# Patient Record
Sex: Male | Born: 1955 | Race: White | Hispanic: No | Marital: Married | Smoking: Never smoker
Health system: Southern US, Community
[De-identification: ages and names within clinical notes are randomized; demographics above are authoritative.]

## PROBLEM LIST (undated history)

## (undated) DIAGNOSIS — I700 Atherosclerosis of aorta: Secondary | ICD-10-CM

## (undated) DIAGNOSIS — I10 Essential (primary) hypertension: Secondary | ICD-10-CM

## (undated) DIAGNOSIS — D509 Iron deficiency anemia, unspecified: Secondary | ICD-10-CM

## (undated) DIAGNOSIS — G4730 Sleep apnea, unspecified: Secondary | ICD-10-CM

## (undated) DIAGNOSIS — E785 Hyperlipidemia, unspecified: Secondary | ICD-10-CM

## (undated) DIAGNOSIS — E1169 Type 2 diabetes mellitus with other specified complication: Secondary | ICD-10-CM

## (undated) DIAGNOSIS — M1990 Unspecified osteoarthritis, unspecified site: Secondary | ICD-10-CM

## (undated) DIAGNOSIS — E559 Vitamin D deficiency, unspecified: Secondary | ICD-10-CM

## (undated) DIAGNOSIS — K219 Gastro-esophageal reflux disease without esophagitis: Secondary | ICD-10-CM

## (undated) HISTORY — DX: Essential (primary) hypertension: I10

## (undated) HISTORY — DX: Sleep apnea, unspecified: G47.30

## (undated) HISTORY — DX: Type 2 diabetes mellitus with other specified complication: E78.5

## (undated) HISTORY — DX: Gastro-esophageal reflux disease without esophagitis: K21.9

## (undated) HISTORY — DX: Atherosclerosis of aorta: I70.0

## (undated) HISTORY — DX: Type 2 diabetes mellitus with other specified complication: E11.69

## (undated) HISTORY — PX: REPLACEMENT TOTAL KNEE: SUR1224

## (undated) HISTORY — PX: TOTAL SHOULDER REPLACEMENT: SUR1217

## (undated) HISTORY — PX: TONSILLECTOMY: SUR1361

## (undated) HISTORY — DX: Iron deficiency anemia, unspecified: D50.9

## (undated) HISTORY — DX: Vitamin D deficiency, unspecified: E55.9

## (undated) HISTORY — PX: JOINT REPLACEMENT: SHX530

## (undated) HISTORY — DX: Morbid (severe) obesity due to excess calories: E66.01

## (undated) HISTORY — DX: Unspecified osteoarthritis, unspecified site: M19.90

---

## 2008-09-13 HISTORY — PX: COLONOSCOPY: SHX174

## 2015-09-14 DIAGNOSIS — G459 Transient cerebral ischemic attack, unspecified: Secondary | ICD-10-CM

## 2015-09-14 HISTORY — DX: Transient cerebral ischemic attack, unspecified: G45.9

## 2019-09-24 NOTE — Progress Notes (Signed)
New patient  Assessment and Plan:  Screen for colon cancer -     Ambulatory referral to Gastroenterology- Internal [place] - patient is over due  Morbid obesity ([place]) - follow up 3 months for progress monitoring - increase veggies, decrease carbs - long discussion about weight loss, diet, and exercise  Type 2 diabetes mellitus with hyperlipidemia (HCC) -     Semaglutide, 1 MG/DOSE, (OZEMPIC, 1 MG/DOSE,) 2 MG/1.5ML SOPN; Inject 1 mg into the skin once a week. Inject into the skin. -     Lipid Profile -     Hemoglobin A1c ([facility]) - will stop the glyburide, start the ozempic  Vitamin D deficiency -     Vitamin D (25 hydroxy)  Gastroesophageal reflux disease with esophagitis without hemorrhage Cut back on meloxicam Weight loss Continue PPI/H2 blocker, diet discussed  Sleep apnea, unspecified type ? Need to try to get mouth piece Weight loss advised  Essential hypertension - continue medications, DASH diet, exercise and monitor at home. Call if greater than 130/80.  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 -     mupirocin cream (BACTROBAN) 2 %; Apply to affected area 3 times daily -     doxycycline (VIBRAMYCIN) 100 MG capsule; Take 1 capsule twice daily with food - with possible secondary infection versus lipodermatosclerosis- will treat as infection- may need ABI/US- patient thinks he  has had something like that will go through paper chart  Chronic right shoulder pain Follows with ortho tomorrow  Follow up for CPE  Discussed med's effects and SE's. Screening labs and tests as requested with regular follow-up as recommended. Over 40 minutes of exam, counseling, chart review and critical decision making was performed  HPI Patient presents to establish as new patient at this practice.  He moved here from [place] with his wife to be closer to his daughter who is a patient here, Mariam Dollar, just adopted twins.   He had right shoulder replacement 2019 in [place], having pain now, has follow up with ortho .   BMI is Body mass index is 47.06 kg/m., he is working on diet and exercise. He has OSA and has tried CPAP and BiPAP, has not been able to have one due to feeling of suffocation. He has edema, has had echo, normal.  Wt Readings from Last 3 Encounters:  09/27/19 (!) 347 lb (157.4 kg)   His blood pressure has been controlled at home, today their BP is BP: 130/74 He does not workout. He denies chest pain, shortness of breath, dizziness.   He is on cholesterol medication and denies myalgias. He has been working on diet and exercise for diabetes, With CKD stage 2 on plavix  With hyperlipidemia on lipitor 80mg  He does not check his sugars.  He is on metformin 1000mg  BID Glyburide 2.5mg  ozmimpic 0.5 mg started due to be able to have his shoulder  insurance.  Glyburide 0.5mg  once.   he is not on bASA Carotid doppler 04/2019 less than 50% Retinal hemorrhages due to DM, due annual exam due 03/2020- 03/2019    Current Medications:  Current Outpatient Medications on File Prior to Visit  Medication Sig Dispense Refill  . atorvastatin (LIPITOR) 80 MG tablet Take 80 mg by mouth.    . Cholecalciferol 125 MCG (5000 UT) TABS Take 5,000 Units by mouth.    . clopidogrel (PLAVIX) 75 MG tablet Take 75 mg by mouth.    . furosemide (LASIX) 40 MG tablet 40 mg.    . meloxicam (MOBIC) 15  MG tablet Take 15 mg by mouth.    . metFORMIN (GLUCOPHAGE) 1000 MG tablet Take 1,000 mg by mouth.    Marland Kitchen omeprazole (PRILOSEC) 40 MG capsule Take 40 mg by mouth.    . valsartan (DIOVAN) 160 MG tablet Take 160 mg by mouth.     No current facility-administered medications on file prior to visit.   Allergies:  Allergies  Allergen Reactions  . 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 Surgical History:  He  has a past surgical history that includes Replacement total knee (Left); Total shoulder replacement (Right); Tonsillectomy; and Colonoscopy (2010). Family History:  His family history is not on file.  Will put in later  Social History:   reports that he has never smoked. He has never used smokeless tobacco. He reports that he does not use drugs. No history on file for alcohol. Review of Systems:  Review of Systems  Constitutional: Negative.   HENT: Negative.   Eyes: Negative.   Respiratory: Positive for shortness of breath (with walking due to body habitus). Negative for cough, hemoptysis, sputum production and wheezing.   Cardiovascular: Positive for claudication and leg swelling. Negative for chest pain, palpitations, orthopnea and PND.  Gastrointestinal: Positive for constipation, diarrhea and heartburn (has hiatal hernia). Negative for abdominal pain, blood in stool, melena, nausea and vomiting.  Genitourinary: Negative.   Musculoskeletal: Positive for back pain,  joint pain and myalgias. Negative for falls and neck pain.  Skin: Positive for rash.  Neurological: Negative.   Endo/Heme/Allergies: Negative.   Psychiatric/Behavioral: Negative.     Physical Exam: Estimated body mass index is 47.06 kg/m as calculated from the following:   Height as of this encounter: 6' (1.829 m).   Weight as of this encounter: 347 lb (157.4 kg). BP 130/74   Pulse (!) 55   Temp 97.7 F (36.5 C)   Ht 6' (1.829 m)   Wt (!) 347 lb (157.4 kg)   SpO2 97%   BMI 47.06 kg/m  General Appearance: Obese WM, in no apparent distress.  Eyes: PERRLA, EOMs, conjunctiva no swelling or erythema, normal fundi and vessels.  Sinuses: No Frontal/maxillary tenderness  ENT/Mouth: Ext aud canals clear, normal light reflex with TMs without erythema, bulging. Mouth and nose not examined- patient wearing a facemask. Hearing normal.  Neck: Supple, thyroid normal. No bruits  Respiratory: Respiratory effort normal, BS decreased due to body habitus but equal bilaterally without rales, rhonchi, wheezing  or stridor.  Cardio: RRR without murmurs, rubs or gallops. Brisk DP but 1+ or barely palpable TP, 1-2+ edema with warm, distinct raised erythematous/bluish papules circumflexing bilateral legs.  Chest: symmetric, with normal excursions and percussion.  Abdomen: Soft, obese, nontender, no guarding, rebound, hernias, masses, or organomegaly.  Lymphatics: Non tender without lymphadenopathy.  Genitourinary: defer Musculoskeletal: Full ROM all peripheral extremities,antalgic gait, decreased movement right shoulder due to pain.  Skin: Warm, dry. Neuro: Cranial nerves intact, reflexes equal bilaterally. Normal muscle tone, no cerebellar symptoms. Sensation intact bilateral feet without ulcers. Marland Kitchen  Psych: Awake and oriented X 3, normal affect, Insight and Judgment appropriate.    Vicie Mutters 1:34 PM [facility]

## 2019-09-27 ENCOUNTER — Other Ambulatory Visit: Payer: Self-pay

## 2019-09-27 ENCOUNTER — Ambulatory Visit (INDEPENDENT_AMBULATORY_CARE_PROVIDER_SITE_OTHER): Payer: 59 | Admitting: Physician Assistant

## 2019-09-27 ENCOUNTER — Encounter: Payer: Self-pay | Admitting: Physician Assistant

## 2019-09-27 VITALS — BP 130/74 | HR 55 | Temp 97.7°F | Ht 72.0 in | Wt 347.0 lb

## 2019-09-27 DIAGNOSIS — G8929 Other chronic pain: Secondary | ICD-10-CM

## 2019-09-27 DIAGNOSIS — E1169 Type 2 diabetes mellitus with other specified complication: Secondary | ICD-10-CM

## 2019-09-27 DIAGNOSIS — E785 Hyperlipidemia, unspecified: Secondary | ICD-10-CM | POA: Insufficient documentation

## 2019-09-27 DIAGNOSIS — I152 Hypertension secondary to endocrine disorders: Secondary | ICD-10-CM | POA: Insufficient documentation

## 2019-09-27 DIAGNOSIS — G4730 Sleep apnea, unspecified: Secondary | ICD-10-CM

## 2019-09-27 DIAGNOSIS — E1122 Type 2 diabetes mellitus with diabetic chronic kidney disease: Secondary | ICD-10-CM | POA: Insufficient documentation

## 2019-09-27 DIAGNOSIS — Z79899 Other long term (current) drug therapy: Secondary | ICD-10-CM | POA: Insufficient documentation

## 2019-09-27 DIAGNOSIS — N182 Chronic kidney disease, stage 2 (mild): Secondary | ICD-10-CM | POA: Insufficient documentation

## 2019-09-27 DIAGNOSIS — K219 Gastro-esophageal reflux disease without esophagitis: Secondary | ICD-10-CM | POA: Insufficient documentation

## 2019-09-27 DIAGNOSIS — I10 Essential (primary) hypertension: Secondary | ICD-10-CM | POA: Diagnosis not present

## 2019-09-27 DIAGNOSIS — E1159 Type 2 diabetes mellitus with other circulatory complications: Secondary | ICD-10-CM | POA: Insufficient documentation

## 2019-09-27 DIAGNOSIS — G4733 Obstructive sleep apnea (adult) (pediatric): Secondary | ICD-10-CM | POA: Insufficient documentation

## 2019-09-27 DIAGNOSIS — Z1211 Encounter for screening for malignant neoplasm of colon: Secondary | ICD-10-CM

## 2019-09-27 DIAGNOSIS — K2100 Gastro-esophageal reflux disease with esophagitis, without bleeding: Secondary | ICD-10-CM | POA: Diagnosis not present

## 2019-09-27 DIAGNOSIS — E559 Vitamin D deficiency, unspecified: Secondary | ICD-10-CM

## 2019-09-27 DIAGNOSIS — E0822 Diabetes mellitus due to underlying condition with diabetic chronic kidney disease: Secondary | ICD-10-CM

## 2019-09-27 DIAGNOSIS — I872 Venous insufficiency (chronic) (peripheral): Secondary | ICD-10-CM | POA: Insufficient documentation

## 2019-09-27 DIAGNOSIS — M25511 Pain in right shoulder: Secondary | ICD-10-CM

## 2019-09-27 DIAGNOSIS — Z794 Long term (current) use of insulin: Secondary | ICD-10-CM

## 2019-09-27 MED ORDER — MUPIROCIN CALCIUM 2 % EX CREA: TOPICAL_CREAM | CUTANEOUS | 1 refills | Status: DC

## 2019-09-27 MED ORDER — OZEMPIC (1 MG/DOSE) 2 MG/1.5ML ~~LOC~~ SOPN: 1.0000 mg | PEN_INJECTOR | SUBCUTANEOUS | 3 refills | Status: DC

## 2019-09-27 MED ORDER — TRIAMCINOLONE ACETONIDE 0.1 % EX OINT: 1.0000 "application " | TOPICAL_OINTMENT | Freq: Two times a day (BID) | CUTANEOUS | 1 refills | Status: DC

## 2019-09-27 MED ORDER — DOXYCYCLINE HYCLATE 100 MG PO CAPS: ORAL_CAPSULE | ORAL | 0 refills | Status: DC

## 2019-09-27 NOTE — Patient Instructions (Signed)
SGLT-2  CI/SE avoid in A1C 9% - increased risk of UTIs/mycotic infections   Farxiga -  only one with primary and secondary CHF prevention  Reduced hospitalizations - 10 mg dose only, NOT 5 mg Decreased cardiovascular death, secondary CVD DM GFR <45 NO, ok for GFR 30 if NOT DM Free commercial card, applies to deductible   Jardiance -  2ndary CVD death reduced 38% with established T2DM and CVD Pending CHF indication approval  Improved coverage with commercial medicare - 97% coverage - tier 2 or 3 with medicare advantage plans Discontinue GFR <45%  Invokana -  Lowers risk of MI, CVD, CVA, death in T2DM with established vascular disease  Reduces CKD progression in CKD with albuminuria 300 GFR 30-60 100 mg daily dose, NOT 300 mg ^ risk of amputations  Steglatro - No CVD data but cheaper and applies to deductible    GLP-1

## 2019-09-28 LAB — TSH: TSH: 0.99 mIU/L (ref 0.40–4.50)

## 2019-09-28 LAB — CBC WITH DIFFERENTIAL/PLATELET
Absolute Monocytes: 661 cells/uL (ref 200–950)
Basophils Absolute: 70 cells/uL (ref 0–200)
Basophils Relative: 0.8 %
Eosinophils Absolute: 278 cells/uL (ref 15–500)
Eosinophils Relative: 3.2 %
HCT: 40 % (ref 38.5–50.0)
Hemoglobin: 12.7 g/dL — ABNORMAL LOW (ref 13.2–17.1)
Lymphs Abs: 1714 cells/uL (ref 850–3900)
MCH: 23.9 pg — ABNORMAL LOW (ref 27.0–33.0)
MCHC: 31.8 g/dL — ABNORMAL LOW (ref 32.0–36.0)
MCV: 75.2 fL — ABNORMAL LOW (ref 80.0–100.0)
MPV: 9.2 fL (ref 7.5–12.5)
Monocytes Relative: 7.6 %
Neutro Abs: 5977 cells/uL (ref 1500–7800)
Neutrophils Relative %: 68.7 %
Platelets: 349 10*3/uL (ref 140–400)
RBC: 5.32 10*6/uL (ref 4.20–5.80)
RDW: 15.5 % — ABNORMAL HIGH (ref 11.0–15.0)
Total Lymphocyte: 19.7 %
WBC: 8.7 10*3/uL (ref 3.8–10.8)

## 2019-09-28 LAB — COMPLETE METABOLIC PANEL WITH GFR
AG Ratio: 1.6 (calc) (ref 1.0–2.5)
ALT: 24 U/L (ref 9–46)
AST: 22 U/L (ref 10–35)
Albumin: 4.5 g/dL (ref 3.6–5.1)
Alkaline phosphatase (APISO): 90 U/L (ref 35–144)
BUN: 24 mg/dL (ref 7–25)
CO2: 28 mmol/L (ref 20–32)
Calcium: 9.8 mg/dL (ref 8.6–10.3)
Chloride: 102 mmol/L (ref 98–110)
Creat: 1.14 mg/dL (ref 0.70–1.25)
GFR, Est African American: 79 mL/min/{1.73_m2} (ref >=60)
GFR, Est Non African American: 68 mL/min/{1.73_m2} (ref >=60)
Globulin: 2.8 g/dL (calc) (ref 1.9–3.7)
Glucose, Bld: 82 mg/dL (ref 65–99)
Potassium: 5 mmol/L (ref 3.5–5.3)
Sodium: 139 mmol/L (ref 135–146)
Total Bilirubin: 0.4 mg/dL (ref 0.2–1.2)
Total Protein: 7.3 g/dL (ref 6.1–8.1)

## 2019-09-28 LAB — LIPID PANEL
Cholesterol: 162 mg/dL (ref <200)
HDL: 35 mg/dL — ABNORMAL LOW (ref >=40)
LDL Cholesterol (Calc): 94 mg/dL (calc)
Non-HDL Cholesterol (Calc): 127 mg/dL (calc) (ref <130)
Total CHOL/HDL Ratio: 4.6 (calc) (ref <5.0)
Triglycerides: 237 mg/dL — ABNORMAL HIGH (ref <150)

## 2019-09-28 LAB — HEMOGLOBIN A1C
Hgb A1c MFr Bld: 6.8 % of total Hgb — ABNORMAL HIGH (ref <5.7)
Mean Plasma Glucose: 148 (calc)
eAG (mmol/L): 8.2 (calc)

## 2019-09-28 LAB — VITAMIN D 25 HYDROXY (VIT D DEFICIENCY, FRACTURES): Vit D, 25-Hydroxy: 29 ng/mL — ABNORMAL LOW (ref 30–100)

## 2019-09-28 LAB — MAGNESIUM: Magnesium: 2.1 mg/dL (ref 1.5–2.5)

## 2019-09-30 ENCOUNTER — Other Ambulatory Visit: Payer: Self-pay | Admitting: Physician Assistant

## 2019-09-30 MED ORDER — EZETIMIBE 10 MG PO TABS: 10.0000 mg | ORAL_TABLET | Freq: Every day | ORAL | 1 refills | Status: DC

## 2019-11-02 ENCOUNTER — Telehealth: Payer: Self-pay | Admitting: Physician Assistant

## 2019-11-02 MED ORDER — CARVEDILOL 12.5 MG PO TABS: 12.5000 mg | ORAL_TABLET | Freq: Two times a day (BID) | ORAL | 1 refills | Status: DC

## 2019-11-02 NOTE — Telephone Encounter (Signed)
-----   Message from Elenor Quinones, [place] sent at 11/02/2019  9:32 AM EST ----- Regarding: pharmacy note Contact: [phone] Pt has requested an rx for CARVEDILOL   Dustin Ayala

## 2019-11-13 ENCOUNTER — Other Ambulatory Visit: Payer: Self-pay | Admitting: Physician Assistant

## 2019-11-13 DIAGNOSIS — E1169 Type 2 diabetes mellitus with other specified complication: Secondary | ICD-10-CM

## 2019-11-13 DIAGNOSIS — I872 Venous insufficiency (chronic) (peripheral): Secondary | ICD-10-CM

## 2019-11-13 DIAGNOSIS — I10 Essential (primary) hypertension: Secondary | ICD-10-CM

## 2019-11-13 DIAGNOSIS — E0822 Diabetes mellitus due to underlying condition with diabetic chronic kidney disease: Secondary | ICD-10-CM

## 2019-11-13 DIAGNOSIS — N182 Chronic kidney disease, stage 2 (mild): Secondary | ICD-10-CM

## 2019-11-13 DIAGNOSIS — Z794 Long term (current) use of insulin: Secondary | ICD-10-CM

## 2019-11-13 DIAGNOSIS — E785 Hyperlipidemia, unspecified: Secondary | ICD-10-CM

## 2019-11-15 ENCOUNTER — Emergency Department (HOSPITAL_COMMUNITY): Payer: 59

## 2019-11-15 ENCOUNTER — Observation Stay (HOSPITAL_COMMUNITY)
Admission: EM | Admit: 2019-11-15 | Discharge: 2019-11-16 | Disposition: A | Discharge status: Home / Self Care | Payer: 59 | Attending: Family Medicine | Admitting: Family Medicine

## 2019-11-15 ENCOUNTER — Other Ambulatory Visit: Payer: Self-pay

## 2019-11-15 ENCOUNTER — Encounter (HOSPITAL_COMMUNITY): Payer: Self-pay

## 2019-11-15 DIAGNOSIS — K611 Rectal abscess: Principal | ICD-10-CM

## 2019-11-15 DIAGNOSIS — E785 Hyperlipidemia, unspecified: Secondary | ICD-10-CM | POA: Diagnosis present

## 2019-11-15 DIAGNOSIS — K6289 Other specified diseases of anus and rectum: Secondary | ICD-10-CM | POA: Diagnosis present

## 2019-11-15 DIAGNOSIS — I10 Essential (primary) hypertension: Secondary | ICD-10-CM | POA: Diagnosis not present

## 2019-11-15 DIAGNOSIS — Z20822 Contact with and (suspected) exposure to covid-19: Secondary | ICD-10-CM | POA: Insufficient documentation

## 2019-11-15 DIAGNOSIS — R195 Other fecal abnormalities: Secondary | ICD-10-CM | POA: Diagnosis present

## 2019-11-15 DIAGNOSIS — Z79899 Other long term (current) drug therapy: Secondary | ICD-10-CM | POA: Diagnosis not present

## 2019-11-15 DIAGNOSIS — D72829 Elevated white blood cell count, unspecified: Secondary | ICD-10-CM

## 2019-11-15 DIAGNOSIS — E0822 Diabetes mellitus due to underlying condition with diabetic chronic kidney disease: Secondary | ICD-10-CM | POA: Diagnosis not present

## 2019-11-15 DIAGNOSIS — D509 Iron deficiency anemia, unspecified: Secondary | ICD-10-CM

## 2019-11-15 DIAGNOSIS — I872 Venous insufficiency (chronic) (peripheral): Secondary | ICD-10-CM | POA: Diagnosis present

## 2019-11-15 DIAGNOSIS — N182 Chronic kidney disease, stage 2 (mild): Secondary | ICD-10-CM

## 2019-11-15 DIAGNOSIS — K219 Gastro-esophageal reflux disease without esophagitis: Secondary | ICD-10-CM | POA: Diagnosis present

## 2019-11-15 DIAGNOSIS — E1159 Type 2 diabetes mellitus with other circulatory complications: Secondary | ICD-10-CM | POA: Diagnosis present

## 2019-11-15 DIAGNOSIS — Z7984 Long term (current) use of oral hypoglycemic drugs: Secondary | ICD-10-CM | POA: Diagnosis not present

## 2019-11-15 DIAGNOSIS — K760 Fatty (change of) liver, not elsewhere classified: Secondary | ICD-10-CM

## 2019-11-15 DIAGNOSIS — E1169 Type 2 diabetes mellitus with other specified complication: Secondary | ICD-10-CM | POA: Diagnosis present

## 2019-11-15 DIAGNOSIS — R160 Hepatomegaly, not elsewhere classified: Secondary | ICD-10-CM

## 2019-11-15 DIAGNOSIS — E1122 Type 2 diabetes mellitus with diabetic chronic kidney disease: Secondary | ICD-10-CM

## 2019-11-15 DIAGNOSIS — L0231 Cutaneous abscess of buttock: Secondary | ICD-10-CM | POA: Diagnosis present

## 2019-11-15 HISTORY — DX: Fatty (change of) liver, not elsewhere classified: K76.0

## 2019-11-15 HISTORY — DX: Hepatomegaly, not elsewhere classified: R16.0

## 2019-11-15 LAB — CBC WITH DIFFERENTIAL/PLATELET
Abs Immature Granulocytes: 0.07 10*3/uL (ref 0.00–0.07)
Basophils Absolute: 0.1 10*3/uL (ref 0.0–0.1)
Basophils Relative: 0 %
Eosinophils Absolute: 0.1 10*3/uL (ref 0.0–0.5)
Eosinophils Relative: 1 %
HCT: 38.2 % — ABNORMAL LOW (ref 39.0–52.0)
Hemoglobin: 11.9 g/dL — ABNORMAL LOW (ref 13.0–17.0)
Immature Granulocytes: 0 %
Lymphocytes Relative: 9 %
Lymphs Abs: 1.5 10*3/uL (ref 0.7–4.0)
MCH: 24.1 pg — ABNORMAL LOW (ref 26.0–34.0)
MCHC: 31.2 g/dL (ref 30.0–36.0)
MCV: 77.5 fL — ABNORMAL LOW (ref 80.0–100.0)
Monocytes Absolute: 1.5 10*3/uL — ABNORMAL HIGH (ref 0.1–1.0)
Monocytes Relative: 9 %
Neutro Abs: 12.8 10*3/uL — ABNORMAL HIGH (ref 1.7–7.7)
Neutrophils Relative %: 81 %
Platelets: 311 10*3/uL (ref 150–400)
RBC: 4.93 MIL/uL (ref 4.22–5.81)
RDW: 16.1 % — ABNORMAL HIGH (ref 11.5–15.5)
WBC: 16.1 10*3/uL — ABNORMAL HIGH (ref 4.0–10.5)
nRBC: 0 % (ref 0.0–0.2)

## 2019-11-15 LAB — URINALYSIS, ROUTINE W REFLEX MICROSCOPIC
Bilirubin Urine: NEGATIVE
Glucose, UA: NEGATIVE mg/dL
Hgb urine dipstick: NEGATIVE
Ketones, ur: NEGATIVE mg/dL
Leukocytes,Ua: NEGATIVE
Nitrite: NEGATIVE
Protein, ur: NEGATIVE mg/dL
Specific Gravity, Urine: 1.02 (ref 1.005–1.030)
pH: 6 (ref 5.0–8.0)

## 2019-11-15 LAB — COMPREHENSIVE METABOLIC PANEL
ALT: 22 U/L (ref 0–44)
AST: 15 U/L (ref 15–41)
Albumin: 3.6 g/dL (ref 3.5–5.0)
Alkaline Phosphatase: 84 U/L (ref 38–126)
Anion gap: 11 (ref 5–15)
BUN: 23 mg/dL (ref 8–23)
CO2: 23 mmol/L (ref 22–32)
Calcium: 8.8 mg/dL — ABNORMAL LOW (ref 8.9–10.3)
Chloride: 103 mmol/L (ref 98–111)
Creatinine, Ser: 1.05 mg/dL (ref 0.61–1.24)
GFR calc Af Amer: >60 mL/min (ref >60)
GFR calc non Af Amer: >60 mL/min (ref >60)
Glucose, Bld: 114 mg/dL — ABNORMAL HIGH (ref 70–99)
Potassium: 4 mmol/L (ref 3.5–5.1)
Sodium: 137 mmol/L (ref 135–145)
Total Bilirubin: 0.9 mg/dL (ref 0.3–1.2)
Total Protein: 7.4 g/dL (ref 6.5–8.1)

## 2019-11-15 LAB — GLUCOSE, CAPILLARY: Glucose-Capillary: 127 mg/dL — ABNORMAL HIGH (ref 70–99)

## 2019-11-15 LAB — IRON AND TIBC
Iron: 27 ug/dL — ABNORMAL LOW (ref 45–182)
Saturation Ratios: 7 % — ABNORMAL LOW (ref 17.9–39.5)
TIBC: 388 ug/dL (ref 250–450)
UIBC: 361 ug/dL

## 2019-11-15 LAB — POC OCCULT BLOOD, ED: Fecal Occult Bld: POSITIVE — AB

## 2019-11-15 LAB — LIPASE, BLOOD: Lipase: 27 U/L (ref 11–51)

## 2019-11-15 LAB — FERRITIN: Ferritin: 69 ng/mL (ref 24–336)

## 2019-11-15 LAB — LACTIC ACID, PLASMA: Lactic Acid, Venous: 1.4 mmol/L (ref 0.5–1.9)

## 2019-11-15 LAB — SARS CORONAVIRUS 2 (TAT 6-24 HRS): SARS Coronavirus 2: NEGATIVE

## 2019-11-15 MED ORDER — EZETIMIBE 10 MG PO TABS
10.0000 mg | ORAL_TABLET | Freq: Every day | ORAL | Status: DC
  Administered 2019-11-16: 10 mg via ORAL
  Filled 2019-11-15: qty 1

## 2019-11-15 MED ORDER — METRONIDAZOLE 500 MG PO TABS
500.0000 mg | ORAL_TABLET | Freq: Three times a day (TID) | ORAL | Status: DC
  Administered 2019-11-15 – 2019-11-16 (×2): 500 mg via ORAL
  Filled 2019-11-15 (×3): qty 1

## 2019-11-15 MED ORDER — AMOXICILLIN-POT CLAVULANATE 875-125 MG PO TABS: 1.0000 | ORAL_TABLET | Freq: Two times a day (BID) | ORAL | Status: DC

## 2019-11-15 MED ORDER — TRAMADOL HCL 50 MG PO TABS
50.0000 mg | ORAL_TABLET | Freq: Four times a day (QID) | ORAL | Status: DC | PRN
  Administered 2019-11-15 – 2019-11-16 (×2): 50 mg via ORAL
  Filled 2019-11-15 (×3): qty 1

## 2019-11-15 MED ORDER — HEPARIN SODIUM (PORCINE) 5000 UNIT/ML IJ SOLN
5000.0000 [IU] | Freq: Three times a day (TID) | INTRAMUSCULAR | Status: DC
  Filled 2019-11-15: qty 1

## 2019-11-15 MED ORDER — FENTANYL CITRATE (PF) 100 MCG/2ML IJ SOLN
25.0000 ug | Freq: Once | INTRAMUSCULAR | Status: AC
  Administered 2019-11-15: 25 ug via INTRAVENOUS
  Filled 2019-11-15: qty 2

## 2019-11-15 MED ORDER — PIPERACILLIN-TAZOBACTAM 3.375 G IVPB 30 MIN
3.3750 g | Freq: Once | INTRAVENOUS | Status: AC
  Administered 2019-11-15: 3.375 g via INTRAVENOUS
  Filled 2019-11-15: qty 50

## 2019-11-15 MED ORDER — IRBESARTAN 150 MG PO TABS
150.0000 mg | ORAL_TABLET | Freq: Every day | ORAL | Status: DC
  Administered 2019-11-15 – 2019-11-16 (×2): 150 mg via ORAL
  Filled 2019-11-15 (×2): qty 1

## 2019-11-15 MED ORDER — DOXYCYCLINE HYCLATE 100 MG PO TABS
100.0000 mg | ORAL_TABLET | Freq: Two times a day (BID) | ORAL | Status: DC
  Administered 2019-11-15 – 2019-11-16 (×2): 100 mg via ORAL
  Filled 2019-11-15 (×2): qty 1

## 2019-11-15 MED ORDER — ONDANSETRON HCL 4 MG PO TABS: 4.0000 mg | ORAL_TABLET | Freq: Four times a day (QID) | ORAL | Status: DC | PRN

## 2019-11-15 MED ORDER — ACETAMINOPHEN 650 MG RE SUPP: 650.0000 mg | Freq: Four times a day (QID) | RECTAL | Status: DC | PRN

## 2019-11-15 MED ORDER — CARVEDILOL 25 MG PO TABS
25.0000 mg | ORAL_TABLET | Freq: Two times a day (BID) | ORAL | Status: DC
  Administered 2019-11-15 – 2019-11-16 (×2): 25 mg via ORAL
  Filled 2019-11-15 (×2): qty 1

## 2019-11-15 MED ORDER — TRIAMCINOLONE ACETONIDE 0.1 % EX OINT: 1.0000 "application " | TOPICAL_OINTMENT | Freq: Every day | CUTANEOUS | Status: DC | PRN

## 2019-11-15 MED ORDER — SODIUM CHLORIDE (PF) 0.9 % IJ SOLN
INTRAMUSCULAR | Status: AC
  Filled 2019-11-15: qty 50

## 2019-11-15 MED ORDER — SODIUM CHLORIDE 0.9 % IV BOLUS
1000.0000 mL | Freq: Once | INTRAVENOUS | Status: AC
  Administered 2019-11-15: 1000 mL via INTRAVENOUS

## 2019-11-15 MED ORDER — CLOPIDOGREL BISULFATE 75 MG PO TABS
75.0000 mg | ORAL_TABLET | Freq: Every day | ORAL | Status: DC
  Administered 2019-11-15 – 2019-11-16 (×2): 75 mg via ORAL
  Filled 2019-11-15 (×2): qty 1

## 2019-11-15 MED ORDER — ONDANSETRON HCL 4 MG/2ML IJ SOLN: 4.0000 mg | Freq: Four times a day (QID) | INTRAMUSCULAR | Status: DC | PRN

## 2019-11-15 MED ORDER — INSULIN ASPART 100 UNIT/ML ~~LOC~~ SOLN: 0.0000 [IU] | Freq: Three times a day (TID) | SUBCUTANEOUS | Status: DC

## 2019-11-15 MED ORDER — SENNOSIDES-DOCUSATE SODIUM 8.6-50 MG PO TABS: 1.0000 | ORAL_TABLET | Freq: Every evening | ORAL | Status: DC | PRN

## 2019-11-15 MED ORDER — PANTOPRAZOLE SODIUM 40 MG PO TBEC
40.0000 mg | DELAYED_RELEASE_TABLET | Freq: Every day | ORAL | Status: DC
  Administered 2019-11-16: 40 mg via ORAL
  Filled 2019-11-15 (×2): qty 1

## 2019-11-15 MED ORDER — ACETAMINOPHEN 325 MG PO TABS: 650.0000 mg | ORAL_TABLET | Freq: Four times a day (QID) | ORAL | Status: DC | PRN

## 2019-11-15 MED ORDER — SODIUM CHLORIDE 0.9 % IV SOLN: INTRAVENOUS | Status: DC

## 2019-11-15 MED ORDER — IOHEXOL 300 MG/ML  SOLN
100.0000 mL | Freq: Once | INTRAMUSCULAR | Status: AC | PRN
  Administered 2019-11-15: 100 mL via INTRAVENOUS

## 2019-11-15 MED ORDER — ATORVASTATIN CALCIUM 40 MG PO TABS: 80.0000 mg | ORAL_TABLET | Freq: Every day | ORAL | Status: DC

## 2019-11-15 MED ORDER — LEVOFLOXACIN 500 MG PO TABS
500.0000 mg | ORAL_TABLET | Freq: Every day | ORAL | Status: DC
  Administered 2019-11-15: 500 mg via ORAL
  Filled 2019-11-15: qty 1

## 2019-11-15 NOTE — H&P (Addendum)
History and Physical  Sig Clisham [id number] DOB: Dec 08, 1955 DOA: 11/15/2019  Referring physician: Dr. Carmin Muskrat PCP: Unk Pinto, MD  Patient coming from: Home   Chief Complaint: Right buttocks pain  HPI: Dustin Ayala is a 64 y.o. male with medical history significant for severe obesity, type 2 diabetes, HTN, HLD, CKD stage II who presents on 11/15/2019 with 3 days of progressively worsening right buttocks pain.    Started noticing pain and irritation on the right buttocks that he thought was an ingrown hair about 3 days prior to admission. He had his wife take a look but she didn't could not see any lesions. For the last 2 days the pain had severely worsened to the point of from feeling worsening weakness which prompted him to come to the ED.  While in the ED sitting in the waiting room it spontaneously burst and he immediately felt much better.  Denies any hematochezia or melena, fevers or chills.   He takes Lasix every morning for swelling in both legs  Takes metformin, glyburide, and metformin for his diabetes He also takes carvedilol and irbesartan to control his blood pressure He is also adherent to Zetia and atorvastatin for high cholesterol   ED Course:  T-max 98.2, normal respiratory rate, normal oxygen saturation, hemodynamically stable. Covid test pending during ED evaluation CMP unremarkable Paced 27 Fecal occult blood positive, WBC 16.1, hemoglobin 11.9 (baseline around 12.7), MCV 77.5 UA unremarkable Lactic acid 1.4  Underwent CT abdomen which showed inflammatory changes around the inferior right gluteal crease with no identifiable fluid collection.  Surgery consulted in the ED for assessment of right gluteal crease abscess which at that point had spontaneously drained and agree with medical management with IV antibiotics given elevated WBC and no current requirement for surgical I&D. He received IV zosyn in the ED and [facility] was  called patient was admitted as observation.   Review of Systems:As mentioned in the history of present illness.Review of systems are otherwise negative Patient seen in the ED.   Past Medical History:  Diagnosis Date  . GERD (gastroesophageal reflux disease)   . Hypertension   . Morbid obesity ([place])   . Sleep apnea   . Type 2 diabetes mellitus with hyperlipidemia ([place])   . Vitamin D deficiency    Past Surgical History:  Procedure Laterality Date  . COLONOSCOPY  2010   no family history  . REPLACEMENT TOTAL KNEE Left    BACK IN 2010  . TONSILLECTOMY    . TOTAL SHOULDER REPLACEMENT Right    8.21.2018   Allergies  Allergen Reactions  . Sulfamethoxazole-Trimethoprim Rash   Social History:  reports that he has never smoked. He has never used smokeless tobacco. He reports that he does not use drugs. No history on file for alcohol. Family History  Problem Relation Age of Onset  . Prostate cancer Father   . COPD Sister         Prior to Admission medications   Medication Sig Start Date End Date Taking? Authorizing Provider  atorvastatin (LIPITOR) 80 MG tablet Take 1 tablet Daily for Cholesterol Patient taking differently: Take 80 mg by mouth daily at 6 PM.  11/13/19  Yes Unk Pinto, MD  carvedilol (COREG) 25 MG tablet Take 25 mg by mouth 2 (two) times daily with a meal.   Yes [provider]  Cholecalciferol 125 MCG (5000 UT) TABS Take 5,000 Units by mouth.   Yes [provider]  clopidogrel (PLAVIX) 75 MG  tablet Take 75 mg by mouth. 01/18/18  Yes [provider]  ezetimibe (ZETIA) 10 MG tablet Take 1 tablet (10 mg total) by mouth daily. 09/30/19 09/29/20 Yes Vicie Mutters, PA-C  furosemide (LASIX) 40 MG tablet Take 1 tablet 2 x /day for BP & Fluid Retention / Ankle Swelling Patient taking differently: Take 20 mg by mouth daily.  11/13/19  Yes Unk Pinto, MD  meloxicam (MOBIC) 15 MG tablet Take 15 mg by mouth.   Yes [provider]    metFORMIN (GLUCOPHAGE) 1000 MG tablet Take 1,000 mg by mouth 2 (two) times daily with a meal.    Yes [provider]  omeprazole (PRILOSEC) 40 MG capsule Take 40 mg by mouth.   Yes [provider]  Semaglutide, 1 MG/DOSE, (OZEMPIC, 1 MG/DOSE,) 2 MG/1.5ML SOPN Inject 1 mg into the skin once a week. Inject into the skin. 09/27/19  Yes Vicie Mutters, PA-C  triamcinolone ointment (KENALOG) 0.1 % Apply 1 application topically 2 (two) times daily. Patient taking differently: Apply 1 application topically daily as needed (rash).  09/27/19  Yes Vicie Mutters, PA-C  valsartan (DIOVAN) 160 MG tablet Take 1 tablet 2 x  /day for BP Patient taking differently: Take 160 mg by mouth 2 (two) times daily.  11/13/19  Yes Unk Pinto, MD  carvedilol (COREG) 12.5 MG tablet Take 1 tablet (12.5 mg total) by mouth 2 (two) times daily. Patient not taking: Reported on 11/15/2019 11/02/19 11/01/20  Vicie Mutters, PA-C  Coenzyme Q10 200 MG capsule Take 200 mg by mouth daily before breakfast.    [provider]  doxycycline (VIBRAMYCIN) 100 MG capsule Take 1 capsule twice daily with food Patient not taking: Reported on 11/15/2019 09/27/19   Vicie Mutters, PA-C  glyBURIDE (DIABETA) 2.5 MG tablet Take 1 tablet Daily with Breakfast for Diabetes Patient not taking: Reported on 11/15/2019 11/13/19   Unk Pinto, MD  mupirocin cream Drue Stager) 2 % Apply to affected area 3 times daily Patient not taking: Reported on 11/15/2019 09/27/19 09/26/20  Vicie Mutters, PA-C    Physical Exam: BP (!) 161/70   Pulse 60   Temp 98.2 F (36.8 C) (Oral)   Resp 20   Ht 5\' 10"  (1.778 m)   Wt (!) 154.2 kg   SpO2 95%   BMI 48.78 kg/m   Constitutional nontoxic-appearing male Eyes: EOMI, anicteric, normal conjunctivae ENMT: Oropharynx with moist mucous membranes, normal dentition Cardiovascular: RRR no MRGs, with no peripheral edema Respiratory: Normal respiratory effort on room air, clear breath sounds   Abdomen: Soft,non-tender, obese abdomen Skin: No visualized abscess or skin lesions, difficult to assess gluteal area given body habitus, some mild serosanguineous drainage near gluteal area and slightly tender to palpation near the right perineum and gluteal crease meets Neurologic: Grossly no focal neuro deficit. Psychiatric:Appropriate affect, and mood. Mental status AAOx3          Labs on Admission:  Basic Metabolic Panel: Recent Labs  Lab 11/15/19 1409  NA 137  K 4.0  CL 103  CO2 23  GLUCOSE 114*  BUN 23  CREATININE 1.05  CALCIUM 8.8*   Liver Function Tests: Recent Labs  Lab 11/15/19 1409  AST 15  ALT 22  ALKPHOS 84  BILITOT 0.9  PROT 7.4  ALBUMIN 3.6   Recent Labs  Lab 11/15/19 1409  LIPASE 27   No results for input(s): AMMONIA in the last 168 hours. CBC: Recent Labs  Lab 11/15/19 1155  WBC 16.1*  NEUTROABS 12.8*  HGB  11.9*  HCT 38.2*  MCV 77.5*  PLT 311   Cardiac Enzymes: No results for input(s): CKTOTAL, CKMB, CKMBINDEX, TROPONINI in the last 168 hours.  BNP (last 3 results) No results for input(s): BNP in the last 8760 hours.  ProBNP (last 3 results) No results for input(s): PROBNP in the last 8760 hours.  CBG: No results for input(s): GLUCAP in the last 168 hours.  Radiological Exams on Admission: CT ABDOMEN PELVIS W CONTRAST  Result Date: 11/15/2019 CLINICAL DATA:  Rectal pain with blood and purulence near rectum. EXAM: CT ABDOMEN AND PELVIS WITH CONTRAST TECHNIQUE: Multidetector CT imaging of the abdomen and pelvis was performed using the standard protocol following bolus administration of intravenous contrast. CONTRAST:  172mL OMNIPAQUE IOHEXOL 300 MG/ML  SOLN COMPARISON:  None. FINDINGS: Lower chest: Subsegmental atelectasis or scarring in both lower lobes. No pleural fluid. Calcified lymph node in the inferior left hilum. Calcified granuloma in the left lower lobe. Upper normal heart size. No pericardial effusion. Hepatobiliary: Enlarged  liver spanning 24 cm cranial caudal. Diffuse hepatic steatosis. Few scattered calcified granuloma. No evidence of focal hepatic lesion. Gallbladder physiologically distended, no calcified stone. No biliary dilatation. Pancreas: No ductal dilatation or inflammation. Spleen: Normal in size. Scattered calcified granuloma. Adrenals/Urinary Tract: Normal adrenal glands. No hydronephrosis or perinephric edema. Homogeneous renal enhancement with symmetric excretion on delayed phase imaging. 2.2 cm low-density lesion in the lower right kidney is likely cyst, evaluation partially obscured by streak artifact related to habitus abutting the CT gantry. Urinary bladder is near completely empty. Stomach/Bowel: Inflammatory changes about the inferior right gluteal crease with small focus of air, series 3, image 5, no drainable fluid collection. There is no definite connection to the anus or rectum. No rectal wall thickening. Small volume of colonic stool with scattered diverticulosis. No diverticulitis. No small bowel obstruction or inflammation. Diminutive appendix tentatively visualized. No evidence of appendicitis. Vascular/Lymphatic: Mild aortic atherosclerosis. No aortic aneurysm. No evidence of portal vein thrombosis. No enlarged lymph nodes in the abdomen or pelvis. Reproductive: Prostate is unremarkable. Other: Inflammatory changes about the inferior right gluteal crease is small focus of air, best appreciated on series 3, image 5. No focal fluid collection. No intra-abdominopelvic fluid collection. No free air free fluid. Tiny fat containing umbilical hernia. Fat in both inguinal canals. Musculoskeletal: Multilevel degenerative change in the spine with degenerative disc disease and facet hypertrophy. There are no acute or suspicious osseous abnormalities. IMPRESSION: 1. Inflammatory changes about the inferior right gluteal crease with small focus of air, no drainable fluid collection. No definite connection to the anus or  rectum. 2. No other acute findings in the abdomen/pelvis. Incidental findings of hepatomegaly and hepatic steatosis. Colonic diverticulosis without diverticulitis. Aortic Atherosclerosis (ICD10-I70.0). Electronically Signed   By: Keith Rake M.D.   On: 11/15/2019 15:36     Assessment/Plan Present on Admission: . Abscess, gluteal, right . Leukocytosis . Hypertension . Morbid obesity ([place]) . Type 2 diabetes mellitus with hyperlipidemia ([place]) . Venous insufficiency . 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 Cover for MRSA with oral doxycycline and gram neg/anerobe coverage given location with flagyl and levaquin , encouraged CT abdomen shows no drainable fluid collection and only inflammatory changes and patient subjectively feeling better with spontaneous drainage. S/p IV zosyn in ED - Switch to Doxycycline, flagyl, levaquin  -Blood cultures if becomes febrile --Anticipate being able to discharge if remains afebrile, leukocytosis improving, continues to drain without reaccumulation of fluid -Surgery consulted and following to ensure continues to spontaneously drain, no surgical intervention currently  Leukocytosis  In the setting of abscess that is now spontaneously draining.  Otherwise not tachypneic, no tachycardia, hemodynamically stable, no sepsis physiology.  Anticipate improvement -Repeat CBC in  a.m.  Positive fecal occult blood test Microcytic anemia, present on admission Denies any melena, hematochezia to me though he reported some bleeding per rectum to ED provider. Could be related to irritation from abscess as drainage is serosanguineous. His MCV is low too concerning for iron deficiency anemia --check iron panel --repeat CBC in am  Severe obesity BMI 48  T2DM, well controlled, A1c 6% on 1/21 -Holding home oral hypoglycemics -Monitor CBGs, diabetic diet, sliding scale as needed  CKD stage II, stable Unclear creatinine baseline, currently 1.05 -Avoid nephrotoxins, repeat BMP in a.m. continue home ARB  Hypertension, slightly elevated Elevated SBP's in the 150s in setting of not taking home regimen prior to admission Given no signs of sepsis and abscess seems to resolving on its own we will continue blood pressure regimen -Continue home carvedilol 25 mg twice daily, irbesartan in place of home  HLD -Continue home Zetia, atorvastatin     DVT prophylaxis: Heparin subcu  Code Status: Full code  Family Communication: His wife was not present at bedside, he will update her.  Disposition Plan: Patient be admitted as observation with doxycycline, levaquin, flagyl while allowing abscess to continue to drain with close monitoring of leukocytosis, fever and continued abscess drainage.  He does continue to improve anticipate being able to discharge in 24 hours on oral antibiotics, also pending follow-up surgical evaluation to ensure no surgical intervention needed  Consults called: Surgery (EDP)  Admission status: Admitted as observation to med-surge unit.      Desiree Hane MD [facility]  Pager [phone]  If 7PM-7AM, please contact night-coverage [email or web address] Password [facility]  11/15/2019, 5:52 PM

## 2019-11-15 NOTE — ED Provider Notes (Signed)
[facility] DEPT Provider Note   CSN: [id number] Arrival date & time: 11/15/19  1034     History Chief Complaint  Patient presents with  . Rectal Pain    Dustin Ayala is a 64 y.o. male.  HPI    Patient presents with rectal pain, bleeding.  Patient notes history of diabetes, hypertension.  He has no history of similar rectal issues.  He notes that over the past 2 or 3 days he has had increasing pain in his rectum, that began without clear precipitant.  With increasing pain he had a slight evaluate his area, and she reported to him that there were no notable abnormalities.  Today, after arriving for evaluation he noticed that he had bleeding from his rectum as well.  He denies fever, states that he checks his temperature regularly.  He does note an episode of hypotension yesterday, but without similar values today.  He denies lightheadedness, syncope, chest pain, dyspnea.  Also denies abdominal pain.  Rectal pain is severe.  No relief with anything.   Past Medical History:  Diagnosis Date  . GERD (gastroesophageal reflux disease)   . Hypertension   . Morbid obesity ([place])   . Sleep apnea   . Type 2 diabetes mellitus with hyperlipidemia ([place])   . Vitamin D deficiency     Patient Active Problem List   Diagnosis Date Noted  . 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 Medication management 09/27/2019  . Venous insufficiency 09/27/2019  . Morbid obesity ([place])   . Type 2 diabetes mellitus with hyperlipidemia ([place])   . Vitamin D deficiency   . GERD (gastroesophageal reflux disease)   . Sleep apnea   . Hypertension     Past Surgical History:  Procedure Laterality Date  . COLONOSCOPY  2010   no family history  . REPLACEMENT TOTAL KNEE Left    BACK IN 2010  . TONSILLECTOMY    . TOTAL SHOULDER REPLACEMENT Right    8.21.2018       History  reviewed. No pertinent family history.  Social History   Tobacco Use  . Smoking status: Never Smoker  . Smokeless tobacco: Never Used  Substance Use Topics  . Alcohol use: Not on file  . Drug use: Never    Home Medications Prior to Admission medications   Medication Sig Start Date End Date Taking? Authorizing Provider  atorvastatin (LIPITOR) 80 MG tablet Take 1 tablet Daily for Cholesterol Patient taking differently: Take 80 mg by mouth daily at 6 PM.  11/13/19  Yes Unk Pinto, MD  carvedilol (COREG) 25 MG tablet Take 25 mg by mouth 2 (two) times daily with a meal.   Yes [provider]  Cholecalciferol 125 MCG (5000 UT) TABS Take 5,000 Units by mouth.   Yes [provider]  clopidogrel (PLAVIX) 75 MG tablet Take 75 mg by mouth. 01/18/18  Yes [provider]  ezetimibe (ZETIA) 10 MG tablet Take 1 tablet (10 mg total) by mouth daily. 09/30/19 09/29/20 Yes Vicie Mutters, PA-C  furosemide (LASIX) 40 MG tablet Take 1 tablet 2 x /day for BP & Fluid Retention / Ankle Swelling Patient taking differently: Take 20 mg by mouth daily.  11/13/19  Yes Unk Pinto, MD  meloxicam (MOBIC) 15 MG tablet Take 15 mg by mouth.   Yes [provider]  metFORMIN (GLUCOPHAGE) 1000 MG tablet Take 1,000 mg by mouth 2 (  two) times daily with a meal.    Yes [provider]  omeprazole (PRILOSEC) 40 MG capsule Take 40 mg by mouth.   Yes [provider]  Semaglutide, 1 MG/DOSE, (OZEMPIC, 1 MG/DOSE,) 2 MG/1.5ML SOPN Inject 1 mg into the skin once a week. Inject into the skin. 09/27/19  Yes Vicie Mutters, PA-C  triamcinolone ointment (KENALOG) 0.1 % Apply 1 application topically 2 (two) times daily. Patient taking differently: Apply 1 application topically daily as needed (rash).  09/27/19  Yes Vicie Mutters, PA-C  valsartan (DIOVAN) 160 MG tablet Take 1 tablet 2 x  /day for BP Patient taking differently: Take 160 mg by mouth 2 (two) times daily.  11/13/19  Yes  Unk Pinto, MD  carvedilol (COREG) 12.5 MG tablet Take 1 tablet (12.5 mg total) by mouth 2 (two) times daily. Patient not taking: Reported on 11/15/2019 11/02/19 11/01/20  Vicie Mutters, PA-C  Coenzyme Q10 200 MG capsule Take 200 mg by mouth daily before breakfast.    [provider]  doxycycline (VIBRAMYCIN) 100 MG capsule Take 1 capsule twice daily with food Patient not taking: Reported on 11/15/2019 09/27/19   Vicie Mutters, PA-C  glyBURIDE (DIABETA) 2.5 MG tablet Take 1 tablet Daily with Breakfast for Diabetes Patient not taking: Reported on 11/15/2019 11/13/19   Unk Pinto, MD  mupirocin cream Drue Stager) 2 % Apply to affected area 3 times daily Patient not taking: Reported on 11/15/2019 09/27/19 09/26/20  Vicie Mutters, PA-C    Allergies    Sulfamethoxazole-trimethoprim  Review of Systems   Review of Systems  Constitutional:       Per HPI, otherwise negative  HENT:       Per HPI, otherwise negative  Respiratory:       Per HPI, otherwise negative  Cardiovascular:       Per HPI, otherwise negative  Gastrointestinal: Negative for vomiting.  Endocrine:       Negative aside from HPI  Genitourinary:       Neg aside from HPI   Musculoskeletal:       Per HPI, otherwise negative  Skin: Negative.   Neurological: Negative for syncope.    Physical Exam Updated Vital Signs BP (!) 164/79   Pulse (!) 59   Temp 98.2 F (36.8 C) (Oral)   Resp 18   Ht 5\' 10"  (1.778 m)   Wt (!) 154.2 kg   SpO2 95%   BMI 48.78 kg/m   Physical Exam Vitals and nursing note reviewed.  Constitutional:      General: He is not in acute distress.    Appearance: He is well-developed. He is obese. He is ill-appearing.     Comments: Uncomfortable appearing obese adult male awake and alert  HENT:     Head: Normocephalic and atraumatic.  Eyes:     Conjunctiva/sclera: Conjunctivae normal.  Cardiovascular:     Rate and Rhythm: Regular rhythm.  Pulmonary:     Effort: Pulmonary effort is  normal. No respiratory distress.     Breath sounds: No stridor.  Abdominal:     General: There is no distension.  Genitourinary:   Skin:    General: Skin is warm and dry.  Neurological:     Mental Status: He is alert and oriented to person, place, and time.     ED Results / Procedures / Treatments   Labs (all labs ordered are listed, but only abnormal results are displayed) Labs Reviewed  CBC WITH DIFFERENTIAL/PLATELET - Abnormal; Notable for the following components:  Result Value   WBC 16.1 (*)    Hemoglobin 11.9 (*)    HCT 38.2 (*)    MCV 77.5 (*)    MCH 24.1 (*)    RDW 16.1 (*)    Neutro Abs 12.8 (*)    Monocytes Absolute 1.5 (*)    All other components within normal limits  COMPREHENSIVE METABOLIC PANEL - Abnormal; Notable for the following components:   Glucose, Bld 114 (*)    Calcium 8.8 (*)    All other components within normal limits  POC OCCULT BLOOD, ED - Abnormal; Notable for the following components:   Fecal Occult Bld POSITIVE (*)    All other components within normal limits  SARS CORONAVIRUS 2 (TAT 6-24 HRS)  URINALYSIS, ROUTINE W REFLEX MICROSCOPIC  LACTIC ACID, PLASMA  LIPASE, BLOOD    EKG None  Radiology CT ABDOMEN PELVIS W CONTRAST  Result Date: 11/15/2019 CLINICAL DATA:  Rectal pain with blood and purulence near rectum. EXAM: CT ABDOMEN AND PELVIS WITH CONTRAST TECHNIQUE: Multidetector CT imaging of the abdomen and pelvis was performed using the standard protocol following bolus administration of intravenous contrast. CONTRAST:  14mL OMNIPAQUE IOHEXOL 300 MG/ML  SOLN COMPARISON:  None. FINDINGS: Lower chest: Subsegmental atelectasis or scarring in both lower lobes. No pleural fluid. Calcified lymph node in the inferior left hilum. Calcified granuloma in the left lower lobe. Upper normal heart size. No pericardial effusion. Hepatobiliary: Enlarged liver spanning 24 cm cranial caudal. Diffuse hepatic steatosis. Few scattered calcified granuloma.  No evidence of focal hepatic lesion. Gallbladder physiologically distended, no calcified stone. No biliary dilatation. Pancreas: No ductal dilatation or inflammation. Spleen: Normal in size. Scattered calcified granuloma. Adrenals/Urinary Tract: Normal adrenal glands. No hydronephrosis or perinephric edema. Homogeneous renal enhancement with symmetric excretion on delayed phase imaging. 2.2 cm low-density lesion in the lower right kidney is likely cyst, evaluation partially obscured by streak artifact related to habitus abutting the CT gantry. Urinary bladder is near completely empty. Stomach/Bowel: Inflammatory changes about the inferior right gluteal crease with small focus of air, series 3, image 5, no drainable fluid collection. There is no definite connection to the anus or rectum. No rectal wall thickening. Small volume of colonic stool with scattered diverticulosis. No diverticulitis. No small bowel obstruction or inflammation. Diminutive appendix tentatively visualized. No evidence of appendicitis. Vascular/Lymphatic: Mild aortic atherosclerosis. No aortic aneurysm. No evidence of portal vein thrombosis. No enlarged lymph nodes in the abdomen or pelvis. Reproductive: Prostate is unremarkable. Other: Inflammatory changes about the inferior right gluteal crease is small focus of air, best appreciated on series 3, image 5. No focal fluid collection. No intra-abdominopelvic fluid collection. No free air free fluid. Tiny fat containing umbilical hernia. Fat in both inguinal canals. Musculoskeletal: Multilevel degenerative change in the spine with degenerative disc disease and facet hypertrophy. There are no acute or suspicious osseous abnormalities. IMPRESSION: 1. Inflammatory changes about the inferior right gluteal crease with small focus of air, no drainable fluid collection. No definite connection to the anus or rectum. 2. No other acute findings in the abdomen/pelvis. Incidental findings of hepatomegaly and  hepatic steatosis. Colonic diverticulosis without diverticulitis. Aortic Atherosclerosis (ICD10-I70.0). Electronically Signed   By: Keith Rake M.D.   On: 11/15/2019 15:36    Procedures Procedures (including critical care time)  Medications Ordered in ED Medications  sodium chloride 0.9 % bolus 1,000 mL (0 mLs Intravenous Stopped 11/15/19 1640)    And  0.9 %  sodium chloride infusion ( Intravenous New Bag/Given 11/15/19 1440)  sodium chloride (PF) 0.9 % injection (0 mLs  Hold 11/15/19 1607)  fentaNYL (SUBLIMAZE) injection 25 mcg (25 mcg Intravenous Given 11/15/19 1227)  piperacillin-tazobactam (ZOSYN) IVPB 3.375 g (0 g Intravenous Stopped 11/15/19 1309)  iohexol (OMNIPAQUE) 300 MG/ML solution 100 mL (100 mLs Intravenous Contrast Given 11/15/19 1511)    ED Course  I have reviewed the triage vital signs and the nursing notes.  Pertinent labs & imaging results that were available during my care of the patient were reviewed by me and considered in my medical decision making (see chart for details).  With concern for blood and pus about the patient's rectum, consideration of infection versus abscess, labs, CT ordered.  Fluids provided, antibiotics starting as well.    MDM Rules/Calculators/A&P                      4:50 PM Have discussed the patient's case with our surgical colleagues who have seen and evaluate the patient as well.  He continues to have active drainage of purulent material.  We discussed the patient's CT scan, and have also discussed with him as well.  Given concern for perirectal abscess, leukocytosis, risk profile including hypertension and diabetes, the patient will continue IV antibiotics, fluids, be admitted for further monitoring, management. Final Clinical Impression(s) / ED Diagnoses Final diagnoses:  Perirectal abscess     Carmin Muskrat, MD 11/15/19 1651

## 2019-11-15 NOTE — ED Notes (Signed)
Attempted to call report to the floor. RN was busy at this time and asked to call back in 10 min

## 2019-11-15 NOTE — ED Notes (Signed)
Admitting MD at bedside.

## 2019-11-15 NOTE — ED Notes (Signed)
Called phlebotomy again for lt green blood collection, first phlebotomy attempt was not successful.

## 2019-11-15 NOTE — Consult Note (Signed)
Dustin Ayala [birth year]  [id number].    Requesting MD: Dr. Carmin Muskrat Chief Complaint/Reason for Consult: Right gluteal crease abscess  HPI:  This is a 64 year old morbidly obese white male with a history of diabetes and hypertension who states 2 to 3 days ago he began having some right gluteal crease pain.  He has had increasing malaise and fatigue.  He denies any fevers or chills.  He had his wife look at his backside which she did not see any abnormalities.  His pain has continued to persist.  He felt like he had a bump or a pimple that was causing his pain.  He denies ever having a prior abscess in this area before.  He presented to the emergency department for further evaluation.  Upon arrival to the emergency department this abscess spontaneously began to drain and has been draining copious bloody purulent drainage.  He underwent a CT scan that revealed no drainable fluid collection but some inflammatory changes were noted at the right gluteal crease.  His white blood cell count is around 16,000.  He is going to be admitted for medical management with antibiotic therapy.  We have been asked to evaluate him to make sure refill at this area is adequately drained and he does not need any further surgical intervention.  ROS: ROS: Please see HPI, otherwise all other systems have been reviewed and are negative.  History reviewed. No pertinent family history.  Past Medical History:  Diagnosis Date  . GERD (gastroesophageal reflux disease)   . Hypertension   . Morbid obesity ([place])   . Sleep apnea   . Type 2 diabetes mellitus with hyperlipidemia ([place])   . Vitamin D deficiency     Past Surgical History:  Procedure Laterality Date  . COLONOSCOPY  2010   no family history  . REPLACEMENT TOTAL KNEE Left    BACK IN 2010  . TONSILLECTOMY    . TOTAL SHOULDER REPLACEMENT Right    8.21.2018    Social History:  reports that he has never smoked. He has never used smokeless tobacco.  He reports that he does not use drugs. No history on file for alcohol.  Allergies:  Allergies  Allergen Reactions  . Sulfamethoxazole-Trimethoprim Rash    (Not in a hospital admission)    Physical Exam: Blood pressure (!) 152/69, pulse 60, temperature 98.2 F (36.8 C), temperature source Oral, resp. rate 18, height 5\' 10"  (1.778 m), weight (!) 154.2 kg, SpO2 97 %. General: pleasant, morbidly obese white male who is laying in bed in NAD HEENT: head is normocephalic, atraumatic.  Sclera are noninjected.  PERRL.  Ears and nose without any masses or lesions.  Mouth is pink and moist Heart: regular, rate, and rhythm.  Normal s1,s2. No obvious murmurs, gallops, or rubs noted.  Palpable radial and pedal pulses bilaterally Lungs: CTAB, no wheezes, rhonchi, or rales noted.  Respiratory effort nonlabored Skin: warm and dry with no masses, lesions, or rashes.  He does have an area essentially where the perineum and the gluteal crease meet that does appear erythematous and indurated.  He has bloody purulent drainage noted on his pad and on his skin from spontaneous drainage. Neuro: Cranial nerves 2-12 grossly intact, sensation is normal throughout Psych: A&Ox3 with an appropriate affect.    Results for orders placed or performed during the hospital encounter of 11/15/19 (from the past 48 hour(s))  CBC WITH DIFFERENTIAL     Status: Abnormal   Collection Time: 11/15/19 11:55  AM  Result Value Ref Range   WBC 16.1 (H) 4.0 - 10.5 K/uL   RBC 4.93 4.22 - 5.81 MIL/uL   Hemoglobin 11.9 (L) 13.0 - 17.0 g/dL   HCT 38.2 (L) 39.0 - 52.0 %   MCV 77.5 (L) 80.0 - 100.0 fL   MCH 24.1 (L) 26.0 - 34.0 pg   MCHC 31.2 30.0 - 36.0 g/dL   RDW 16.1 (H) 11.5 - 15.5 %   Platelets 311 150 - 400 K/uL   nRBC 0.0 0.0 - 0.2 %   Neutrophils Relative % 81 %   Neutro Abs 12.8 (H) 1.7 - 7.7 K/uL   Lymphocytes Relative 9 %   Lymphs Abs 1.5 0.7 - 4.0 K/uL   Monocytes Relative 9 %   Monocytes Absolute 1.5 (H) 0.1 - 1.0  K/uL   Eosinophils Relative 1 %   Eosinophils Absolute 0.1 0.0 - 0.5 K/uL   Basophils Relative 0 %   Basophils Absolute 0.1 0.0 - 0.1 K/uL   Immature Granulocytes 0 %   Abs Immature Granulocytes 0.07 0.00 - 0.07 K/uL    Comment: Performed at [facility], [place] [address], [place], [place] [address]  Urinalysis, Routine w reflex microscopic     Status: None   Collection Time: 11/15/19 11:55 AM  Result Value Ref Range   Color, Urine YELLOW YELLOW   APPearance CLEAR CLEAR   Specific Gravity, Urine 1.020 1.005 - 1.030   pH 6.0 5.0 - 8.0   Glucose, UA NEGATIVE NEGATIVE mg/dL   Hgb urine dipstick NEGATIVE NEGATIVE   Bilirubin Urine NEGATIVE NEGATIVE   Ketones, ur NEGATIVE NEGATIVE mg/dL   Protein, ur NEGATIVE NEGATIVE mg/dL   Nitrite NEGATIVE NEGATIVE   Leukocytes,Ua NEGATIVE NEGATIVE    Comment: Microscopic not done on urines with negative protein, blood, leukocytes, nitrite, or glucose < 500 mg/dL. Performed at [facility], [place] [address] [place], [place] [address]   Lactic acid, plasma     Status: None   Collection Time: 11/15/19 11:55 AM  Result Value Ref Range   Lactic Acid, Venous 1.4 0.5 - 1.9 mmol/L    Comment: Performed at [facility], [place] [address] [place],  [address]  POC occult blood, ED Provider will collect     Status: Abnormal   Collection Time: 11/15/19 12:15 PM  Result Value Ref Range   Fecal Occult Bld POSITIVE (A) NEGATIVE  Comprehensive metabolic panel     Status: Abnormal   Collection Time: 11/15/19  2:09 PM  Result Value Ref Range   Sodium 137 135 - 145 mmol/L   Potassium 4.0 3.5 - 5.1 mmol/L   Chloride 103 98 - 111 mmol/L   CO2 23 22 - 32 mmol/L   Glucose, Bld 114 (H) 70 - 99 mg/dL    Comment: Glucose reference range applies only to samples taken after fasting for at least 8 hours.   BUN 23 8 - 23 mg/dL   Creatinine, Ser 1.05 0.61 - 1.24 mg/dL   Calcium 8.8 (L) 8.9 - 10.3 mg/dL   Total  Protein 7.4 6.5 - 8.1 g/dL   Albumin 3.6 3.5 - 5.0 g/dL   AST 15 15 - 41 U/L   ALT 22 0 - 44 U/L   Alkaline Phosphatase 84 38 - 126 U/L   Total Bilirubin 0.9 0.3 - 1.2 mg/dL   GFR calc non Af Amer >60 >60 mL/min   GFR calc Af Amer >60 >60 mL/min   Anion  gap 11 5 - 15    Comment: Performed at [facility], [place] [address], [place], [place] [address]  Lipase, blood     Status: None   Collection Time: 11/15/19  2:09 PM  Result Value Ref Range   Lipase 27 11 - 51 U/L    Comment: Performed at [facility], [place] [address] [place], [place] [address]   CT ABDOMEN PELVIS W CONTRAST  Result Date: 11/15/2019 CLINICAL DATA:  Rectal pain with blood and purulence near rectum. EXAM: CT ABDOMEN AND PELVIS WITH CONTRAST TECHNIQUE: Multidetector CT imaging of the abdomen and pelvis was performed using the standard protocol following bolus administration of intravenous contrast. CONTRAST:  161mL OMNIPAQUE IOHEXOL 300 MG/ML  SOLN COMPARISON:  None. FINDINGS: Lower chest: Subsegmental atelectasis or scarring in both lower lobes. No pleural fluid. Calcified lymph node in the inferior left hilum. Calcified granuloma in the left lower lobe. Upper normal heart size. No pericardial effusion. Hepatobiliary: Enlarged liver spanning 24 cm cranial caudal. Diffuse hepatic steatosis. Few scattered calcified granuloma. No evidence of focal hepatic lesion. Gallbladder physiologically distended, no calcified stone. No biliary dilatation. Pancreas: No ductal dilatation or inflammation. Spleen: Normal in size. Scattered calcified granuloma. Adrenals/Urinary Tract: Normal adrenal glands. No hydronephrosis or perinephric edema. Homogeneous renal enhancement with symmetric excretion on delayed phase imaging. 2.2 cm low-density lesion in the lower right kidney is likely cyst, evaluation partially obscured by streak artifact related to habitus abutting the CT gantry. Urinary bladder is near completely  empty. Stomach/Bowel: Inflammatory changes about the inferior right gluteal crease with small focus of air, series 3, image 5, no drainable fluid collection. There is no definite connection to the anus or rectum. No rectal wall thickening. Small volume of colonic stool with scattered diverticulosis. No diverticulitis. No small bowel obstruction or inflammation. Diminutive appendix tentatively visualized. No evidence of appendicitis. Vascular/Lymphatic: Mild aortic atherosclerosis. No aortic aneurysm. No evidence of portal vein thrombosis. No enlarged lymph nodes in the abdomen or pelvis. Reproductive: Prostate is unremarkable. Other: Inflammatory changes about the inferior right gluteal crease is small focus of air, best appreciated on series 3, image 5. No focal fluid collection. No intra-abdominopelvic fluid collection. No free air free fluid. Tiny fat containing umbilical hernia. Fat in both inguinal canals. Musculoskeletal: Multilevel degenerative change in the spine with degenerative disc disease and facet hypertrophy. There are no acute or suspicious osseous abnormalities. IMPRESSION: 1. Inflammatory changes about the inferior right gluteal crease with small focus of air, no drainable fluid collection. No definite connection to the anus or rectum. 2. No other acute findings in the abdomen/pelvis. Incidental findings of hepatomegaly and hepatic steatosis. Colonic diverticulosis without diverticulitis. Aortic Atherosclerosis (ICD10-I70.0). Electronically Signed   By: Keith Rake M.D.   On: 11/15/2019 15:36      Assessment/Plan DM HTN OSA Obesity  Right gluteal crease abscess This area appears to have spontaneously drained.  We agree with medical management for IV antibiotics for elevation of his white blood cell count.  We will continue to follow to make sure this area does not still back up and require further surgical I&D.  As of right now as long as it continues to spontaneously drain, no  surgical intervention is warranted.  We will continue to follow this patient with you.   FEN -may have a diet from our standpoint VTE -may have chemical prophylaxis from our standpoint ID -Per medical service   Henreitta Cea, [facility] Surgery 11/15/2019, 4:36 PM  Please see Amion for pager number during day hours 7:00am-4:30pm or 7:00am -11:30am on weekends

## 2019-11-15 NOTE — ED Notes (Signed)
Pt in CT.

## 2019-11-15 NOTE — ED Triage Notes (Signed)
Pt presents with c/o rectal pain for the past couple of days. Pt reports his wife did not note anything on the outside of his rectum but reports that the inside is very painful. Pt reports pain with sitting, denies any bleeding.

## 2019-11-15 NOTE — ED Notes (Signed)
Dr. Harlow Asa at bedside

## 2019-11-16 DIAGNOSIS — E0822 Diabetes mellitus due to underlying condition with diabetic chronic kidney disease: Secondary | ICD-10-CM | POA: Diagnosis not present

## 2019-11-16 DIAGNOSIS — N182 Chronic kidney disease, stage 2 (mild): Secondary | ICD-10-CM | POA: Diagnosis not present

## 2019-11-16 DIAGNOSIS — R195 Other fecal abnormalities: Secondary | ICD-10-CM

## 2019-11-16 DIAGNOSIS — L0231 Cutaneous abscess of buttock: Secondary | ICD-10-CM | POA: Diagnosis not present

## 2019-11-16 DIAGNOSIS — Z794 Long term (current) use of insulin: Secondary | ICD-10-CM

## 2019-11-16 LAB — COMPREHENSIVE METABOLIC PANEL
ALT: 19 U/L (ref 0–44)
AST: 15 U/L (ref 15–41)
Albumin: 3.2 g/dL — ABNORMAL LOW (ref 3.5–5.0)
Alkaline Phosphatase: 72 U/L (ref 38–126)
Anion gap: 8 (ref 5–15)
BUN: 19 mg/dL (ref 8–23)
CO2: 23 mmol/L (ref 22–32)
Calcium: 8.7 mg/dL — ABNORMAL LOW (ref 8.9–10.3)
Chloride: 105 mmol/L (ref 98–111)
Creatinine, Ser: 1.06 mg/dL (ref 0.61–1.24)
GFR calc Af Amer: >60 mL/min (ref >60)
GFR calc non Af Amer: >60 mL/min (ref >60)
Glucose, Bld: 132 mg/dL — ABNORMAL HIGH (ref 70–99)
Potassium: 4 mmol/L (ref 3.5–5.1)
Sodium: 136 mmol/L (ref 135–145)
Total Bilirubin: 0.8 mg/dL (ref 0.3–1.2)
Total Protein: 6.7 g/dL (ref 6.5–8.1)

## 2019-11-16 LAB — CBC
HCT: 34.9 % — ABNORMAL LOW (ref 39.0–52.0)
Hemoglobin: 10.6 g/dL — ABNORMAL LOW (ref 13.0–17.0)
MCH: 24 pg — ABNORMAL LOW (ref 26.0–34.0)
MCHC: 30.4 g/dL (ref 30.0–36.0)
MCV: 79.1 fL — ABNORMAL LOW (ref 80.0–100.0)
Platelets: 282 10*3/uL (ref 150–400)
RBC: 4.41 MIL/uL (ref 4.22–5.81)
RDW: 15.9 % — ABNORMAL HIGH (ref 11.5–15.5)
WBC: 10.2 10*3/uL (ref 4.0–10.5)
nRBC: 0 % (ref 0.0–0.2)

## 2019-11-16 LAB — GLUCOSE, CAPILLARY
Glucose-Capillary: 113 mg/dL — ABNORMAL HIGH (ref 70–99)
Glucose-Capillary: 102 mg/dL — ABNORMAL HIGH (ref 70–99)

## 2019-11-16 LAB — HIV ANTIBODY (ROUTINE TESTING W REFLEX): HIV Screen 4th Generation wRfx: NONREACTIVE

## 2019-11-16 MED ORDER — TRAMADOL HCL 50 MG PO TABS: 50.0000 mg | ORAL_TABLET | Freq: Two times a day (BID) | ORAL | 0 refills | Status: AC | PRN

## 2019-11-16 MED ORDER — METRONIDAZOLE 500 MG PO TABS: 500.0000 mg | ORAL_TABLET | Freq: Three times a day (TID) | ORAL | 0 refills | Status: AC

## 2019-11-16 MED ORDER — LEVOFLOXACIN 500 MG PO TABS: 500.0000 mg | ORAL_TABLET | Freq: Every day | ORAL | 0 refills | Status: AC

## 2019-11-16 MED ORDER — DOXYCYCLINE HYCLATE 100 MG PO TABS: 100.0000 mg | ORAL_TABLET | Freq: Two times a day (BID) | ORAL | 0 refills | Status: AC

## 2019-11-16 NOTE — Discharge Summary (Signed)
Physician Discharge Summary  Jarquis Reichl [id number] DOB: Oct 27, 1955 DOA: 11/15/2019  PCP: Unk Pinto, MD  Admit date: 11/15/2019 Discharge date: 11/16/2019  Admitted From: Home Disposition: Home  Recommendations for Outpatient Follow-up:  1. Follow up with PCP in 1 week 2. Repeat CBC 3. Please follow up on the following pending results: None  Home Health: None Equipment/Devices: None  Discharge Condition: Stable CODE STATUS: Full code Diet recommendation: Heart healthy/carb modified   Brief/Interim Summary:  Admission HPI written by Desiree Hane, MD   Chief Complaint: Right buttocks pain  HPI: Dustin Ayala is a 64 y.o. male with medical history significant for severe obesity, type 2 diabetes, HTN, HLD, CKD stage II who presents on 11/15/2019 with 3 days of progressively worsening right buttocks pain.    Started noticing pain and irritation on the right buttocks that he thought was an ingrown hair about 3 days prior to admission. He had his wife take a look but she didn't could not see any lesions. For the last 2 days the pain had severely worsened to the point of from feeling worsening weakness which prompted him to come to the ED.  While in the ED sitting in the waiting room it spontaneously burst and he immediately felt much better.  Denies any hematochezia or melena, fevers or chills.   He takes Lasix every morning for swelling in both legs  Takes metformin, glyburide, and metformin for his diabetes He also takes carvedilol and irbesartan to control his blood pressure He is also adherent to Zetia and atorvastatin for high cholesterol   Hospital course:  Right gluteal abscess Spontaneously drained prior to admission. Patient with an initial leukocytosis. Afebrile. Patient started on doxycycline, Levaquin and Flagyl. Continue on discharge and treat for 5 days. General surgery evaluated and recommend no surgery. Leukocytosis significantly improved from  16,100 to 10,200. Hospital follow-up with PCP recommended.  Positive fecal occult blood test From DRE rather than stool. In setting of hemorrhoids and draining abscess. Recommend outpatient follow-up.  Microcytic anemia No GI bleeding in history. Does have a history of hemorrhoids. Will need outpatient follow-up.  Obesity Body mass index is 48.78 kg/m.  Diabetes mellitus, type 2 Continue outpatient regimen  CKD stage II Stable.  Essential hypertension Continue outpatient regimen  Hyperlipidemia Continue outpatient regimen   Discharge Diagnoses:  Active Problems:   Morbid obesity (HCC)   Type 2 diabetes mellitus with hyperlipidemia (HCC)   GERD (gastroesophageal reflux disease)   Hypertension   Diabetes mellitus due to underlying condition with stage 2 chronic kidney disease, with long-term current use of insulin (HCC)   Venous insufficiency   Abscess, gluteal, right   Leukocytosis   Fecal occult blood test positive   Microcytic anemia    Discharge Instructions  Discharge Instructions    Call MD for:  severe uncontrolled pain   Complete by: As directed    Call MD for:  temperature >100.4   Complete by: As directed    Diet - low sodium heart healthy   Complete by: As directed    Increase activity slowly   Complete by: As directed      Allergies as of 11/16/2019      Reactions   Sulfamethoxazole-trimethoprim Rash      Medication List    STOP taking these medications   doxycycline 100 MG capsule Commonly known as: VIBRAMYCIN Replaced by: doxycycline 100 MG tablet   glyBURIDE 2.5 MG tablet Commonly known as: DIABETA   mupirocin cream 2 %  Commonly known as: Bactroban     TAKE these medications   atorvastatin 80 MG tablet Commonly known as: LIPITOR Take 1 tablet Daily for Cholesterol What changed:   how much to take  how to take this  when to take this  additional instructions   carvedilol 25 MG tablet Commonly known as: COREG Take 25 mg  by mouth 2 (two) times daily with a meal. What changed: Another medication with the same name was removed. Continue taking this medication, and follow the directions you see here.   Cholecalciferol 125 MCG (5000 UT) Tabs Take 5,000 Units by mouth.   clopidogrel 75 MG tablet Commonly known as: PLAVIX Take 75 mg by mouth.   Coenzyme Q10 200 MG capsule Take 200 mg by mouth daily before breakfast.   doxycycline 100 MG tablet Commonly known as: VIBRA-TABS Take 1 tablet (100 mg total) by mouth every 12 (twelve) hours for 4 days. Replaces: doxycycline 100 MG capsule   ezetimibe 10 MG tablet Commonly known as: Zetia Take 1 tablet (10 mg total) by mouth daily.   furosemide 40 MG tablet Commonly known as: LASIX Take 1 tablet 2 x /day for BP & Fluid Retention / Ankle Swelling What changed:   how much to take  how to take this  when to take this  additional instructions   levofloxacin 500 MG tablet Commonly known as: LEVAQUIN Take 1 tablet (500 mg total) by mouth at bedtime for 4 days.   meloxicam 15 MG tablet Commonly known as: MOBIC Take 15 mg by mouth.   metFORMIN 1000 MG tablet Commonly known as: GLUCOPHAGE Take 1,000 mg by mouth 2 (two) times daily with a meal.   metroNIDAZOLE 500 MG tablet Commonly known as: FLAGYL Take 1 tablet (500 mg total) by mouth every 8 (eight) hours for 4 days.   omeprazole 40 MG capsule Commonly known as: PRILOSEC Take 40 mg by mouth.   Ozempic (1 MG/DOSE) 2 MG/1.5ML Sopn Generic drug: Semaglutide (1 MG/DOSE) Inject 1 mg into the skin once a week. Inject into the skin.   traMADol 50 MG tablet Commonly known as: ULTRAM Take 1 tablet (50 mg total) by mouth every 12 (twelve) hours as needed for up to 5 days for moderate pain.   triamcinolone ointment 0.1 % Commonly known as: KENALOG Apply 1 application topically 2 (two) times daily. What changed:   when to take this  reasons to take this   valsartan 160 MG tablet Commonly known  as: DIOVAN Take 1 tablet 2 x  /day for BP What changed:   how much to take  how to take this  when to take this  additional instructions      Follow-up Information    Unk Pinto, MD. Schedule an appointment as soon as possible for a visit in 1 week(s).   Specialty: Internal Medicine Why: Hospital follow-up Contact information: [address] [place] [address] [phone]          Allergies  Allergen Reactions  . Sulfamethoxazole-Trimethoprim Rash    Consultations:  General surgery   Procedures/Studies: CT ABDOMEN PELVIS W CONTRAST  Result Date: 11/15/2019 CLINICAL DATA:  Rectal pain with blood and purulence near rectum. EXAM: CT ABDOMEN AND PELVIS WITH CONTRAST TECHNIQUE: Multidetector CT imaging of the abdomen and pelvis was performed using the standard protocol following bolus administration of intravenous contrast. CONTRAST:  197mL OMNIPAQUE IOHEXOL 300 MG/ML  SOLN COMPARISON:  None. FINDINGS: Lower chest: Subsegmental atelectasis or scarring in both  lower lobes. No pleural fluid. Calcified lymph node in the inferior left hilum. Calcified granuloma in the left lower lobe. Upper normal heart size. No pericardial effusion. Hepatobiliary: Enlarged liver spanning 24 cm cranial caudal. Diffuse hepatic steatosis. Few scattered calcified granuloma. No evidence of focal hepatic lesion. Gallbladder physiologically distended, no calcified stone. No biliary dilatation. Pancreas: No ductal dilatation or inflammation. Spleen: Normal in size. Scattered calcified granuloma. Adrenals/Urinary Tract: Normal adrenal glands. No hydronephrosis or perinephric edema. Homogeneous renal enhancement with symmetric excretion on delayed phase imaging. 2.2 cm low-density lesion in the lower right kidney is likely cyst, evaluation partially obscured by streak artifact related to habitus abutting the CT gantry. Urinary bladder is near completely empty. Stomach/Bowel:  Inflammatory changes about the inferior right gluteal crease with small focus of air, series 3, image 5, no drainable fluid collection. There is no definite connection to the anus or rectum. No rectal wall thickening. Small volume of colonic stool with scattered diverticulosis. No diverticulitis. No small bowel obstruction or inflammation. Diminutive appendix tentatively visualized. No evidence of appendicitis. Vascular/Lymphatic: Mild aortic atherosclerosis. No aortic aneurysm. No evidence of portal vein thrombosis. No enlarged lymph nodes in the abdomen or pelvis. Reproductive: Prostate is unremarkable. Other: Inflammatory changes about the inferior right gluteal crease is small focus of air, best appreciated on series 3, image 5. No focal fluid collection. No intra-abdominopelvic fluid collection. No free air free fluid. Tiny fat containing umbilical hernia. Fat in both inguinal canals. Musculoskeletal: Multilevel degenerative change in the spine with degenerative disc disease and facet hypertrophy. There are no acute or suspicious osseous abnormalities. IMPRESSION: 1. Inflammatory changes about the inferior right gluteal crease with small focus of air, no drainable fluid collection. No definite connection to the anus or rectum. 2. No other acute findings in the abdomen/pelvis. Incidental findings of hepatomegaly and hepatic steatosis. Colonic diverticulosis without diverticulitis. Aortic Atherosclerosis (ICD10-I70.0). Electronically Signed   By: Keith Rake M.D.   On: 11/15/2019 15:36       Subjective: Drainage overnight. Pain is much improved. Able to sit down.  Discharge Exam: Vitals:   11/15/19 2045 11/16/19 0536  BP: 130/60 134/72  Pulse: 63 (!) 57  Resp: 16 17  Temp: 99 F (37.2 C) 97.8 F (36.6 C)  SpO2: 93% 96%   Vitals:   11/15/19 1730 11/15/19 1825 11/15/19 2045 11/16/19 0536  BP: (!) 161/70 (!) 156/74 130/60 134/72  Pulse: 60 61 63 (!) 57  Resp: 20 17 16 17   Temp:  98.5 F  (36.9 C) 99 F (37.2 C) 97.8 F (36.6 C)  TempSrc:  Oral Oral Oral  SpO2: 95% 96% 93% 96%  Weight:      Height:        General: Pt is alert, awake, not in acute distress Cardiovascular: RRR, S1/S2 +, no rubs, no gallops Respiratory: CTA bilaterally, no wheezing, no rhonchi Abdominal: Soft, NT, ND, bowel sounds + Extremities: no edema, no cyanosis    The results of significant diagnostics from this hospitalization (including imaging, microbiology, ancillary and laboratory) are listed below for reference.     Microbiology: Recent Results (from the past 240 hour(s))  SARS CORONAVIRUS 2 (TAT 6-24 HRS) Nasopharyngeal Nasopharyngeal Swab     Status: None   Collection Time: 11/15/19  4:08 PM   Specimen: Nasopharyngeal Swab  Result Value Ref Range Status   SARS Coronavirus 2 NEGATIVE NEGATIVE Final    Comment: (NOTE) SARS-CoV-2 target nucleic acids are NOT DETECTED. The SARS-CoV-2 RNA is generally detectable in  upper and lower respiratory specimens during the acute phase of infection. Negative results do not preclude SARS-CoV-2 infection, do not rule out co-infections with other pathogens, and should not be used as the sole basis for treatment or other patient management decisions. Negative results must be combined with clinical observations, patient history, and epidemiological information. The expected result is Negative. Fact Sheet for Patients: SugarRoll.be Fact Sheet for Healthcare Providers: https://www.woods-mathews.com/ This test is not yet approved or cleared by the [place] FDA and  has been authorized for detection and/or diagnosis of SARS-CoV-2 by FDA under an Emergency Use Authorization (EUA). This EUA will remain  in effect (meaning this test can be used) for the duration of the COVID-19 declaration under Section 56 4(b)(1) of the Act, 21 U.S.C. section 360bbb-3(b)(1), unless the authorization is terminated or revoked  sooner. Performed at [facility], [place] [address], [place], [place] [address]      Labs: BNP (last 3 results) No results for input(s): BNP in the last 8760 hours. Basic Metabolic Panel: Recent Labs  Lab 11/15/19 1409 11/16/19 0528  NA 137 136  K 4.0 4.0  CL 103 105  CO2 23 23  GLUCOSE 114* 132*  BUN 23 19  CREATININE 1.05 1.06  CALCIUM 8.8* 8.7*   Liver Function Tests: Recent Labs  Lab 11/15/19 1409 11/16/19 0528  AST 15 15  ALT 22 19  ALKPHOS 84 72  BILITOT 0.9 0.8  PROT 7.4 6.7  ALBUMIN 3.6 3.2*   Recent Labs  Lab 11/15/19 1409  LIPASE 27   No results for input(s): AMMONIA in the last 168 hours. CBC: Recent Labs  Lab 11/15/19 1155 11/16/19 0528  WBC 16.1* 10.2  NEUTROABS 12.8*  --   HGB 11.9* 10.6*  HCT 38.2* 34.9*  MCV 77.5* 79.1*  PLT 311 282   Cardiac Enzymes: No results for input(s): CKTOTAL, CKMB, CKMBINDEX, TROPONINI in the last 168 hours. BNP: Invalid input(s): POCBNP CBG: Recent Labs  Lab 11/15/19 2120 11/16/19 0723 11/16/19 1135  GLUCAP 127* 113* 102*   D-Dimer No results for input(s): DDIMER in the last 72 hours. Hgb A1c No results for input(s): HGBA1C in the last 72 hours. Lipid Profile No results for input(s): CHOL, HDL, LDLCALC, TRIG, CHOLHDL, LDLDIRECT in the last 72 hours. Thyroid function studies No results for input(s): TSH, T4TOTAL, T3FREE, THYROIDAB in the last 72 hours.  Invalid input(s): FREET3 Anemia work up Recent Labs    11/15/19 1409  FERRITIN 69  TIBC 388  IRON 27*   Urinalysis    Component Value Date/Time   COLORURINE YELLOW 11/15/2019 1155   APPEARANCEUR CLEAR 11/15/2019 1155   LABSPEC 1.020 11/15/2019 1155   PHURINE 6.0 11/15/2019 1155   GLUCOSEU NEGATIVE 11/15/2019 1155   [place] 11/15/2019 1155   [place] 11/15/2019 1155   [place] 11/15/2019 1155   PROTEINUR NEGATIVE 11/15/2019 1155   NITRITE NEGATIVE 11/15/2019 1155   LEUKOCYTESUR NEGATIVE 11/15/2019  1155   Sepsis Labs Invalid input(s): PROCALCITONIN,  WBC,  LACTICIDVEN Microbiology Recent Results (from the past 240 hour(s))  SARS CORONAVIRUS 2 (TAT 6-24 HRS) Nasopharyngeal Nasopharyngeal Swab     Status: None   Collection Time: 11/15/19  4:08 PM   Specimen: Nasopharyngeal Swab  Result Value Ref Range Status   SARS Coronavirus 2 NEGATIVE NEGATIVE Final    Comment: (NOTE) SARS-CoV-2 target nucleic acids are NOT DETECTED. The SARS-CoV-2 RNA is generally detectable in upper and lower respiratory specimens during the acute phase of infection.  Negative results do not preclude SARS-CoV-2 infection, do not rule out co-infections with other pathogens, and should not be used as the sole basis for treatment or other patient management decisions. Negative results must be combined with clinical observations, patient history, and epidemiological information. The expected result is Negative. Fact Sheet for Patients: SugarRoll.be Fact Sheet for Healthcare Providers: https://www.woods-mathews.com/ This test is not yet approved or cleared by the [place] FDA and  has been authorized for detection and/or diagnosis of SARS-CoV-2 by FDA under an Emergency Use Authorization (EUA). This EUA will remain  in effect (meaning this test can be used) for the duration of the COVID-19 declaration under Section 56 4(b)(1) of the Act, 21 U.S.C. section 360bbb-3(b)(1), unless the authorization is terminated or revoked sooner. Performed at [facility], [place] [address] [place], [place] [address]    SIGNED:   Cordelia Poche, MD [facility] 11/16/2019, 11:59 AM

## 2019-11-16 NOTE — Progress Notes (Signed)
CC:  Subjective: Less of the brown-colored drainage, but some increased bloody drainage from the site.  Currently there is some purulent drainage that will see any blood.  He is draining from the right side on his own.  He cannot really reach back there, he is also not able to sit in the tub because he cannot get up and down.  He does have a bidet on his toilet, and he has a shower with a hand held shower head.  Objective: Vital signs in last 24 hours: Temp:  [97.8 F (36.6 C)-99 F (37.2 C)] 97.8 F (36.6 C) (03/05 0536) Pulse Rate:  [57-63] 57 (03/05 0536) Resp:  [15-20] 17 (03/05 0536) BP: (127-164)/(60-79) 134/72 (03/05 0536) SpO2:  [93 %-98 %] 96 % (03/05 0536) Weight:  [154.2 kg] 154.2 kg (03/04 1050)  1050 IV Urine x 2 Nothing more in I/O Afebrile, VSS WBC improved  Intake/Output from previous day: 03/04 0701 - 03/05 0700 In: 1050 [IV Piggyback:1050] Out: -  Intake/Output this shift: No intake/output data recorded.  General appearance: alert, cooperative and no distress Skin: Strain from the right side.  There is still some purulent drainage coming from the site.  Minimal cellulitis.  Lab Results:  Recent Labs    11/15/19 1155 11/16/19 0528  WBC 16.1* 10.2  HGB 11.9* 10.6*  HCT 38.2* 34.9*  PLT 311 282    BMET Recent Labs    11/15/19 1409 11/16/19 0528  NA 137 136  K 4.0 4.0  CL 103 105  CO2 23 23  GLUCOSE 114* 132*  BUN 23 19  CREATININE 1.05 1.06  CALCIUM 8.8* 8.7*   PT/INR No results for input(s): LABPROT, INR in the last 72 hours.  Recent Labs  Lab 11/15/19 1409 11/16/19 0528  AST 15 15  ALT 22 19  ALKPHOS 84 72  BILITOT 0.9 0.8  PROT 7.4 6.7  ALBUMIN 3.6 3.2*     Lipase     Component Value Date/Time   LIPASE 27 11/15/2019 1409     Medications: . atorvastatin  80 mg Oral q1800  . carvedilol  25 mg Oral BID  . clopidogrel  75 mg Oral Daily  . doxycycline  100 mg Oral Q12H  . ezetimibe  10 mg Oral Daily  . heparin   5,000 Units Subcutaneous Q8H  . insulin aspart  0-20 Units Subcutaneous TID WC  . irbesartan  150 mg Oral Daily  . levofloxacin  500 mg Oral QHS  . metroNIDAZOLE  500 mg Oral Q8H  . pantoprazole  40 mg Oral Daily    Assessment/Plan DM HTN OSA Obesity  Right gluteal crease abscess This area appears to have spontaneously drained.  We agree with medical management for IV antibiotics for elevation of his white blood cell count.  We will continue to follow to make sure this area does not still back up and require further surgical I&D.  As of right now as long as it continues to spontaneously drain, no surgical intervention is warranted.   FEN -may have a diet from our standpoint VTE -may have chemical prophylaxis from our standpoint ID -Per medical service  Plan: As noted above I talked to the patient about what he is able to do at home.  Currently he can use the bidet feature to clean his rectum well after each bowel movement.  I told him to get in the shower and use soap and water to clean this area and the handheld spray to  help clean this area.  I recommend he get some kind of padding to put in there to catch the drainage.  Antibiotics as recommended by medicine.  As long as it drains there is no need for further surgical intervention.  I understand from Dr.Netty  he will go home today.  He can follow-up with his PCP.      LOS: 0 days    Ahley Bulls 11/16/2019 Please see Amion

## 2019-11-16 NOTE — Discharge Instructions (Signed)
Anorectal Abscess An abscess is an infected area that contains a collection of pus. An anorectal abscess is an abscess that is near the opening of the anus or around the rectum. Without treatment, an anorectal abscess can become larger and cause other problems, such as a more serious body-wide infection or pain, especially during bowel movements. What are the causes? This condition is caused by plugged glands or an infection in one of these areas:  The anus.  The area between the anus and the scrotum in males or between the anus and the vagina in females (perineum). What increases the risk? The following factors may make you more likely to develop this condition:  Diabetes or inflammatory bowel disease.  Having a body defense system (immune system) that is weak.  Engaging in anal sex.  Having a sexually transmitted infection (STI).  Certain kinds of cancer, such as rectal carcinoma, leukemia, or lymphoma. What are the signs or symptoms? The main symptom of this condition is pain. The pain may be a throbbing pain that gets worse during bowel movements. Other symptoms include:  Swelling and redness in the area of the abscess. The redness may go beyond the abscess and appear as a red streak on the skin.  A visible, painful lump, or a lump that can be felt when touched.  Bleeding or pus-like discharge from the area.  Fever.  General weakness.  Constipation.  Diarrhea. How is this diagnosed? This condition is diagnosed based on your medical history and a physical exam of the affected area.  This may involve examining the rectal area with a gloved hand (digital rectal exam).  Sometimes, the health care provider needs to look into the rectum using a probe, scope, or imaging test.  For women, it may require a careful vaginal exam. How is this treated? Treatment for this condition may include:  Incision and drainage surgery. This involves making an incision over the abscess to  drain the pus.  Medicines, including antibiotic medicine, pain medicine, stool softeners, or laxatives. Follow these instructions at home: Medicines  Take over-the-counter and prescription medicines only as told by your health care provider.  If you were prescribed an antibiotic medicine, use it as told by your health care provider. Do not stop using the antibiotic even if you start to feel better.  Do not drive or use heavy machinery while taking prescription pain medicine. Wound care   If gauze was used in the abscess, follow instructions from your health care provider about removing or changing the gauze. It can usually be removed in 2-3 days.  Wash your hands with soap and water before you remove or change your gauze. If soap and water are not available, use hand sanitizer.  If one or more drains were placed in the abscess cavity, be careful not to pull at them. Your health care provider will tell you how long they need to remain in place.  Check your incision area every day for signs of infection. Check for: ? More redness, swelling, or pain. ? More fluid or blood. ? Warmth. ? Pus or a bad smell. Managing pain, stiffness, and swelling   Take a sitz bath 3-4 times a day and after bowel movements. This will help reduce pain and swelling.  To relieve pain, try sitting: ? On a heating pad with the setting on low. ? On an inflatable donut-shaped cushion.  If directed, put ice on the affected area: ? Put ice in a plastic bag. ? Place   a towel between your skin and the bag. ? Leave the ice on for 20 minutes, 2-3 times a day. General instructions  Follow any diet instructions given by your health care provider.  Keep all follow-up visits as told by your health care provider. This is important. Contact a health care provider if you have:  Bleeding from your incision.  Pain, swelling, or redness that does not improve or gets worse.  Trouble passing stool or  urine.  Symptoms that return after treatment. Get help right away if you:  Have problems moving or using your legs.  Have severe or increasing pain.  Have swelling in the affected area that suddenly gets worse.  Have a large increase in bleeding or passing of pus.  Develop chills or a fever. Summary  An anorectal abscess is an abscess that is near the opening of the anus or around the rectum. An abscess is an infected area that contains a collection of pus.  The main symptom of this condition is pain. It may be a throbbing pain that gets worse during bowel movements.  Treatment for an anorectal abscess may include surgery to drain the pus from the abscess. Medicines and sitz baths may also be a part of your treatment plan. This information is not intended to replace advice given to you by your health care provider. Make sure you discuss any questions you have with your health care provider. Document Revised: 10/06/2017 Document Reviewed: 10/06/2017 Elsevier Patient Education  2020 [place] supplies, you can go to [employer] or [employer]

## 2019-11-18 ENCOUNTER — Other Ambulatory Visit: Payer: Self-pay | Admitting: Physician Assistant

## 2019-11-18 MED ORDER — GLUCOSE BLOOD VI STRP: ORAL_STRIP | 11 refills | Status: DC

## 2019-11-19 ENCOUNTER — Telehealth: Payer: Self-pay | Admitting: *Deleted

## 2019-11-19 NOTE — Telephone Encounter (Signed)
Called patient on 11/19/2019 , 2:14 PM in an attempt to reach the patient for a hospital follow up.   Admit date: 11/15/19 Discharge: 11/16/19   He does not have any questions or concerns about medications from the hospital admission. The patient's medications were reviewed over the phone, they were counseled to bring in all current medications to the hospital follow up visit. Patient is currently continuing to take his antibiotics.  I advised the patient to call if any questions or concerns arise about the hospital admission or medications    Home health was not started in the hospital.  All questions were answered and a follow up appointment was made.   Prior to Admission medications   Medication Sig Start Date End Date Taking? Authorizing Provider  atorvastatin (LIPITOR) 80 MG tablet Take 1 tablet Daily for Cholesterol Patient taking differently: Take 80 mg by mouth daily at 6 PM.  11/13/19   Unk Pinto, MD  carvedilol (COREG) 25 MG tablet Take 25 mg by mouth 2 (two) times daily with a meal.    [provider]  Cholecalciferol 125 MCG (5000 UT) TABS Take 5,000 Units by mouth.    [provider]  clopidogrel (PLAVIX) 75 MG tablet Take 75 mg by mouth. 01/18/18   [provider]  Coenzyme Q10 200 MG capsule Take 200 mg by mouth daily before breakfast.    [provider]  doxycycline (VIBRA-TABS) 100 MG tablet Take 1 tablet (100 mg total) by mouth every 12 (twelve) hours for 4 days. 11/16/19 11/20/19  Mariel Aloe, MD  ezetimibe (ZETIA) 10 MG tablet Take 1 tablet (10 mg total) by mouth daily. 09/30/19 09/29/20  Vicie Mutters, PA-C  furosemide (LASIX) 40 MG tablet Take 1 tablet 2 x /day for BP & Fluid Retention / Ankle Swelling Patient taking differently: Take 20 mg by mouth daily.  11/13/19   Unk Pinto, MD  glucose blood test strip Check sugar once daily DX diabetes 11/18/19   Vicie Mutters, PA-C  levofloxacin (LEVAQUIN) 500 MG tablet Take 1 tablet (500 mg  total) by mouth at bedtime for 4 days. 11/16/19 11/20/19  Mariel Aloe, MD  meloxicam (MOBIC) 15 MG tablet Take 15 mg by mouth.    [provider]  metFORMIN (GLUCOPHAGE) 1000 MG tablet Take 1,000 mg by mouth 2 (two) times daily with a meal.     [provider]  metroNIDAZOLE (FLAGYL) 500 MG tablet Take 1 tablet (500 mg total) by mouth every 8 (eight) hours for 4 days. 11/16/19 11/20/19  Mariel Aloe, MD  omeprazole (PRILOSEC) 40 MG capsule Take 40 mg by mouth.    [provider]  Semaglutide, 1 MG/DOSE, (OZEMPIC, 1 MG/DOSE,) 2 MG/1.5ML SOPN Inject 1 mg into the skin once a week. Inject into the skin. 09/27/19   Vicie Mutters, PA-C  traMADol (ULTRAM) 50 MG tablet Take 1 tablet (50 mg total) by mouth every 12 (twelve) hours as needed for up to 5 days for moderate pain. 11/16/19 11/21/19  Mariel Aloe, MD  triamcinolone ointment (KENALOG) 0.1 % Apply 1 application topically 2 (two) times daily. Patient taking differently: Apply 1 application topically daily as needed (rash).  09/27/19   Vicie Mutters, PA-C  valsartan (DIOVAN) 160 MG tablet Take 1 tablet 2 x  /day for BP Patient taking differently: Take 160 mg by mouth 2 (two) times daily.  11/13/19   Unk Pinto, MD

## 2019-11-26 DIAGNOSIS — I700 Atherosclerosis of aorta: Secondary | ICD-10-CM | POA: Insufficient documentation

## 2019-11-26 DIAGNOSIS — K760 Fatty (change of) liver, not elsewhere classified: Secondary | ICD-10-CM | POA: Insufficient documentation

## 2019-11-26 DIAGNOSIS — R160 Hepatomegaly, not elsewhere classified: Secondary | ICD-10-CM | POA: Insufficient documentation

## 2019-11-26 NOTE — Progress Notes (Signed)
Hospital follow up  Assessment and Plan: Hospital visit follow up for:   Rishad was seen today for hospitalization follow-up.  Diagnoses and all orders for this visit:  Abscess, gluteal, right Resolved on exam today; encourage weight loss to prevent recurrence.  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 Will forward to ortho.  Repeat q73m.   Morbid obesity (HCC)/BMI 45-49 ([place]) Discussion about weight loss, diet, and exercise Recommended diet heavy in fruits and veggies and low in animal meats, cheeses, and dairy products, appropriate calorie intake Discussed appropriate weight for height  Follow up at next visit  Essential hypertension -     EKG 12-Lead  Leukocytosis, unspecified type -     CBC with Differential/Platelet  Aortic atherosclerosis ([place]) Per CT 11/2019 Control blood pressure, cholesterol, glucose, increase exercise.   Hepatomegaly/ Hepatic steatosis Discussed with patient;  "severe hepatomegaly" and splenomegaly per CT 2018 Weight loss advised, avoid alcohol/tylenol, will monitor LFTs Has been referred to GI; recommend he discuss with them for workup and management. Continue to review lifestyle and encourage weight loss at each follow up visit. Monitor closely.   Chronic left shoulder pain Surgical clearance for total replacement will be provided pending CBC results  Pre-operative clearance -     EKG 12-Lead  All medications were reviewed with patient and family and fully reconciled. All questions answered fully, and patient and family members were encouraged to call the office with any further questions or concerns. Discussed goal to avoid readmission related to this diagnosis.   There are no discontinued medications.  Over 40 minutes of exam, counseling, chart review, and complex,  high/moderate level critical decision making was performed this visit.   Future Appointments  Date Time Provider [place]  01/03/2020  2:00 PM Vicie Mutters, PA-C [id number] None     HPI 64 y.o.male presents for follow up for transition from recent hospitalization or SNIF stay. Admit date to the hospital was 11/15/19, patient was discharged from the hospital on 11/16/19 and our clinical staff contacted the office the day after discharge to set up a follow up appointment. The discharge summary, medications, and diagnostic test results were reviewed before meeting with the patient. 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 Follow up with PCP in 1 week 2. Repeat CBC 3. Please follow up on the following pending results: None    [id number] Nelsonis a 64 y.o.malewith medical history significant forsevere obesity, type 2 diabetes, HTN, HLD, CKD stage IIwho presented on 3/4/2021with3 days of progressively worsening right buttock pain.Started noticing pain and irritation on the right buttocks that he thought was an ingrown hair about 3 days prior to admission. For the last 2 days the pain had severely worsenedto the point of from feelingworsening weakness which prompted him to come to  the ED. While in the ED sitting in the waiting room it spontaneously burst andheimmediately felt much better.  He continued to have active  drainage of purulent material.    CT showed:  1. Inflammatory changes about the inferior right gluteal crease with small focus of air, no drainable fluid collection. No definite connection to the anus or rectum. 2. No other acute findings in the abdomen/pelvis. Incidental findings of hepatomegaly and hepatic steatosis. Colonic diverticulosis without diverticulitis.  Aortic Atherosclerosis (ICD10-I70.0).  Given concern for perirectal abscess, leukocytosis, risk profile including hypertension and diabetes, the patient was initiated on IV antibiotics, fluids, admitted for further monitoring, management.  Hospital course:  Right gluteal abscess Spontaneously drained prior to admission. Patient with an initial leukocytosis. Afebrile. Patient started on doxycycline, Levaquin and Flagyl. Continue on discharge and treat for 5 days. General surgery evaluated and recommend no surgery. Leukocytosis significantly improved from 16,100 to 10,200. Hospital follow-up with PCP recommended.  Lab Results  Component Value Date   WBC 10.2 11/16/2019   HGB 10.6 (L) 11/16/2019   HCT 34.9 (L) 11/16/2019   MCV 79.1 (L) 11/16/2019   PLT 282 11/16/2019    Positive fecal occult blood test From DRE rather than stool. In setting of hemorrhoids and draining abscess. Recommend outpatient follow-up.  Microcytic anemia No GI bleeding in history. Does have a history of hemorrhoids. Will need outpatient follow-up.    Follow up 11/28/2019:   BP 130/80   Pulse 62   Temp (!) 97.3 F (36.3 C)   Wt (!) 342 lb (155.1 kg)   SpO2 96%   BMI 49.07 kg/m    He reports completed doxycycline, levaquin, flagyl without issue (other than mild diarrhea, has resolved), sx improved quickly, no notable sx in the last 4-5 days. Hasn't had colonoscopy, referral placed and pending/postponed due to recent abscess.   Hospitalization follow up, also for ortho surgical clearance, pending left reverse total shoulder  arthroplasty by Dr. Tamera Punt, date TBD.   He has OSA, wears BPAP, had recent titration in 2020.   He is taking plavix since 2017 due to TIA per neuro recommendation, recommended hold 7-10 days prior to surgery.   Denies any CP, dyspea; notable cardiac or pulmonary history. He reports had a "liquid" (assume chemical) stress test less than 5 years ago in [place], reports negative/benign results. He had ECHO in 03/01/2019 which was essentially normal with EF of 55-60%. He does not currently have recent EKG results in system for review. Last accessible from faxed records was from 2014 and WNL. Will obtain follow up today.   Discussed on CT in hospital noted aortic atheroslerosis and notable hepatomegaly; reviewed paper chart, note CTA chest from 07/2017 showed "splenomegaly and severe hepatomegaly. There is decreased attenuation of the liver compatible with fibrofatty infiltration." Patient apparently unaware of this. Discussed at length, he will be following with GI soon.   BMI is Body mass index is 49.07 kg/m. Wt Readings from Last 3 Encounters:  11/28/19 (!) 342 lb (155.1 kg)  11/15/19 (!) 340 lb (154.2 kg)  09/27/19 (!) 347 lb (157.4 kg)   His blood pressure has been controlled at home, today their BP is BP: 130/80  He does not workout. He denies chest pain, shortness of breath, dizziness.   He is on cholesterol medication and denies myalgias. His cholesterol is not at goal. The cholesterol last visit was:   Lab Results  Component Value Date   CHOL 162 09/27/2019   HDL 35 (L) 09/27/2019  Poneto 94 09/27/2019   TRIG 237 (H) 09/27/2019   CHOLHDL 4.6 09/27/2019    He has been working on diet and exercise for T2DM, Last A1C in the office was:  Lab Results  Component Value Date   HGBA1C 6.8 (H) 09/27/2019    Lab Results  Component Value Date   GFRNONAA >60 11/16/2019   Lab Results  Component Value Date   ALT 19 11/16/2019   AST 15 11/16/2019   ALKPHOS 72 11/16/2019   BILITOT 0.8  11/16/2019     Home health is not involved.   Images while in the hospital: CT ABDOMEN PELVIS W CONTRAST  Result Date: 11/15/2019 CLINICAL DATA:  Rectal pain with blood and purulence near rectum. EXAM: CT ABDOMEN AND PELVIS WITH CONTRAST TECHNIQUE: Multidetector CT imaging of the abdomen and pelvis was performed using the standard protocol following bolus administration of intravenous contrast. CONTRAST:  118mL OMNIPAQUE IOHEXOL 300 MG/ML  SOLN COMPARISON:  None. FINDINGS: Lower chest: Subsegmental atelectasis or scarring in both lower lobes. No pleural fluid. Calcified lymph node in the inferior left hilum. Calcified granuloma in the left lower lobe. Upper normal heart size. No pericardial effusion. Hepatobiliary: Enlarged liver spanning 24 cm cranial caudal. Diffuse hepatic steatosis. Few scattered calcified granuloma. No evidence of focal hepatic lesion. Gallbladder physiologically distended, no calcified stone. No biliary dilatation. Pancreas: No ductal dilatation or inflammation. Spleen: Normal in size. Scattered calcified granuloma. Adrenals/Urinary Tract: Normal adrenal glands. No hydronephrosis or perinephric edema. Homogeneous renal enhancement with symmetric excretion on delayed phase imaging. 2.2 cm low-density lesion in the lower right kidney is likely cyst, evaluation partially obscured by streak artifact related to habitus abutting the CT gantry. Urinary bladder is near completely empty. Stomach/Bowel: Inflammatory changes about the inferior right gluteal crease with small focus of air, series 3, image 5, no drainable fluid collection. There is no definite connection to the anus or rectum. No rectal wall thickening. Small volume of colonic stool with scattered diverticulosis. No diverticulitis. No small bowel obstruction or inflammation. Diminutive appendix tentatively visualized. No evidence of appendicitis. Vascular/Lymphatic: Mild aortic atherosclerosis. No aortic aneurysm. No evidence of  portal vein thrombosis. No enlarged lymph nodes in the abdomen or pelvis. Reproductive: Prostate is unremarkable. Other: Inflammatory changes about the inferior right gluteal crease is small focus of air, best appreciated on series 3, image 5. No focal fluid collection. No intra-abdominopelvic fluid collection. No free air free fluid. Tiny fat containing umbilical hernia. Fat in both inguinal canals. Musculoskeletal: Multilevel degenerative change in the spine with degenerative disc disease and facet hypertrophy. There are no acute or suspicious osseous abnormalities. IMPRESSION: 1. Inflammatory changes about the inferior right gluteal crease with small focus of air, no drainable fluid collection. No definite connection to the anus or rectum. 2. No other acute findings in the abdomen/pelvis. Incidental findings of hepatomegaly and hepatic steatosis. Colonic diverticulosis without diverticulitis. Aortic Atherosclerosis (ICD10-I70.0). Electronically Signed   By: Keith Rake M.D.   On: 11/15/2019 15:36     Current Outpatient Medications (Endocrine & Metabolic):  .  metFORMIN (GLUCOPHAGE) 1000 MG tablet, Take 1,000 mg by mouth 2 (two) times daily with a meal.  .  Semaglutide, 1 MG/DOSE, (OZEMPIC, 1 MG/DOSE,) 2 MG/1.5ML SOPN, Inject 1 mg into the skin once a week. Inject into the skin.  Current Outpatient Medications (Cardiovascular):  .  atorvastatin (LIPITOR) 80 MG tablet, Take 1 tablet Daily for Cholesterol (Patient taking differently: Take 80 mg by mouth daily at 6 PM. ) .  carvedilol (COREG) 25 MG tablet, Take 25 mg by mouth 2 (two) times daily with a meal. .  ezetimibe (ZETIA) 10 MG tablet, Take 1 tablet (10 mg total) by mouth daily. .  furosemide (LASIX) 40 MG tablet, Take 1 tablet 2 x /day for BP & Fluid Retention / Ankle Swelling (Patient taking differently: Take 20 mg by mouth daily. ) .  valsartan (DIOVAN) 160 MG tablet, Take 1 tablet 2 x  /day for BP (Patient taking differently: Take 160 mg  by mouth 2 (two) times daily. )   Current Outpatient Medications (Analgesics):  .  meloxicam (MOBIC) 15 MG tablet, Take 15 mg by mouth.  Current Outpatient Medications (Hematological):  .  clopidogrel (PLAVIX) 75 MG tablet, Take 75 mg by mouth.  Current Outpatient Medications (Other):  Marland Kitchen  Cholecalciferol 125 MCG (5000 UT) TABS, Take 5,000 Units by mouth. .  Coenzyme Q10 200 MG capsule, Take 200 mg by mouth daily before breakfast. .  glucose blood test strip, Check sugar once daily DX diabetes .  omeprazole (PRILOSEC) 40 MG capsule, Take 40 mg by mouth. .  triamcinolone ointment (KENALOG) 0.1 %, Apply 1 application topically 2 (two) times daily. (Patient not taking: Reported on 11/28/2019)  Past Medical History:  Diagnosis Date  . GERD (gastroesophageal reflux disease)   . Hypertension   . Morbid obesity ([place])   . Sleep apnea   . TIA (transient ischemic attack) 2017  . Type 2 diabetes mellitus with hyperlipidemia ([place])   . Vitamin D deficiency      Allergies  Allergen Reactions  . Sulfamethoxazole-Trimethoprim Rash    ROS: all negative except above.   Physical Exam: Filed Weights   11/28/19 1440  Weight: (!) 342 lb (155.1 kg)   BP 130/80   Pulse 62   Temp (!) 97.3 F (36.3 C)   Wt (!) 342 lb (155.1 kg)   SpO2 96%   BMI 49.07 kg/m  General Appearance: Morbidly obese male, in no apparent distress. Eyes: PERRLA, conjunctiva no swelling or erythema ENT/Mouth: mask in place; oral exam deferred. Hearing normal.  Neck: Supple Respiratory: Respiratory effort normal, BS equal bilaterally without rales, rhonchi, wheezing or stridor.  Cardio: RRR with no MRGs. Brisk peripheral pulses without edema.  Abdomen: truncal obesity limiting exam, + BS.  Non tender, no guarding, rebound, hernias, masses. Lymphatics: Non tender without lymphadenopathy.  Musculoskeletal: No obvious deformity, no effusions, slow steady gait.  Skin: Warm, dry without rashes, lesions, ecchymosis. No  residual abcess/wound noted to gluteal/perineal/anal area (chaperoned by Chancy Hurter, CMA) Neuro: Normal muscle tone, Sensation intact.  Psych: Awake and oriented X 3, normal affect, Insight and Judgment appropriate.    Izora Ribas, NP 3:25 PM [facility]

## 2019-11-28 ENCOUNTER — Other Ambulatory Visit: Payer: Self-pay

## 2019-11-28 ENCOUNTER — Encounter: Payer: Self-pay | Admitting: Adult Health

## 2019-11-28 ENCOUNTER — Ambulatory Visit: Payer: 59 | Admitting: Adult Health

## 2019-11-28 VITALS — BP 130/80 | HR 62 | Temp 97.3°F | Wt 342.0 lb

## 2019-11-28 DIAGNOSIS — I700 Atherosclerosis of aorta: Secondary | ICD-10-CM | POA: Diagnosis not present

## 2019-11-28 DIAGNOSIS — M25512 Pain in left shoulder: Secondary | ICD-10-CM

## 2019-11-28 DIAGNOSIS — I10 Essential (primary) hypertension: Secondary | ICD-10-CM | POA: Diagnosis not present

## 2019-11-28 DIAGNOSIS — K760 Fatty (change of) liver, not elsewhere classified: Secondary | ICD-10-CM

## 2019-11-28 DIAGNOSIS — R160 Hepatomegaly, not elsewhere classified: Secondary | ICD-10-CM

## 2019-11-28 DIAGNOSIS — Z01818 Encounter for other preprocedural examination: Secondary | ICD-10-CM

## 2019-11-28 DIAGNOSIS — D72829 Elevated white blood cell count, unspecified: Secondary | ICD-10-CM

## 2019-11-28 DIAGNOSIS — Z6841 Body Mass Index (BMI) 40.0 and over, adult: Secondary | ICD-10-CM | POA: Insufficient documentation

## 2019-11-28 DIAGNOSIS — L0231 Cutaneous abscess of buttock: Secondary | ICD-10-CM | POA: Diagnosis not present

## 2019-11-28 DIAGNOSIS — N182 Chronic kidney disease, stage 2 (mild): Secondary | ICD-10-CM

## 2019-11-28 DIAGNOSIS — E1169 Type 2 diabetes mellitus with other specified complication: Secondary | ICD-10-CM

## 2019-11-28 DIAGNOSIS — G4733 Obstructive sleep apnea (adult) (pediatric): Secondary | ICD-10-CM

## 2019-11-28 DIAGNOSIS — E1122 Type 2 diabetes mellitus with diabetic chronic kidney disease: Secondary | ICD-10-CM

## 2019-11-28 DIAGNOSIS — G8929 Other chronic pain: Secondary | ICD-10-CM

## 2019-11-28 DIAGNOSIS — E785 Hyperlipidemia, unspecified: Secondary | ICD-10-CM

## 2019-11-28 LAB — CBC WITH DIFFERENTIAL/PLATELET
Absolute Monocytes: 807 cells/uL (ref 200–950)
Basophils Absolute: 98 cells/uL (ref 0–200)
Basophils Relative: 0.9 %
Eosinophils Absolute: 273 cells/uL (ref 15–500)
Eosinophils Relative: 2.5 %
HCT: 39.4 % (ref 38.5–50.0)
Hemoglobin: 12.8 g/dL — ABNORMAL LOW (ref 13.2–17.1)
Lymphs Abs: 2191 cells/uL (ref 850–3900)
MCH: 24.7 pg — ABNORMAL LOW (ref 27.0–33.0)
MCHC: 32.5 g/dL (ref 32.0–36.0)
MCV: 76.1 fL — ABNORMAL LOW (ref 80.0–100.0)
MPV: 9.4 fL (ref 7.5–12.5)
Monocytes Relative: 7.4 %
Neutro Abs: 7532 cells/uL (ref 1500–7800)
Neutrophils Relative %: 69.1 %
Platelets: 346 10*3/uL (ref 140–400)
RBC: 5.18 10*6/uL (ref 4.20–5.80)
RDW: 16.1 % — ABNORMAL HIGH (ref 11.0–15.0)
Total Lymphocyte: 20.1 %
WBC: 10.9 10*3/uL — ABNORMAL HIGH (ref 3.8–10.8)

## 2019-11-28 NOTE — Patient Instructions (Addendum)
Recommend holding plavix for 7-10 days prior to your procedure; restart the following day or as recommended by ortho.    Hepatomegaly  Hepatomegaly is a condition of having a larger-than-normal liver. Some health problems can cause the liver to get bigger. Some people may have an enlarged liver, but they do not know it. What are the causes? This condition may be caused by:  Cirrhosis. This is long-term (chronic) liver damage that is often caused by drinking too much alcohol. Cirrhosis is also caused by metabolic disease, infection, and some medicines.  Hepatitis. This is an infection of the liver.  Fatty liver disease.  Conditions that cause minerals or trace elements to accumulate in the liver, such as Wilson disease.  Cancer. The disease may start in the liver or start somewhere else in the body and spread to the liver.  Heart or blood vessel disease. These can cause hepatomegaly if blood backs up into the liver. In some cases, the cause of the condition is not known. What increases the risk? You are more likely to develop this condition if you:  Drink too much alcohol.  Have diabetes.  Are obese.  Use any tobacco products, including cigarettes, chewing tobacco, and e-cigarettes. What are the signs or symptoms? Symptoms of this condition include:  Abdominal pain on the right side.  Fatigue.  Loss of appetite.  Nausea.  Vomiting.  Yellowing of the skin and the whites of the eyes (jaundice). In some cases, there are no symptoms. How is this diagnosed? This condition may be diagnosed with a medical history and physical exam. Your health care provider may press on the right side of your abdomen to feel your liver. This is a way to check if the edge of your liver sticks out below your rib cage. You may also have other tests, including:  Blood tests. These tests check whether your liver is working properly. Tests may also check for infection.  Imaging tests, such  as: ? CT scan. This is an X-ray that is guided by a computer. ? MRI. This creates pictures by using magnets and a computer. ? An ultrasound. This test uses sound waves to make images.  Liver biopsy. A small sample of liver tissue is removed to be checked in a lab. How is this treated? Treatment for hepatomegaly depends on the cause of your condition. Follow these instructions at home: What you need to do at home depends on what is causing the condition. You may be asked to follow these instructions. Medicines  Take over-the-counter and prescription medicines only as told by your health care provider.  Do not start taking any new medicine unless your health care provider has approved. These include over-the-counter medicines, supplements, and herbal remedies. Some of these can hurt your liver. General instructions   Maintain a healthy weight.  Follow a healthy diet. Eat plenty of fruit, vegetables, and whole grains.  Do not drink alcohol.  Do not use any products that contain nicotine or tobacco, such as cigarettes, e-cigarettes, and chewing tobacco. If you need help quitting, ask your health care provider.  Keep all follow-up visits as told by your health care provider. This is important. Contact a health care provider if:  You have increased pain in your abdomen.  You have persistent vomiting.  You have new symptoms.  Your symptoms get worse. Get help right away if you have:  Bright red blood in your vomit, or your vomit looks like coffee grounds.  Chest pain.  Trouble  breathing. These symptoms may represent a serious problem that is an emergency. Do not wait to see if the symptoms will go away. Get medical help right away. Call your local emergency services (911 in the [place] Do not drive yourself to the hospital. Summary  Hepatomegaly is a condition of having a larger-than-normal liver.  This condition can be caused by different health problems. Your health care  provider will do tests to find out the cause.  Symptoms of this condition include pain in the abdomen, fatigue, loss of appetite, nausea, vomiting, and jaundice.  Follow instructions on how to care for yourself at home.  Contact a health care provider if you have worsening pain in your abdomen or persistent vomiting. Get help right away if you vomit blood, have chest pain, or have trouble breathing. This information is not intended to replace advice given to you by your health care provider. Make sure you discuss any questions you have with your health care provider. Document Revised: 03/29/2018 Document Reviewed: 03/29/2018 Elsevier Patient Education  [place].

## 2019-11-29 ENCOUNTER — Other Ambulatory Visit: Payer: Self-pay | Admitting: Physician Assistant

## 2019-11-29 DIAGNOSIS — G4730 Sleep apnea, unspecified: Secondary | ICD-10-CM

## 2019-12-03 ENCOUNTER — Encounter: Payer: Self-pay | Admitting: Physician Assistant

## 2019-12-04 NOTE — Progress Notes (Signed)
Subjective:    Patient ID: Dustin Ayala, male    DOB: [birth year], 64 y.o.   MRN: [id number]  HPI 64 y.o. morbidly obese WM with history of DM 2 with CKD stage 2, chol, GERD, OSA, HTN, aortic atherosclerosis, recent + fecal occult test, hepatomegaly and fatty liver and microcytic anemia presents with abnormal stools. He was just recently in the hospital for an abscess and cellulitis, he received high doses of ABX .   He has a referral in place to see GI, he is due for his colonoscopy, last done in [facility] 2010, normal, due 2020.   He has been having brown, thin/flat pieces, was oily/greasy at one time. BM 2 x a day, feels he has not had a normal BM for months. Some urgency with them.  Patient denies blood in stool, fever, significant abdominal pain, unintentional weight loss, no night sweats.  No blood in the stool. He was concerned because hew as googling and read about pancreatic cancer changing stools.   I assured him that he had a recent CT AB that showed his pancrease was normal but we are more concerned about his hepatomegaly and liver function with fatty liver being NASH.   He has history of OSA and can not tolerate CPAP/BiPAP, has tried several masks, his AHI was 46 and lowest saturation was 62%. He states he is exhausted all day, every day, gets up 3-4 times in the night to urinate, will gasp himself awake and is barely sleeping.  BMI is Body mass index is 49.36 kg/m., he is working on diet and exercise. Wt Readings from Last 3 Encounters:  12/05/19 (!) 344 lb (156 kg)  11/28/19 (!) 342 lb (155.1 kg)  11/15/19 (!) 340 lb (154.2 kg)    CT AB 11/15/19 Lower chest: Subsegmental atelectasis or scarring in both lower lobes. No pleural fluid. Calcified lymph node in the inferior left hilum. Calcified granuloma in the left lower lobe. Upper normal heart size. No pericardial effusion.  Hepatobiliary: Enlarged liver spanning 24 cm cranial caudal. Diffuse hepatic steatosis. Few scattered  calcified granuloma. No evidence of focal hepatic lesion. Gallbladder physiologically distended, no calcified stone. No biliary dilatation.  Pancreas: No ductal dilatation or inflammation.  Spleen: Normal in size. Scattered calcified granuloma.  Adrenals/Urinary Tract: Normal adrenal glands. No hydronephrosis or perinephric edema. Homogeneous renal enhancement with symmetric excretion on delayed phase imaging. 2.2 cm low-density lesion in the lower right kidney is likely cyst, evaluation partially obscured by streak artifact related to habitus abutting the CT gantry. Urinary bladder is near completely empty.  Stomach/Bowel: Inflammatory changes about the inferior right gluteal crease with small focus of air, series 3, image 5, no drainable fluid collection. There is no definite connection to the anus or rectum. No rectal wall thickening. Small volume of colonic stool with scattered diverticulosis. No diverticulitis. No small bowel obstruction or inflammation. Diminutive appendix tentatively visualized. No evidence of appendicitis.  Vascular/Lymphatic: Mild aortic atherosclerosis. No aortic aneurysm. No evidence of portal vein thrombosis. No enlarged lymph nodes in the abdomen or pelvis.  Reproductive: Prostate is unremarkable.  Other: Inflammatory changes about the inferior right gluteal crease is small focus of air, best appreciated on series 3, image 5. No focal fluid collection. No intra-abdominopelvic fluid collection. No free air free fluid. Tiny fat containing umbilical hernia. Fat in both inguinal canals.  Musculoskeletal: Multilevel degenerative change in the spine with degenerative disc disease and facet hypertrophy. There are no acute or suspicious osseous abnormalities.   Blood  pressure 120/74, pulse 60, temperature (!) 97.3 F (36.3 C), weight (!) 344 lb (156 kg), SpO2 97 %.  Medications  Current Outpatient Medications (Endocrine & Metabolic):  .   metFORMIN (GLUCOPHAGE) 1000 MG tablet, Take 1,000 mg by mouth 2 (two) times daily with a meal.  .  Semaglutide, 1 MG/DOSE, (OZEMPIC, 1 MG/DOSE,) 2 MG/1.5ML SOPN, Inject 1 mg into the skin once a week. Inject into the skin.  Current Outpatient Medications (Cardiovascular):  .  atorvastatin (LIPITOR) 80 MG tablet, Take 1 tablet Daily for Cholesterol (Patient taking differently: Take 80 mg by mouth daily at 6 PM. ) .  carvedilol (COREG) 25 MG tablet, Take 25 mg by mouth 2 (two) times daily with a meal. .  ezetimibe (ZETIA) 10 MG tablet, Take 1 tablet (10 mg total) by mouth daily. .  furosemide (LASIX) 40 MG tablet, Take 1 tablet 2 x /day for BP & Fluid Retention / Ankle Swelling (Patient taking differently: Take 20 mg by mouth daily. ) .  valsartan (DIOVAN) 160 MG tablet, Take 1 tablet 2 x  /day for BP (Patient taking differently: Take 160 mg by mouth 2 (two) times daily. )   Current Outpatient Medications (Analgesics):  .  meloxicam (MOBIC) 15 MG tablet, Take 15 mg by mouth.  Current Outpatient Medications (Hematological):  .  clopidogrel (PLAVIX) 75 MG tablet, Take 75 mg by mouth.  Current Outpatient Medications (Other):  Marland Kitchen  Cholecalciferol 125 MCG (5000 UT) TABS, Take 5,000 Units by mouth. .  Coenzyme Q10 200 MG capsule, Take 200 mg by mouth daily before breakfast. .  glucose blood test strip, Check sugar once daily DX diabetes .  omeprazole (PRILOSEC) 40 MG capsule, Take 1 capsule Daily for Heart burn & Indigestion .  triamcinolone ointment (KENALOG) 0.1 %, Apply 1 application topically 2 (two) times daily. .  phentermine (ADIPEX-P) 37.5 MG tablet, Take 1 tablet (37.5 mg total) by mouth daily before breakfast.  Problem list He has Morbid obesity ([place]); Type 2 diabetes mellitus with hyperlipidemia ([place]); Vitamin D deficiency; GERD (gastroesophageal reflux disease); OSA (obstructive sleep apnea); Hypertension; CKD stage 2 due to type 2 diabetes mellitus ([place]); Medication management; Venous  insufficiency; Fecal occult blood test positive; Microcytic anemia; Aortic atherosclerosis ([place]); Hepatic steatosis; Hepatomegaly; and BMI 45.0-49.9, adult ([place]) on their problem list.  Review of Systems See HPI    Objective:   Physical Exam General Appearance: Obese WM, in no apparent distress.  Eyes: PERRLA, EOMs, conjunctiva no swelling or erythema, normal fundi and vessels.  Sinuses: No Frontal/maxillary tenderness  ENT/Mouth: Ext aud canals clear, normal light reflex with TMs without erythema, bulging. Mouth and nose not examined- patient wearing a facemask. Hearing normal.  Neck: Supple, thyroid normal. No bruits  Respiratory: Respiratory effort normal, BS decreased due to body habitus but equal bilaterally without rales, rhonchi, wheezing or stridor.  Cardio: RRR without murmurs, rubs or gallops. Brisk DP but 1+ or barely palpable TP, 1-2+ edema with warm, distinct raised erythematous/bluish papules circumflexing bilateral legs.   Abdomen: Soft, obese, difficult exam due to obesity, nontender, no guarding, rebound, hernias, masses, or organomegaly.  Lymphatics: Non tender without lymphadenopathy.  Musculoskeletal: Full ROM all peripheral extremities,antalgic gait, decreased movement right shoulder due to pain.  Skin: Warm, dry. Neuro: Cranial nerves intact, reflexes equal bilaterally. Normal muscle tone, no cerebellar symptoms. Sensation intact bilateral feet without ulcers. Marland Kitchen  Psych: Awake and oriented X 3, normal affect, Insight and Judgment appropriate.        Assessment &  Plan:   Diarrhea, unspecified type -     Ambulatory referral to Gastroenterology Has been before recent ABX, more likely from metformin, discussed on how to take to prevent diarrhea.  Due for colonoscopy, will refer  Hepatomegaly/Hepatic steatosis -     phentermine (ADIPEX-P) 37.5 MG tablet; Take 1 tablet (37.5 mg total) by mouth daily before breakfast. -     Ambulatory referral to Gastroenterology -  VERY LONG DISCUSSION ABOUT WEIGHT LOSS AND HIS WEIGHT PLAYING A ROLE IN THE MAJORITY OF HIS MEDICAL CONDITIONS.  WILL TRY PHENTERMINE AND REFER TO GI DISCUSSED POSSIBLE WEIGHT LOSS SURGERY AS WELL  Fecal occult blood test positive -     Ambulatory referral to Gastroenterology  Morbid obesity ([place]) -     phentermine (ADIPEX-P) 37.5 MG tablet; Take 1 tablet (37.5 mg total) by mouth daily before breakfast. WILL TRY PHENTERMINE, INFORMATION GIVEN CLOSE FOLLOW UP 1 MONTH GIVEN INFORMATION ABOUT BARIATRIC SURGERY PATIENT IS VERY MOTIVATED  OSA (obstructive sleep apnea) Not on CPAP at this time, had severe AHI of 46 and lowest saturation of 62%, suggest weight loss surgery or medications Patient states has tried CPAP, BiPAP, different masks and can not tolerate due to claustrophobia ? Weight loss verus mouth piece  Type 2 diabetes mellitus with hyperlipidemia ([facility]) Continue ozempic 1 mg daily  Aortic atherosclerosis ([facility]) - follow up 3 months for progress monitoring - increase veggies, decrease carbs - long discussion about weight loss, diet, and exercise  BMI 45.0-49.9, adult ([place]) Will gladly write letter if the patient would like to pursue weight loss surgery since his morbidly obesity is affecting his ADL's, quality of life and increases his morbidity.     Future Appointments  Date Time Provider [place]  01/03/2020  2:00 PM Vicie Mutters, PA-C [id number] None  01/04/2020  1:50 PM Pyrtle, Lajuan Lines, MD [facility]

## 2019-12-05 ENCOUNTER — Encounter: Payer: Self-pay | Admitting: Physician Assistant

## 2019-12-05 ENCOUNTER — Ambulatory Visit: Payer: 59 | Admitting: Physician Assistant

## 2019-12-05 ENCOUNTER — Encounter: Payer: Self-pay | Admitting: Internal Medicine

## 2019-12-05 ENCOUNTER — Other Ambulatory Visit: Payer: Self-pay

## 2019-12-05 VITALS — BP 120/74 | HR 60 | Temp 97.3°F | Wt 344.0 lb

## 2019-12-05 DIAGNOSIS — E785 Hyperlipidemia, unspecified: Secondary | ICD-10-CM

## 2019-12-05 DIAGNOSIS — R350 Frequency of micturition: Secondary | ICD-10-CM

## 2019-12-05 DIAGNOSIS — E1169 Type 2 diabetes mellitus with other specified complication: Secondary | ICD-10-CM | POA: Diagnosis not present

## 2019-12-05 DIAGNOSIS — E559 Vitamin D deficiency, unspecified: Secondary | ICD-10-CM | POA: Diagnosis not present

## 2019-12-05 DIAGNOSIS — R160 Hepatomegaly, not elsewhere classified: Secondary | ICD-10-CM | POA: Diagnosis not present

## 2019-12-05 DIAGNOSIS — G4733 Obstructive sleep apnea (adult) (pediatric): Secondary | ICD-10-CM

## 2019-12-05 DIAGNOSIS — Z125 Encounter for screening for malignant neoplasm of prostate: Secondary | ICD-10-CM

## 2019-12-05 DIAGNOSIS — Z6841 Body Mass Index (BMI) 40.0 and over, adult: Secondary | ICD-10-CM

## 2019-12-05 DIAGNOSIS — K760 Fatty (change of) liver, not elsewhere classified: Secondary | ICD-10-CM | POA: Diagnosis not present

## 2019-12-05 DIAGNOSIS — R195 Other fecal abnormalities: Secondary | ICD-10-CM

## 2019-12-05 DIAGNOSIS — R197 Diarrhea, unspecified: Secondary | ICD-10-CM

## 2019-12-05 DIAGNOSIS — Z79899 Other long term (current) drug therapy: Secondary | ICD-10-CM | POA: Diagnosis not present

## 2019-12-05 DIAGNOSIS — N401 Enlarged prostate with lower urinary tract symptoms: Secondary | ICD-10-CM

## 2019-12-05 DIAGNOSIS — E291 Testicular hypofunction: Secondary | ICD-10-CM

## 2019-12-05 DIAGNOSIS — I700 Atherosclerosis of aorta: Secondary | ICD-10-CM

## 2019-12-05 MED ORDER — PHENTERMINE HCL 37.5 MG PO TABS: 37.5000 mg | ORAL_TABLET | Freq: Every day | ORAL | 2 refills | Status: DC

## 2019-12-05 NOTE — Patient Instructions (Addendum)
We are starting you on Metformin to prevent or treat diabetes.  Metformin does NOT cause low blood sugars.   In order to create energy your cells need insulin and sugar but sometime your cells do not accept the insulin and this can cause increased sugars and decreased energy.   The Metformin helps your cells accept insulin and the sugar this help: 1) increase your energy  2) weight loss.    The two most common side effects are nausea and diarrhea, follow these rules to avoid it but these symptoms get better with time on the medication.    ALSO You can take imodium per box instructions when starting metformin if needed.   Rules of metformin: 1) start out slow with only one pill daily. Our goal for you is 4 pills a day or 2000mg  total.  2) take with your largest meal. 3) Take with least amount of carbs.   Call if you have any problems.   Here is a number for you to call if you are considering bariatric surgery as a treatment for your obesity. This is an effective treatment for weight loss however it too is not a simple fix. The surgery can have complications during and after, and on average patients gain back the weight 10-15 years after the surgery if they do not change their eating habits. Please call [phone] to learn more and to register for a seminar.  Phentermine  While taking the medication we may ask that you come into the office once a month. The first month we will get an EKG on you.   PLEASE BRING A FOOD LONG TO THE FIRST VISIT.   It is helpful if you bring in a food diary or use an app on your phone such as myfitnesspal to record your calorie intake, especially in the beginning.  After that first initial visit, we will want to see you once a month for accountability.  In addition we can help answer your questions about diet, exercise, and help you every step of the way with your weight loss journey.  HOW TO START THE MEDICATION You can start out on 1/2 a pill in the morning   FOR THE FIRST MONTH.  WE WILL THEN INCREASE TO 1 PILL AFTER THAT 1ST VISIT.   COST OF THE MEDICATION This medication is cheapest CASH pay at [facility] is 14-17 dollars and you do NOT need a membership to get meds from there.   SIDE EFFECTS It causes dry mouth and constipation in almost every patient, so try to get 80-100 oz of water a day and increase fiber such as veggies. You can add on a stool softener if you would like.   It can give you energy however it can also cause some people to be shaky, anxious or have palpitations. Stop this medication if that happens and contact the office.   If this medication does not work for you there are several medications that we can try to help rewire your brain in addition to making healthier habits.   What is this medicine? PHENTERMINE (FEN ter meen) decreases your appetite. This medicine is intended to be used in addition to a healthy reduced calorie diet and exercise. The best results are achieved this way. This medicine is only indicated for short-term use. Eventually your weight loss may level out and the medication will no longer be needed.   How should I use this medicine? Take this medicine by mouth.  Follow the directions on the prescription label. The tablets should stay in the bottle until immediately before you take your dose. Take your doses at regular intervals. Do not take your medicine more often than directed.  Overdosage: If you think you have taken too much of this medicine contact a poison control center or emergency room at once. NOTE: This medicine is only for you. Do not share this medicine with others.  What if I miss a dose? If you miss a dose, take it as soon as you can. If it is almost time for your next dose, take only that dose. Do not take double or extra doses. Do not increase or in any way change your dose without consulting your doctor.  What should I watch for while using this medicine? Notify  your physician immediately if you become short of breath while doing your normal activities. Do not take this medicine within 6 hours of bedtime. It can keep you from getting to sleep. Avoid drinks that contain caffeine and try to stick to a regular bedtime every night. Do not stand or sit up quickly, especially if you are an older patient. This reduces the risk of dizzy or fainting spells. Avoid alcoholic drinks.  What side effects may I notice from receiving this medicine? Side effects that you should report to your doctor or health care professional as soon as possible: -chest pain, palpitations -depression or severe changes in mood -increased blood pressure -irritability -nervousness or restlessness -severe dizziness -shortness of breath -problems urinating -unusual swelling of the legs -vomiting  Side effects that usually do not require medical attention (report to your doctor or health care professional if they continue or are bothersome): -blurred vision or other eye problems -changes in sexual ability or desire -constipation or diarrhea -difficulty sleeping -dry mouth or unpleasant taste -headache -nausea This list may not describe all possible side effects. Call your doctor for medical advice about side effects. You may report side effects to FDA at 1-800-FDA-1088.     Fatty liver or Nonalcoholic fatty liver disease (NASH)  Now the leading cause of liver failure in the [place].  It is normally from such risk factors as obesity, diabetes, insulin resistance, high cholesterol, or metabolic syndrome.  The only definitive therapy is weight loss and exercise.    Suggest walking 20-30 mins daily.  Decreasing carbohydrates, increasing veggies.  Vitamin E 800 IU a day may be beneficial. Liver cancer has been noted in patient with fatty liver without cirrhosis.  Will monitor closely   Fatty Liver Fatty liver is the accumulation of fat in liver cells. It is also called  hepatosteatosis or steatohepatitis. It is normal for your liver to contain some fat. If fat is more than 5 to 10% of your liver's weight, you have fatty liver.  There are often no symptoms (problems) for years while damage is still occurring. People often learn about their fatty liver when they have medical tests for other reasons. Fat can damage your liver for years or even decades without causing problems. When it becomes severe, it can cause fatigue, weight loss, weakness, and confusion. This makes you more likely to develop more serious liver problems. The liver is the largest organ in the body. It does a lot of work and often gives no warning signs when it is sick until late in a disease. The liver has many important jobs including:  Breaking down foods.  Storing vitamins, iron, and other minerals.  Making proteins.  Making bile for food digestion.  Breaking down many products including medications, alcohol and some poisons.  PROGNOSIS  Fatty liver may cause no damage or it can lead to an inflammation of the liver. This is, called steatohepatitis.  Over time the liver may become scarred and hardened. This condition is called cirrhosis. Cirrhosis is serious and may lead to liver failure or cancer. NASH is one of the leading causes of cirrhosis. About 10-20% of Americans have fatty liver and a smaller 2-5% has NASH.  TREATMENT   Weight loss, fat restriction, and exercise in overweight patients produces inconsistent results but is worth trying.  Good control of diabetes may reduce fatty liver.  Eat a balanced, healthy diet.  Increase your physical activity.  There are no medical or surgical treatments for a fatty liver or NASH, but improving your diet and increasing your exercise may help prevent or reverse some of the damage.

## 2019-12-06 ENCOUNTER — Other Ambulatory Visit: Payer: Self-pay | Admitting: Orthopedic Surgery

## 2019-12-07 ENCOUNTER — Other Ambulatory Visit: Payer: Self-pay | Admitting: Orthopedic Surgery

## 2019-12-07 ENCOUNTER — Encounter: Payer: Self-pay | Admitting: Physician Assistant

## 2019-12-07 DIAGNOSIS — G459 Transient cerebral ischemic attack, unspecified: Secondary | ICD-10-CM | POA: Insufficient documentation

## 2019-12-07 DIAGNOSIS — Z8673 Personal history of transient ischemic attack (TIA), and cerebral infarction without residual deficits: Secondary | ICD-10-CM | POA: Insufficient documentation

## 2019-12-13 ENCOUNTER — Other Ambulatory Visit: Payer: Self-pay | Admitting: *Deleted

## 2019-12-13 MED ORDER — ONETOUCH VERIO W/DEVICE KIT: PACK | 0 refills | Status: DC

## 2019-12-13 MED ORDER — ONETOUCH DELICA LANCETS 33G MISC: 12 refills | Status: DC

## 2019-12-13 MED ORDER — ONETOUCH VERIO VI STRP: ORAL_STRIP | 12 refills | Status: DC

## 2019-12-18 ENCOUNTER — Encounter: Payer: Self-pay | Admitting: Internal Medicine

## 2019-12-24 ENCOUNTER — Inpatient Hospital Stay: Payer: 59 | Admitting: Adult Health

## 2019-12-27 ENCOUNTER — Other Ambulatory Visit: Payer: Self-pay | Admitting: Physician Assistant

## 2019-12-27 ENCOUNTER — Other Ambulatory Visit: Payer: Self-pay

## 2019-12-27 ENCOUNTER — Encounter: Payer: Self-pay | Admitting: *Deleted

## 2019-12-27 DIAGNOSIS — E0822 Diabetes mellitus due to underlying condition with diabetic chronic kidney disease: Secondary | ICD-10-CM

## 2019-12-27 DIAGNOSIS — G8929 Other chronic pain: Secondary | ICD-10-CM

## 2019-12-27 DIAGNOSIS — E1169 Type 2 diabetes mellitus with other specified complication: Secondary | ICD-10-CM

## 2019-12-27 DIAGNOSIS — N182 Chronic kidney disease, stage 2 (mild): Secondary | ICD-10-CM

## 2019-12-27 DIAGNOSIS — Z794 Long term (current) use of insulin: Secondary | ICD-10-CM

## 2019-12-27 MED ORDER — CLOPIDOGREL BISULFATE 75 MG PO TABS: 75.0000 mg | ORAL_TABLET | Freq: Every day | ORAL | 1 refills | Status: DC

## 2019-12-27 MED ORDER — MELOXICAM 15 MG PO TABS: 15.0000 mg | ORAL_TABLET | ORAL | 1 refills | Status: DC | PRN

## 2020-01-02 DIAGNOSIS — D6859 Other primary thrombophilia: Secondary | ICD-10-CM | POA: Insufficient documentation

## 2020-01-02 NOTE — Progress Notes (Signed)
COMPLETE PHYSICAL Assessment and Plan: Encounter for general adult medical examination with abnormal findings 1 year Get colonoscopy- he is overdue Discussed injections Will get TDAP when able, given info on shingrix.   Acquired Thrombophilia ([place]) -     CBC with Differential/Platelet MONITOR- patient on plavix  Aortic atherosclerosis (HCC) Control blood pressure, cholesterol, glucose, increase exercise.   Morbid obesity (HCC) -     topiramate (TOPAMAX) 50 MG tablet; Take 1 tablet (50 mg total) by mouth 2 (two) times daily. ADD TOPAMAX TO PHENTERMINE CONTINUE OZEMPIC CONTINUE TO CUT BACK ON PORTION, EATING OUT, AND ADD IN EXERCISE.  DISCUSSED SURGERY - follow up 3 months for progress monitoring - increase veggies, decrease carbs - long discussion about weight loss, diet, and exercise  CKD stage 2 due to type 2 diabetes mellitus (HCC) -     COMPLETE METABOLIC PANEL WITH GFR -     Hemoglobin A1c -     EKG 12-Lead  Type 2 diabetes mellitus with hyperlipidemia (HCC) -     Lipid panel -     Hemoglobin A1c -     EKG 12-Lead Discussed general issues about diabetes pathophysiology and management., Educational material distributed., Suggested low cholesterol diet., Encouraged aerobic exercise., Discussed foot care., Reminded to get yearly retinal exam. CONTINUE OZEMPIC, CONTINUE WEIGHT LOSS  Essential hypertension -     COMPLETE METABOLIC PANEL WITH GFR -     TSH -     Cancel: Urinalysis, Routine w reflex microscopic -     Cancel: Microalbumin / creatinine urine ratio PATIENT COULD NOT GIVE URINE, GET NEXT OV -     EKG 12-Lead - continue medications, DASH diet, exercise and monitor at home. Call if greater than 130/80.   Vitamin D deficiency -     VITAMIN D 25 Hydroxy (Vit-D Deficiency, Fractures)  Medication management -     Magnesium  Microcytic anemia -     Iron,Total/Total Iron Binding Cap -     Vitamin B12  Hepatomegaly -     COMPLETE METABOLIC PANEL WITH  GFR Continue weight loss May want to monitor yearly or get fibroscan  BMI 45.0-49.9, adult ([place]) - follow up 3 months for progress monitoring - increase veggies, decrease carbs - long discussion about weight loss, diet, and exercise  Hepatic steatosis -     COMPLETE METABOLIC PANEL WITH GFR Check labs, avoid tylenol, alcohol, weight loss advised.   Gastroesophageal reflux disease with esophagitis without hemorrhage Continue PPI/H2 blocker, diet discussed  OSA (obstructive sleep apnea) Long discussion about how it is harder to manage his chronic co morbidities if he is not treating OSA Continue weight loss  Venous insufficiency Has improved with weight loss, elevation.  Suggest getting on CPAP/Bipap  TIA (transient ischemic attack) Continue plavix Control blood pressure, cholesterol, glucose, increase exercise.   Screening PSA (prostate specific antigen) -     PSA  Discussed med's effects and SE's. Screening labs and tests as requested with regular follow-up as recommended. Over 40 minutes of exam, counseling, chart review and critical decision making was performed  HPI Patient presents for CPE and follow up for HTN, chol, DM, morbid obesity, OSA, venous insuff.   He moved her from [place] in 2019 to be closer to his daughter, Mariam Dollar.  He had a work up for a possible TIA in 2019, normal echo, normal MRI brain, he is on plavix due to this.  He has chronic edema due to his morbid obesity.   He had  right shoulder replacement 2019 in [place], following with ortho here, has a reverse shoulder surgery with Dr. Tamera Punt planned next month (01/24/2020). He had an abscess at the cleft of his buttocks with sepsis, treated with ABX.  Ct showed hepatomegaly and fatty liver.  He did have a positive fecal occult blood and is due for a colonoscopy this year.   BMI is Body mass index is 46.21 kg/m., he is working on diet and exercise.  He has OSA and has tried CPAP and BiPAP, has not  been able to have one due to feeling of suffocation.  Wt Readings from Last 3 Encounters:  01/03/20 (!) 336 lb (152.4 kg)  12/05/19 (!) 344 lb (156 kg)  11/28/19 (!) 342 lb (155.1 kg)   His blood pressure has been controlled at home, today their BP is BP: 132/66 He does not workout. He denies chest pain, shortness of breath, dizziness.   He is on cholesterol medication and denies myalgias. He has been working on diet and exercise for diabetes, With CKD stage 2 With hyperlipidemia on lipitor 54m Sugars have been under 111, highest is 116.  He is on metformin 10089mBID ozempic 1 mg    he is not on bASA but he is on plavix Carotid doppler 04/2019 less than 50% Retinal hemorrhages due to DM, due annual exam due 03/2020 Lab Results  Component Value Date   HGBA1C 6.7 (H) 01/03/2020   Lab Results  Component Value Date   CHOL 121 01/03/2020   HDL 30 (L) 01/03/2020   LDLCALC 59 01/03/2020   TRIG 300 (H) 01/03/2020   CHOLHDL 4.0 01/03/2020   Lab Results  Component Value Date   GFRNONAA 67 01/03/2020     Current Medications:   Current Outpatient Medications (Endocrine & Metabolic):  .  metFORMIN (GLUCOPHAGE) 1000 MG tablet, TAKE 1 TABLET BY MOUTH 2 TIMES DAILY .  Semaglutide, 1 MG/DOSE, (OZEMPIC, 1 MG/DOSE,) 2 MG/1.5ML SOPN, Inject 1 mg into the skin once a week. Inject into the skin.  Current Outpatient Medications (Cardiovascular):  .  atorvastatin (LIPITOR) 80 MG tablet, Take 1 tablet Daily for Cholesterol (Patient taking differently: Take 80 mg by mouth daily at 6 PM. ) .  carvedilol (COREG) 25 MG tablet, Take 25 mg by mouth 2 (two) times daily with a meal. .  ezetimibe (ZETIA) 10 MG tablet, Take 1 tablet (10 mg total) by mouth daily. .  furosemide (LASIX) 40 MG tablet, Take 1 tablet 2 x /day for BP & Fluid Retention / Ankle Swelling (Patient taking differently: Take 20 mg by mouth daily. ) .  valsartan (DIOVAN) 160 MG tablet, Take 1 tablet 2 x  /day for BP (Patient taking  differently: Take 160 mg by mouth 2 (two) times daily. )   Current Outpatient Medications (Analgesics):  .  meloxicam (MOBIC) 15 MG tablet, Take 1 tablet (15 mg total) by mouth as needed for pain.  Current Outpatient Medications (Hematological):  .  clopidogrel (PLAVIX) 75 MG tablet, Take 1 tablet (75 mg total) by mouth daily.  Current Outpatient Medications (Other):  .  Blood Glucose Monitoring Suppl (ONETOUCH VERIO) w/Device KIT, Check blood sugar 1 time daily-DX-E11.22. . Marland KitchenCholecalciferol 125 MCG (5000 UT) TABS, Take 5,000 Units by mouth. .  Coenzyme Q10 200 MG capsule, Take 200 mg by mouth daily before breakfast. .  glucose blood (ONETOUCH VERIO) test strip, Check blood sugar 1 time a day-E11.22. . Marland Kitchenomeprazole (PRILOSEC) 40 MG capsule, Take 1 capsule Daily for  Heart burn & Indigestion .  OneTouch Delica Lancets 50K MISC, Check blood sugar 1 time a day-E11.22. Marland Kitchen  phentermine (ADIPEX-P) 37.5 MG tablet, Take 1 tablet (37.5 mg total) by mouth daily before breakfast. .  triamcinolone ointment (KENALOG) 0.1 %, Apply 1 application topically 2 (two) times daily. Marland Kitchen  topiramate (TOPAMAX) 50 MG tablet, Take 1 tablet (50 mg total) by mouth 2 (two) times daily.  Allergies:  Allergies  Allergen Reactions  . Sulfamethoxazole-Trimethoprim Rash   Health Maintenance:   Screening Tests  There is no immunization history on file for this patient.  Preventative care: Last colonoscopy: 2010, he is due will get after his surgery.   Prior vaccinations: TD or Tdap: need to get for grandbabies Influenza: declines Pneumococcal: DUE but will wait until 38, about a year Prevnar13: due 72 Shingrix: discussed- given info Covid vaccines completed  Names of Other Physician/Practitioners you currently use: 1. [facility] here for primary care Eye doctor 03/2019 Patient Care Team: Unk Pinto, MD as PCP - General (Internal Medicine)  Medical History:  has  Morbid obesity [facility]); Type 2 diabetes mellitus with hyperlipidemia ([place]); Vitamin D deficiency; GERD (gastroesophageal reflux disease); OSA (obstructive sleep apnea); Hypertension; CKD stage 2 due to type 2 diabetes mellitus ([place]); Medication management; Venous insufficiency; Fecal occult blood test positive; Microcytic anemia; Aortic atherosclerosis ([place]); Hepatic steatosis; Hepatomegaly; BMI 45.0-49.9, adult ([place]); TIA (transient ischemic attack); and Thrombophilia ([place]) on their problem list. Surgical History:  He  has a past surgical history that includes Replacement total knee (Left); Total shoulder replacement (Right); Tonsillectomy; and Colonoscopy (2010). Family History:  His family history includes COPD in his mother and sister; Cancer in his mother; Cancer (age of onset: 84) in his father; Crohn's disease in his son; Diabetes in his father; Hyperlipidemia in his father and mother; Hypertension in his father and mother; Osteoporosis in his mother; Prostate cancer in his father.   Social History:   reports that he has never smoked. He has never used smokeless tobacco. He reports that he does not use drugs. No history on file for alcohol. Review of Systems:  Review of Systems  Constitutional: Negative.   HENT: Negative.   Eyes: Negative.   Respiratory: Positive for shortness of breath (with walking due to body habitus). Negative for cough, hemoptysis, sputum production and wheezing.   Cardiovascular: Positive for leg swelling (improved). Negative for chest pain, palpitations, orthopnea and PND.  Gastrointestinal: Negative for abdominal pain, blood in stool, constipation, diarrhea, heartburn (has hiatal hernia), melena, nausea and vomiting.  Genitourinary: Negative.   Musculoskeletal: Positive for back pain, joint pain and myalgias. Negative for falls and neck pain.  Skin: Positive for rash.  Neurological: Negative.   Endo/Heme/Allergies: Negative.   Psychiatric/Behavioral: Negative.      Physical Exam: Estimated body mass index is 46.21 kg/m as calculated from the following:   Height as of this encounter: 5' 11.5" (1.816 m).   Weight as of this encounter: 336 lb (152.4 kg). BP 132/66   Pulse 65   Temp (!) 97.5 F (36.4 C)   Ht 5' 11.5" (1.816 m)   Wt (!) 336 lb (152.4 kg)   SpO2 95%   BMI 46.21 kg/m  General Appearance: Obese WM, in no apparent distress.  Eyes: PERRLA, EOMs, conjunctiva no swelling or erythema, normal fundi and vessels.  Sinuses: No Frontal/maxillary tenderness  ENT/Mouth: Ext aud canals clear, normal light reflex with TMs without erythema, bulging. Mouth and nose not examined- patient  wearing a facemask. Hearing normal.  Neck: Supple, thyroid normal. No bruits  Respiratory: Respiratory effort normal, BS decreased due to body habitus but equal bilaterally without rales, rhonchi, wheezing or stridor.  Cardio: RRR without murmurs, rubs or gallops. Brisk DP 2+ but 1+  TP, 1+ edema with improved venous stasis bilateral legs, no warmth, no erythema, no hard cord.  Chest: symmetric, with normal excursions and percussion.  Abdomen: Soft, obese, nontender, no guarding, rebound, hernias, masses, or organomegaly.  Lymphatics: Non tender without lymphadenopathy.  Genitourinary: defer Musculoskeletal: Full ROM all peripheral extremities,antalgic gait, decreased movement right shoulder due to pain.  Skin: Warm, dry. Neuro: Cranial nerves intact, reflexes equal bilaterally. Normal muscle tone, no cerebellar symptoms. Sensation intact bilateral feet without ulcers. Marland Kitchen  Psych: Awake and oriented X 3, normal affect, Insight and Judgment appropriate.   EKG: IRBBB, LAFB changes, PRWP, no ST changes.   Vicie Mutters 8:40 AM [facility]

## 2020-01-03 ENCOUNTER — Other Ambulatory Visit: Payer: Self-pay

## 2020-01-03 ENCOUNTER — Ambulatory Visit: Payer: 59 | Admitting: Physician Assistant

## 2020-01-03 ENCOUNTER — Encounter: Payer: Self-pay | Admitting: Physician Assistant

## 2020-01-03 VITALS — BP 132/66 | HR 65 | Temp 97.5°F | Ht 71.5 in | Wt 336.0 lb

## 2020-01-03 DIAGNOSIS — E1122 Type 2 diabetes mellitus with diabetic chronic kidney disease: Secondary | ICD-10-CM

## 2020-01-03 DIAGNOSIS — Z13220 Encounter for screening for lipoid disorders: Secondary | ICD-10-CM

## 2020-01-03 DIAGNOSIS — E559 Vitamin D deficiency, unspecified: Secondary | ICD-10-CM | POA: Diagnosis not present

## 2020-01-03 DIAGNOSIS — R160 Hepatomegaly, not elsewhere classified: Secondary | ICD-10-CM

## 2020-01-03 DIAGNOSIS — Z125 Encounter for screening for malignant neoplasm of prostate: Secondary | ICD-10-CM

## 2020-01-03 DIAGNOSIS — Z0000 Encounter for general adult medical examination without abnormal findings: Secondary | ICD-10-CM | POA: Diagnosis not present

## 2020-01-03 DIAGNOSIS — Z79899 Other long term (current) drug therapy: Secondary | ICD-10-CM | POA: Diagnosis not present

## 2020-01-03 DIAGNOSIS — G459 Transient cerebral ischemic attack, unspecified: Secondary | ICD-10-CM

## 2020-01-03 DIAGNOSIS — G4733 Obstructive sleep apnea (adult) (pediatric): Secondary | ICD-10-CM

## 2020-01-03 DIAGNOSIS — E785 Hyperlipidemia, unspecified: Secondary | ICD-10-CM

## 2020-01-03 DIAGNOSIS — K760 Fatty (change of) liver, not elsewhere classified: Secondary | ICD-10-CM

## 2020-01-03 DIAGNOSIS — I700 Atherosclerosis of aorta: Secondary | ICD-10-CM

## 2020-01-03 DIAGNOSIS — E1169 Type 2 diabetes mellitus with other specified complication: Secondary | ICD-10-CM

## 2020-01-03 DIAGNOSIS — I10 Essential (primary) hypertension: Secondary | ICD-10-CM | POA: Diagnosis not present

## 2020-01-03 DIAGNOSIS — Z131 Encounter for screening for diabetes mellitus: Secondary | ICD-10-CM | POA: Diagnosis not present

## 2020-01-03 DIAGNOSIS — Z6841 Body Mass Index (BMI) 40.0 and over, adult: Secondary | ICD-10-CM

## 2020-01-03 DIAGNOSIS — I872 Venous insufficiency (chronic) (peripheral): Secondary | ICD-10-CM

## 2020-01-03 DIAGNOSIS — K2100 Gastro-esophageal reflux disease with esophagitis, without bleeding: Secondary | ICD-10-CM

## 2020-01-03 DIAGNOSIS — Z136 Encounter for screening for cardiovascular disorders: Secondary | ICD-10-CM | POA: Diagnosis not present

## 2020-01-03 DIAGNOSIS — N182 Chronic kidney disease, stage 2 (mild): Secondary | ICD-10-CM

## 2020-01-03 DIAGNOSIS — N401 Enlarged prostate with lower urinary tract symptoms: Secondary | ICD-10-CM | POA: Diagnosis not present

## 2020-01-03 DIAGNOSIS — R350 Frequency of micturition: Secondary | ICD-10-CM | POA: Diagnosis not present

## 2020-01-03 DIAGNOSIS — D509 Iron deficiency anemia, unspecified: Secondary | ICD-10-CM

## 2020-01-03 DIAGNOSIS — Z130 Encounter for screening for diseases of the blood and blood-forming organs and certain disorders involving the immune mechanism: Secondary | ICD-10-CM | POA: Diagnosis not present

## 2020-01-03 DIAGNOSIS — Z0001 Encounter for general adult medical examination with abnormal findings: Secondary | ICD-10-CM

## 2020-01-03 DIAGNOSIS — D6859 Other primary thrombophilia: Secondary | ICD-10-CM

## 2020-01-03 MED ORDER — TOPIRAMATE 50 MG PO TABS: 50.0000 mg | ORAL_TABLET | Freq: Two times a day (BID) | ORAL | 2 refills | Status: DC

## 2020-01-03 NOTE — Patient Instructions (Addendum)
Please call to get after your surgery.   Colon cancer is 3rd most diagnosed cancer and 2nd leading cause of death in both men and women 64 years of age and older despite being one of the most preventable and treatable cancers if found early.  4 of out 5 people diagnosed with colon cancer have NO prior family history.  When caught EARLY 90% of colon cancer is curable.   Need to get TDAP next OV or in a month To protect your grand babies     Phentermine   It causes dry mouth and constipation in almost every patient, so try to get 80-100 oz of water a day and increase fiber such as veggies. You can add on a stool softener if you would like.   It can give you energy however it can also cause some people to be shaky, anxious or have palpitations. Stop this medication if that happens and contact the office.   If this medication does not work for you there are several medications that we can try to help rewire your brain in addition to making healthier habits.   Can add on topamax at night for weight loss   TOPIRAMATE Sometimes we will prescribe topiramate AND phentermine together to make Qsymia- separating the medications makes it cheaper.  Or topiramate can be used by itself for weight loss at night.   How to start it start on 1/2 pill for 3-5 nights, can increase to a whole pill for 1-2 weeks.  This medication is good for weight loss, headaches, pain This medication can cause numbness, tingling and can cause brain fog- stop if you get these Check with your eye doctor if you have a history of glaucoma and stop if you severe vision changes or blurry vision.  There is a long acting medication that we can switch to if you have numbness,tingling or brain fog.   Topiramate has been associated with an increased risk of birth defects- and should NOT be taken if pregnant or becoming pregnant.    Ask insurance and pharmacy about shingrix - it is a 2 part shot that we will not be getting in  the office.   Suggest getting AFTER covid vaccines, have to wait at least a month This shot can make you feel bad due to such good immune response it can trigger some inflammation so take tylenol or aleve day of or day after and plan on resting.   Can go to [employer] for more information  Shingrix Vaccination  Two vaccines are licensed and recommended to prevent shingles in the [place] Zoster vaccine live (ZVL, Zostavax) has been in use since 2006. Recombinant zoster vaccine (RZV, Shingrix), has been in use since 2017 and is recommended by ACIP as the preferred shingles vaccine.  What Everyone Should Know about Shingles Vaccine (Shingrix) One of the Recommended Vaccines by Disease Shingles vaccination is the only way to protect against shingles and postherpetic neuralgia (PHN), the most common complication from shingles. CDC recommends that healthy adults 50 years and older get two doses of the shingles vaccine called Shingrix (recombinant zoster vaccine), separated by 2 to 6 months, to prevent shingles and the complications from the disease. Your doctor or pharmacist can give you Shingrix as a shot in your upper arm. Shingrix provides strong protection against shingles and PHN. Two doses of Shingrix is more than 90% effective at preventing shingles and PHN. Protection stays above 85% for at least the first four years after you get  vaccinated. Shingrix is the preferred vaccine, over Zostavax (zoster vaccine live), a shingles vaccine in use since 2006. Zostavax may still be used to prevent shingles in healthy adults 60 years and older. For example, you could use Zostavax if a person is allergic to Shingrix, prefers Zostavax, or requests immediate vaccination and Shingrix is unavailable. Who Should Get Shingrix? Healthy adults 50 years and older should get two doses of Shingrix, separated by 2 to 6 months. You should get Shingrix even if in the  past you . had shingles  . received Zostavax  . are not sure if you had chickenpox There is no maximum age for getting Shingrix. If you had shingles in the past, you can get Shingrix to help prevent future occurrences of the disease. There is no specific length of time that you need to wait after having shingles before you can receive Shingrix, but generally you should make sure the shingles rash has gone away before getting vaccinated. You can get Shingrix whether or not you remember having had chickenpox in the past. Studies show that more than 99% of Americans 40 years and older have had chickenpox, even if they don't remember having the disease. Chickenpox and shingles are related because they are caused by the same virus (varicella zoster virus). After a person recovers from chickenpox, the virus stays dormant (inactive) in the body. It can reactivate years later and cause shingles. If you had Zostavax in the recent past, you should wait at least eight weeks before getting Shingrix. Talk to your healthcare provider to determine the best time to get Shingrix. Shingrix is available in [employer] and pharmacies. To find doctor's offices or pharmacies near you that offer the vaccine, visit HealthMap Vaccine FinderExternal. If you have questions about Shingrix, talk with your healthcare provider. Vaccine for Those 31 Years and Older  Shingrix reduces the risk of shingles and PHN by more than 90% in people 56 and older. CDC recommends the vaccine for healthy adults 69 and older.  Who Should Not Get Shingrix? You should not get Shingrix if you: . have ever had a severe allergic reaction to any component of the vaccine or after a dose of Shingrix  . tested negative for immunity to varicella zoster virus. If you test negative, you should get chickenpox vaccine.  . currently have shingles  . currently are pregnant or breastfeeding. Women who are pregnant or breastfeeding should wait to get  Shingrix.  Marland Kitchen receive specific antiviral drugs (acyclovir, famciclovir, or valacyclovir) 24 hours before vaccination (avoid use of these antiviral drugs for 14 days after vaccination)- zoster vaccine live only If you have a minor acute (starts suddenly) illness, such as a cold, you may get Shingrix. But if you have a moderate or severe acute illness, you should usually wait until you recover before getting the vaccine. This includes anyone with a temperature of 101.89F or higher. The side effects of the Shingrix are temporary, and usually last 2 to 3 days. While you may experience pain for a few days after getting Shingrix, the pain will be less severe than having shingles and the complications from the disease. How Well Does Shingrix Work? Two doses of Shingrix provides strong protection against shingles and postherpetic neuralgia (PHN), the most common complication of shingles. . In adults 100 to 64 years old who got two doses, Shingrix was 97% effective in preventing shingles; among adults 70 years and older, Shingrix was 91% effective.  . In adults 50 to 64 years  old who got two doses, Shingrix was 91% effective in preventing PHN; among adults 70 years and older, Shingrix was 89% effective. Shingrix protection remained high (more than 85%) in people 70 years and older throughout the four years following vaccination. Since your risk of shingles and PHN increases as you get older, it is important to have strong protection against shingles in your older years. Top of Page  What Are the Possible Side Effects of Shingrix? Studies show that Shingrix is safe. The vaccine helps your body create a strong defense against shingles. As a result, you are likely to have temporary side effects from getting the shots. The side effects may affect your ability to do normal daily activities for 2 to 3 days. Most people got a sore arm with mild or moderate pain after getting Shingrix, and some also had redness and  swelling where they got the shot. Some people felt tired, had muscle pain, a headache, shivering, fever, stomach pain, or nausea. About 1 out of 6 people who got Shingrix experienced side effects that prevented them from doing regular activities. Symptoms went away on their own in about 2 to 3 days. Side effects were more common in younger people. You might have a reaction to the first or second dose of Shingrix, or both doses. If you experience side effects, you may choose to take over-the-counter pain medicine such as ibuprofen or acetaminophen. If you experience side effects from Shingrix, you should report them to the Vaccine Adverse Event Reporting System (VAERS). Your doctor might file this report, or you can do it yourself through the VAERS websiteExternal, or by calling [phone]. If you have any questions about side effects from Shingrix, talk with your doctor. The shingles vaccine does not contain thimerosal (a preservative containing mercury). Top of Page  When Should I See a Doctor Because of the Side Effects I Experience From Shingrix? In clinical trials, Shingrix was not associated with serious adverse events. In fact, serious side effects from vaccines are extremely rare. For example, for every 1 million doses of a vaccine given, only one or two people may have a severe allergic reaction. Signs of an allergic reaction happen within minutes or hours after vaccination and include hives, swelling of the face and throat, difficulty breathing, a fast heartbeat, dizziness, or weakness. If you experience these or any other life-threatening symptoms, see a doctor right away. Shingrix causes a strong response in your immune system, so it may produce short-term side effects more intense than you are used to from other vaccines. These side effects can be uncomfortable, but they are expected and usually go away on their own in 2 or 3 days. Top of Page  How Can I Pay For Shingrix? There are several  ways shingles vaccine may be paid for: Medicare . Medicare Part D plans cover the shingles vaccine, but there may be a cost to you depending on your plan. There may be a copay for the vaccine, or you may need to pay in full then get reimbursed for a certain amount.  . Medicare Part B does not cover the shingles vaccine. Medicaid . Medicaid may or may not cover the vaccine. Contact your insurer to find out. Private health insurance . Many private health insurance plans will cover the vaccine, but there may be a cost to you depending on your plan. Contact your insurer to find out. Vaccine assistance programs . Some pharmaceutical companies provide vaccines to eligible adults who cannot afford them. You  may want to check with the vaccine manufacturer, [employer], about Shingrix. If you do not currently have health insurance, learn more about affordable health coverage optionsExternal. To find doctor's offices or pharmacies near you that offer the vaccine, visit HealthMap Vaccine FinderExternal.      When it comes to diets, agreement about the perfect plan isn't easy to find, even among the experts. Experts at the [employer] developed an idea known as the Healthy Eating Plate. Just imagine a plate divided into logical, healthy portions.  The emphasis is on diet quality:  Load up on vegetables and fruits - one-half of your plate: Aim for color and variety, and remember that potatoes don't count.  Go for whole grains - one-quarter of your plate: Whole wheat, barley, wheat berries, quinoa, oats, brown rice, and foods made with them. If you want pasta, go with whole wheat pasta.  Protein power - one-quarter of your plate: Fish, chicken, beans, and nuts are all healthy, versatile protein sources. Limit red meat.  The diet, however, does go beyond the plate, offering a few other suggestions.  Use healthy plant oils, such as olive, canola, soy, corn, sunflower and peanut.  Check the labels, and avoid partially hydrogenated oil, which have unhealthy trans fats.  If you're thirsty, drink water. Coffee and tea are good in moderation, but skip sugary drinks and limit milk and dairy products to one or two daily servings.  The type of carbohydrate in the diet is more important than the amount. Some sources of carbohydrates, such as vegetables, fruits, whole grains, and beans--are healthier than others.  Finally, stay active.

## 2020-01-04 ENCOUNTER — Ambulatory Visit (INDEPENDENT_AMBULATORY_CARE_PROVIDER_SITE_OTHER): Payer: 59 | Admitting: Internal Medicine

## 2020-01-04 ENCOUNTER — Telehealth: Payer: Self-pay | Admitting: *Deleted

## 2020-01-04 ENCOUNTER — Encounter: Payer: Self-pay | Admitting: Internal Medicine

## 2020-01-04 ENCOUNTER — Encounter: Payer: Self-pay | Admitting: Physician Assistant

## 2020-01-04 VITALS — BP 94/62 | HR 60 | Temp 96.8°F | Ht 70.0 in | Wt 334.2 lb

## 2020-01-04 DIAGNOSIS — R195 Other fecal abnormalities: Secondary | ICD-10-CM

## 2020-01-04 DIAGNOSIS — D509 Iron deficiency anemia, unspecified: Secondary | ICD-10-CM

## 2020-01-04 DIAGNOSIS — Z7902 Long term (current) use of antithrombotics/antiplatelets: Secondary | ICD-10-CM | POA: Diagnosis not present

## 2020-01-04 DIAGNOSIS — K219 Gastro-esophageal reflux disease without esophagitis: Secondary | ICD-10-CM | POA: Diagnosis not present

## 2020-01-04 LAB — CBC WITH DIFFERENTIAL/PLATELET
Absolute Monocytes: 711 cells/uL (ref 200–950)
Basophils Absolute: 72 cells/uL (ref 0–200)
Basophils Relative: 0.7 %
Eosinophils Absolute: 237 cells/uL (ref 15–500)
Eosinophils Relative: 2.3 %
HCT: 39.4 % (ref 38.5–50.0)
Hemoglobin: 12.6 g/dL — ABNORMAL LOW (ref 13.2–17.1)
Lymphs Abs: 1957 cells/uL (ref 850–3900)
MCH: 24.6 pg — ABNORMAL LOW (ref 27.0–33.0)
MCHC: 32 g/dL (ref 32.0–36.0)
MCV: 77 fL — ABNORMAL LOW (ref 80.0–100.0)
MPV: 9.3 fL (ref 7.5–12.5)
Monocytes Relative: 6.9 %
Neutro Abs: 7323 cells/uL (ref 1500–7800)
Neutrophils Relative %: 71.1 %
Platelets: 348 10*3/uL (ref 140–400)
RBC: 5.12 10*6/uL (ref 4.20–5.80)
RDW: 15.6 % — ABNORMAL HIGH (ref 11.0–15.0)
Total Lymphocyte: 19 %
WBC: 10.3 10*3/uL (ref 3.8–10.8)

## 2020-01-04 LAB — HEMOGLOBIN A1C
Hgb A1c MFr Bld: 6.7 % of total Hgb — ABNORMAL HIGH (ref <5.7)
Mean Plasma Glucose: 146 (calc)
eAG (mmol/L): 8.1 (calc)

## 2020-01-04 LAB — COMPLETE METABOLIC PANEL WITH GFR
AG Ratio: 1.4 (calc) (ref 1.0–2.5)
ALT: 21 U/L (ref 9–46)
AST: 18 U/L (ref 10–35)
Albumin: 4.2 g/dL (ref 3.6–5.1)
Alkaline phosphatase (APISO): 94 U/L (ref 35–144)
BUN/Creatinine Ratio: 26 (calc) — ABNORMAL HIGH (ref 6–22)
BUN: 30 mg/dL — ABNORMAL HIGH (ref 7–25)
CO2: 23 mmol/L (ref 20–32)
Calcium: 10.1 mg/dL (ref 8.6–10.3)
Chloride: 104 mmol/L (ref 98–110)
Creat: 1.16 mg/dL (ref 0.70–1.25)
GFR, Est African American: 77 mL/min/{1.73_m2} (ref >=60)
GFR, Est Non African American: 67 mL/min/{1.73_m2} (ref >=60)
Globulin: 3 g/dL (calc) (ref 1.9–3.7)
Glucose, Bld: 106 mg/dL — ABNORMAL HIGH (ref 65–99)
Potassium: 5 mmol/L (ref 3.5–5.3)
Sodium: 139 mmol/L (ref 135–146)
Total Bilirubin: 0.3 mg/dL (ref 0.2–1.2)
Total Protein: 7.2 g/dL (ref 6.1–8.1)

## 2020-01-04 LAB — LIPID PANEL
Cholesterol: 121 mg/dL (ref <200)
HDL: 30 mg/dL — ABNORMAL LOW (ref >=40)
LDL Cholesterol (Calc): 59 mg/dL (calc)
Non-HDL Cholesterol (Calc): 91 mg/dL (calc) (ref <130)
Total CHOL/HDL Ratio: 4 (calc) (ref <5.0)
Triglycerides: 300 mg/dL — ABNORMAL HIGH (ref <150)

## 2020-01-04 LAB — IRON, TOTAL/TOTAL IRON BINDING CAP
%SAT: 12 % (calc) — ABNORMAL LOW (ref 20–48)
Iron: 46 ug/dL — ABNORMAL LOW (ref 50–180)
TIBC: 390 mcg/dL (calc) (ref 250–425)

## 2020-01-04 LAB — VITAMIN B12: Vitamin B-12: 380 pg/mL (ref 200–1100)

## 2020-01-04 LAB — MAGNESIUM: Magnesium: 1.8 mg/dL (ref 1.5–2.5)

## 2020-01-04 LAB — PSA: PSA: 0.4 ng/mL (ref <=4.0)

## 2020-01-04 LAB — VITAMIN D 25 HYDROXY (VIT D DEFICIENCY, FRACTURES): Vit D, 25-Hydroxy: 32 ng/mL (ref 30–100)

## 2020-01-04 LAB — TSH: TSH: 1.04 mIU/L (ref 0.40–4.50)

## 2020-01-04 MED ORDER — SUTAB 1479-225-188 MG PO TABS: 1.0000 | ORAL_TABLET | ORAL | 0 refills | Status: DC

## 2020-01-04 NOTE — Patient Instructions (Addendum)
You have been scheduled for an endoscopy and colonoscopy. Please follow the written instructions given to you at your visit today. Please pick up your prep supplies at the pharmacy within the next 1-3 days. If you use inhalers (even only as needed), please bring them with you on the day of your procedure. Your physician has requested that you go to www.startemmi.com and enter the access code given to you at your visit today. This web site gives a general overview about your procedure. However, you should still follow specific instructions given to you by our office regarding your preparation for the procedure. __________________________________________________________ You will be contacted by our office prior to your procedure for directions on holding your Plavix.  If you do not hear from our office 1 week prior to your scheduled procedure, please call [phone] to discuss.    DO NOT TAKE GLUCOPHAGE THE MORNING OF YOUR COLONOSCOPY PROCEDURE.                _ x_ OTHER NON-INSULIN INJECTABLE MEDICATIONS         (ozympic)  Hold the am of the procedure  HOLD PHENTERMINE 10 DAYS PRIOR TO YOUR COLONOSCOPY ________________________________________________________________  If you are age 2 or older, your body mass index should be between 23-30. Your Body mass index is 47.96 kg/m. If this is out of the aforementioned range listed, please consider follow up with your Primary Care Provider.  If you are age 49 or younger, your body mass index should be between 19-25. Your Body mass index is 47.96 kg/m. If this is out of the aformentioned range listed, please consider follow up with your Primary Care Provider.  ________________________________________ Due to recent changes in healthcare laws, you may see the results of your imaging and laboratory studies on MyChart before your provider has had a chance to review them.  We understand that in some cases there may be results that are confusing or  concerning to you. Not all laboratory results come back in the same time frame and the provider may be waiting for multiple results in order to interpret others.  Please give [place] 48 hours in order for your provider to thoroughly review all the results before contacting the office for clarification of your results.  _________________________________________

## 2020-01-04 NOTE — Telephone Encounter (Signed)
   Hosie Nordell 04-Sep-1956 [id number]  Dear Vicie Mutters, PA-C:  We have scheduled the above named patient for a(n) endoscopy/colonoscopy procedure. Our records show that (s)he is on anticoagulation therapy.  Please advise as to whether the patient may come off their therapy of Plavix 5 days prior to their procedure which is scheduled for 01/18/20.  Please route your response to Dixon Boos, [place] or fax response to [phone].  Sincerely,    [facility]

## 2020-01-04 NOTE — Progress Notes (Signed)
Patient ID: Dustin Ayala, male   DOB: 11-21-1955, 64 y.o.   MRN: [id number] HPI: Dustin Ayala is a 64 year old male with a history of GERD with hiatal hernia, history of perirectal abscess, fatty liver, hypertension, obesity, type 2 diabetes who is seen in consult at the request of Vicie Mutters, PA-C to evaluate iron deficiency anemia with heme positive stool.  He is here alone today.  He reports that in March he developed a perirectal abscess for which he was treated with antibiotics.  He reports this spontaneously drained itself but prior to this he had severe perirectal pain.  It did resolve after about a week of antibiotics and he is no longer having perirectal pain.  He does occasionally see blood with wiping but no blood in his stool.  He has seen on occasion dark or black-colored stool which is sticky.  This is not happened in the last several days.  No abdominal pain.  He takes omeprazole daily which controls his acid reflux disease.  He denies dysphagia and odynophagia.  His stools at times are loose but he thinks this is related to his medication.  No abdominal pain.  He is facing a left shoulder replacement on 01/24/2020.  He had his right shoulder replaced in 2018 and years before this a knee replacement.  He had a colonoscopy about 10 years ago in [place] but does not remember if he had any polyps.  His father had malignancy but it is unclear whether this is prostate or colon cancer.  Oral iron has been recently recommended after he was found to have a mild microcytic anemia with low iron.  He has not yet started oral iron therapy.  Past Medical History:  Diagnosis Date  . Aortic atherosclerosis ([place])   . GERD (gastroesophageal reflux disease)   . Hepatic steatosis 11/15/2019  . Hepatomegaly 11/15/2019  . Hypertension   . Microcytic anemia   . Morbid obesity ([place])   . Sleep apnea   . TIA (transient ischemic attack) 2017  . Type 2 diabetes mellitus with hyperlipidemia ([place])   .  Vitamin D deficiency     Past Surgical History:  Procedure Laterality Date  . COLONOSCOPY  2010   no family history  . REPLACEMENT TOTAL KNEE Left    BACK IN 2010  . TONSILLECTOMY    . TOTAL SHOULDER REPLACEMENT Right    8.21.2018    Outpatient Medications Prior to Visit  Medication Sig Dispense Refill  . atorvastatin (LIPITOR) 80 MG tablet Take 1 tablet Daily for Cholesterol (Patient taking differently: Take 80 mg by mouth daily. ) 90 tablet 0  . Blood Glucose Monitoring Suppl (ONETOUCH VERIO) w/Device KIT Check blood sugar 1 time daily-DX-E11.22. 1 kit 0  . carvedilol (COREG) 12.5 MG tablet Take 12.5 mg by mouth 2 (two) times daily with a meal.     . Cholecalciferol 125 MCG (5000 UT) TABS Take 5,000 Units by mouth daily.     . clopidogrel (PLAVIX) 75 MG tablet Take 1 tablet (75 mg total) by mouth daily. 90 tablet 1  . Coenzyme Q10 200 MG capsule Take 200 mg by mouth daily before breakfast.    . ezetimibe (ZETIA) 10 MG tablet Take 1 tablet (10 mg total) by mouth daily. 90 tablet 1  . furosemide (LASIX) 40 MG tablet Take 1 tablet 2 x /day for BP & Fluid Retention / Ankle Swelling (Patient taking differently: Take 40 mg by mouth daily. ) 180 tablet 0  . glucose blood (ONETOUCH  VERIO) test strip Check blood sugar 1 time a day-E11.22. 100 each 12  . meloxicam (MOBIC) 15 MG tablet Take 1 tablet (15 mg total) by mouth as needed for pain. (Patient taking differently: Take 15 mg by mouth daily. ) 90 tablet 1  . metFORMIN (GLUCOPHAGE) 1000 MG tablet TAKE 1 TABLET BY MOUTH 2 TIMES DAILY (Patient taking differently: Take 1,000 mg by mouth in the morning and at bedtime. ) 180 tablet 1  . omeprazole (PRILOSEC) 40 MG capsule Take 1 capsule Daily for Heart burn & Indigestion (Patient taking differently: Take 40 mg by mouth daily. ) 90 capsule 0  . OneTouch Delica Lancets 33G MISC Check blood sugar 1 time a day-E11.22. 100 each 12  . phentermine (ADIPEX-P) 37.5 MG tablet Take 1 tablet (37.5 mg total)  by mouth daily before breakfast. 30 tablet 2  . Semaglutide, 1 MG/DOSE, (OZEMPIC, 1 MG/DOSE,) 2 MG/1.5ML SOPN Inject 1 mg into the skin once a week. Inject into the skin. (Patient taking differently: Inject 1 mg into the skin once a week. ) 6 pen 3  . topiramate (TOPAMAX) 50 MG tablet Take 1 tablet (50 mg total) by mouth 2 (two) times daily. 30 tablet 2  . triamcinolone ointment (KENALOG) 0.1 % Apply 1 application topically 2 (two) times daily. (Patient not taking: Reported on 01/04/2020) 80 g 1  . valsartan (DIOVAN) 160 MG tablet Take 1 tablet 2 x  /day for BP (Patient taking differently: Take 160 mg by mouth 2 (two) times daily. ) 180 tablet 0   No facility-administered medications prior to visit.    Allergies  Allergen Reactions  . Sulfamethoxazole-Trimethoprim Rash    Family History  Problem Relation Age of Onset  . COPD Mother   . Cancer Mother        basal  . Hypertension Mother   . Hyperlipidemia Mother   . Osteoporosis Mother   . Prostate cancer Father   . Cancer Father 67       stomach CA  . Diabetes Father   . Hypertension Father   . Hyperlipidemia Father   . COPD Sister   . Crohn's disease Son     Social History   Tobacco Use  . Smoking status: Never Smoker  . Smokeless tobacco: Never Used  Substance Use Topics  . Alcohol use: Not on file  . Drug use: Never    ROS: As per history of present illness, otherwise negative  BP 94/62 (BP Location: Left Arm, Patient Position: Sitting, Cuff Size: Large)   Pulse 60   Temp (!) 96.8 F (36 C)   Ht 5' 10" (1.778 m)   Wt (!) 334 lb 4 oz (151.6 kg)   SpO2 96%   BMI 47.96 kg/m  Gen: awake, alert, NAD HEENT: anicteric CV: RRR, no mrg Pulm: CTA b/l Abd: soft, obese,NT/ND, +BS throughout Ext: no c/c/e Neuro: nonfocal   RELEVANT LABS AND IMAGING: CBC    Component Value Date/Time   WBC 10.3 01/03/2020 1457   RBC 5.12 01/03/2020 1457   HGB 12.6 (L) 01/03/2020 1457   HCT 39.4 01/03/2020 1457   PLT 348  01/03/2020 1457   MCV 77.0 (L) 01/03/2020 1457   MCH 24.6 (L) 01/03/2020 1457   MCHC 32.0 01/03/2020 1457   RDW 15.6 (H) 01/03/2020 1457   LYMPHSABS 1,957 01/03/2020 1457   MONOABS 1.5 (H) 11/15/2019 1155   EOSABS 237 01/03/2020 1457   BASOSABS 72 01/03/2020 1457    CMP       Component Value Date/Time   NA 139 01/03/2020 1457   K 5.0 01/03/2020 1457   CL 104 01/03/2020 1457   CO2 23 01/03/2020 1457   GLUCOSE 106 (H) 01/03/2020 1457   BUN 30 (H) 01/03/2020 1457   CREATININE 1.16 01/03/2020 1457   CALCIUM 10.1 01/03/2020 1457   PROT 7.2 01/03/2020 1457   ALBUMIN 3.2 (L) 11/16/2019 0528   AST 18 01/03/2020 1457   ALT 21 01/03/2020 1457   ALKPHOS 72 11/16/2019 0528   BILITOT 0.3 01/03/2020 1457   GFRNONAA 67 01/03/2020 1457   GFRAA 77 01/03/2020 1457   CT ABDOMEN AND PELVIS WITH CONTRAST   TECHNIQUE: Multidetector CT imaging of the abdomen and pelvis was performed using the standard protocol following bolus administration of intravenous contrast.   CONTRAST:  123m OMNIPAQUE IOHEXOL 300 MG/ML  SOLN   COMPARISON:  None.   FINDINGS: Lower chest: Subsegmental atelectasis or scarring in both lower lobes. No pleural fluid. Calcified lymph node in the inferior left hilum. Calcified granuloma in the left lower lobe. Upper normal heart size. No pericardial effusion.   Hepatobiliary: Enlarged liver spanning 24 cm cranial caudal. Diffuse hepatic steatosis. Few scattered calcified granuloma. No evidence of focal hepatic lesion. Gallbladder physiologically distended, no calcified stone. No biliary dilatation.   Pancreas: No ductal dilatation or inflammation.   Spleen: Normal in size. Scattered calcified granuloma.   Adrenals/Urinary Tract: Normal adrenal glands. No hydronephrosis or perinephric edema. Homogeneous renal enhancement with symmetric excretion on delayed phase imaging. 2.2 cm low-density lesion in the lower right kidney is likely cyst, evaluation partially  obscured by streak artifact related to habitus abutting the CT gantry. Urinary bladder is near completely empty.   Stomach/Bowel: Inflammatory changes about the inferior right gluteal crease with small focus of air, series 3, image 5, no drainable fluid collection. There is no definite connection to the anus or rectum. No rectal wall thickening. Small volume of colonic stool with scattered diverticulosis. No diverticulitis. No small bowel obstruction or inflammation. Diminutive appendix tentatively visualized. No evidence of appendicitis.   Vascular/Lymphatic: Mild aortic atherosclerosis. No aortic aneurysm. No evidence of portal vein thrombosis. No enlarged lymph nodes in the abdomen or pelvis.   Reproductive: Prostate is unremarkable.   Other: Inflammatory changes about the inferior right gluteal crease is small focus of air, best appreciated on series 3, image 5. No focal fluid collection. No intra-abdominopelvic fluid collection. No free air free fluid. Tiny fat containing umbilical hernia. Fat in both inguinal canals.   Musculoskeletal: Multilevel degenerative change in the spine with degenerative disc disease and facet hypertrophy. There are no acute or suspicious osseous abnormalities.   IMPRESSION: 1. Inflammatory changes about the inferior right gluteal crease with small focus of air, no drainable fluid collection. No definite connection to the anus or rectum. 2. No other acute findings in the abdomen/pelvis. Incidental findings of hepatomegaly and hepatic steatosis. Colonic diverticulosis without diverticulitis.   Aortic Atherosclerosis (ICD10-I70.0).     Electronically Signed   By: MKeith RakeM.D.   On: 11/15/2019 15:36    ASSESSMENT/PLAN:  64year old male with a history of GERD with hiatal hernia, history of perirectal abscess, fatty liver, hypertension, obesity, type 2 diabetes who is seen in consult at the request of AVicie Mutters PA-C to evaluate  iron deficiency anemia with heme positive stool.  1.  Iron deficiency anemia with heme positive stool --further investigation certainly warranted.  I recommended upper endoscopy and colonoscopy.  We discussed the risk, benefits and alternatives  and he is agreeable and wishes to proceed.  Can consider video capsule endoscopy if endoscopy and colonoscopy are unrevealing.  He will be starting oral iron and I made him aware that his stools will darken with oral iron and he may develop constipation.  If this occurs he can let me know. --Endoscopy and colonoscopy --To begin oral iron  2.  GERD and history of hiatal hernia --we discussed how hiatal hernia can be associated with Cameron's lesions which we will excluded upper endoscopy.  This could potentially be a source of slow blood loss and iron deficiency.  His reflux symptoms are well controlled with omeprazole he will continue 40 mg daily  3.  Chronic Plavix use --we will need to hold his clopidogrel 5 days prior to his procedures.  We discussed this together and we will seek permission/input from prescribing provider.  Will hold Plavix 5 days prior to endoscopic procedures - will instruct when and how to resume after procedure. Benefits and risks of procedure explained including risks of bleeding, perforation, infection, missed lesions, reactions to medications and possible need for hospitalization and surgery for complications. Additional rare but real risk of stroke or other vascular clotting events off Plavix also explained and need to seek urgent help if any signs of these problems occur. Will communicate by phone or EMR with patient's  prescribing provider to confirm that holding Plavix is reasonable in this case.     EG:[facility], [place], [address] [place],  [place] [id number]

## 2020-01-07 ENCOUNTER — Encounter: Payer: Self-pay | Admitting: Physician Assistant

## 2020-01-07 NOTE — Telephone Encounter (Signed)
Vicie Mutters, PA-C on 01/07/2020  [facility]  [address] [place] [id number] Telephone [phone] Fax [phone]    01/07/20   Re: Dustin Ayala DOB:  1955/10/15 MRN:  [id number]   Address: [address] [place] [address]   Dear Dr. Hilarie Fredrickson,  Dustin Ayala was evaluated on 01/03/20 for a complete physical. He is able to stop his Plavix 5 days prior to his scheduled colonoscopy and endoscopy.    Please call [phone] with any additional questions.  Sincerely,    Vicie Mutters, PA-C

## 2020-01-07 NOTE — Telephone Encounter (Signed)
I have spoken to patient to advise that Vicie Mutters, PA-C has given okay for him to hold Plavix 5 days prior to his upcoming endoscopy/colonoscopy procedure. He verbalizes understanding of this.

## 2020-01-07 NOTE — Progress Notes (Signed)
Patient is able to discontinue his plavix 5 days prior to colonoscopy and endoscopy for the evaluation of microcytic anemia and heme positive stools.

## 2020-01-14 ENCOUNTER — Other Ambulatory Visit: Payer: Self-pay | Admitting: Physician Assistant

## 2020-01-15 NOTE — Patient Instructions (Addendum)
DUE TO COVID-19 ONLY ONE VISITOR IS ALLOWED TO COME WITH YOU AND STAY IN THE WAITING ROOM ONLY DURING PRE OP AND PROCEDURE DAY OF SURGERY. THE 1 VISITOR MAY VISIT WITH YOU AFTER SURGERY IN YOUR PRIVATE ROOM DURING VISITING HOURS ONLY!  YOU NEED TO HAVE A COVID 19 TEST ON 01-21-20 @ 2:40 PM, THIS TEST MUST BE DONE BEFORE SURGERY, COME  [place], [place] , [id number].  ([place]) ONCE YOUR COVID TEST IS COMPLETED, PLEASE BEGIN THE QUARANTINE INSTRUCTIONS AS OUTLINED IN YOUR HANDOUT.                Dustin Ayala  01/15/2020   Your procedure is scheduled on: 01-24-20   Report to [facility] Main  Entrance    Report to Short Stay at  5:30 AM     Call this number if you have problems the morning of surgery [phone]    Remember: AFTER MIDNIGHT THE NIGHT PRIOR TO SURGERY. NOTHING BY MOUTH EXCEPT CLEAR LIQUIDS UNTIL 4:30 AM . PLEASE FINISH G2 DRINK PER SURGEON ORDER  WHICH NEEDS TO BE COMPLETED AT 4:30 AM.   CLEAR LIQUID DIET   Foods Allowed                                                                     Foods Excluded  Coffee and tea, regular and decaf                             liquids that you cannot  Plain Jell-O any favor except red or purple                                           see through such as: Fruit ices (not with fruit pulp)                                     milk, soups, orange juice  Iced Popsicles                                    All solid food Carbonated beverages, regular and diet                                    Cranberry, grape and apple juices Sports drinks like Gatorade Lightly seasoned clear broth or consume(fat free) Sugar, honey syrup   _____________________________________________________________________     Take these medicines the morning of surgery with A SIP OF WATER: Atorvastatin (Lipitor), Carvedilol (Coreg), Eetimibe (Zetia), and Omeprazole (Prilosec)  BRUSH YOUR TEETH MORNING OF SURGERY AND RINSE  YOUR MOUTH OUT, NO CHEWING GUM CANDY OR MINTS.   DO NOT TAKE ANY DIABETIC MEDICATIONS DAY OF YOUR SURGERY  You may not have any metal on your body including hair pins and              piercings     Do not wear jewelry, cologne, lotions, powders or deodorant                Men may shave face and neck.   Do not bring valuables to the hospital. [place].  Contacts, dentures or bridgework may not be worn into surgery.     Patients discharged the day of surgery will not be allowed to drive home. IF YOU ARE HAVING SURGERY AND GOING HOME THE SAME DAY, YOU MUST HAVE AN ADULT TO DRIVE YOU HOME AND BE WITH YOU FOR 24 HOURS. YOU MAY GO HOME BY TAXI OR UBER OR ORTHERWISE, BUT AN ADULT MUST ACCOMPANY YOU HOME AND STAY WITH YOU FOR 24 HOURS.  Name and phone number of your driver:Karla Desalvo [phone]  Special Instructions: N/A              Please read over the following fact sheets you were given: _____________________________________________________________________  [facility]- Preparing for Total Shoulder Arthroplasty    Before surgery, you can play an important role. Because skin is not sterile, your skin needs to be as free of germs as possible. You can reduce the number of germs on your skin by using the following products. . Benzoyl Peroxide Gel o Reduces the number of germs present on the skin o Applied twice a day to shoulder area starting two days before surgery    ==================================================================  Please follow these instructions carefully:  BENZOYL PEROXIDE 5% GEL  Please do not use if you have an allergy to benzoyl peroxide.   If your skin becomes reddened/irritated stop using the benzoyl peroxide.  Starting two days before surgery, apply as follows: 1. Apply benzoyl peroxide in the morning and at night. Apply after taking a shower. If you are not taking a shower  clean entire shoulder front, back, and side along with the armpit with a clean wet washcloth.  2. Place a quarter-sized dollop on your shoulder and rub in thoroughly, making sure to cover the front, back, and side of your shoulder, along with the armpit.   2 days before ____ AM   ____ PM              1 day before ____ AM   ____ PM                         3. Do this twice a day for two days.  (Last application is the night before surgery, AFTER using the CHG soap as described below).  4. Do NOT apply benzoyl peroxide gel on the day of surgery.            [facility] - Preparing for Surgery Before surgery, you can play an important role.  Because skin is not sterile, your skin needs to be as free of germs as possible.  You can reduce the number of germs on your skin by washing with CHG (chlorahexidine gluconate) soap before surgery.  CHG is an antiseptic cleaner which kills germs and bonds with the skin to continue killing germs even after washing. Please DO NOT use if you have an allergy to CHG or antibacterial soaps.  If your skin becomes reddened/irritated  stop using the CHG and inform your nurse when you arrive at Short Stay. Do not shave (including legs and underarms) for at least 48 hours prior to the first CHG shower.  You may shave your face/neck. Please follow these instructions carefully:  1.  Shower with CHG Soap the night before surgery and the  morning of Surgery.  2.  If you choose to wash your hair, wash your hair first as usual with your  normal  shampoo.  3.  After you shampoo, rinse your hair and body thoroughly to remove the  shampoo.                           4.  Use CHG as you would any other liquid soap.  You can apply chg directly  to the skin and wash                       Gently with a scrungie or clean washcloth.  5.  Apply the CHG Soap to your body ONLY FROM THE NECK DOWN.   Do not use on face/ open                           Wound or open sores. Avoid contact with eyes,  ears mouth and genitals (private parts).                       Wash face,  Genitals (private parts) with your normal soap.             6.  Wash thoroughly, paying special attention to the area where your surgery  will be performed.  7.  Thoroughly rinse your body with warm water from the neck down.  8.  DO NOT shower/wash with your normal soap after using and rinsing off  the CHG Soap.                9.  Pat yourself dry with a clean towel.            10.  Wear clean pajamas.            11.  Place clean sheets on your bed the night of your first shower and do not  sleep with pets. Day of Surgery : Do not apply any lotions/deodorants the morning of surgery.  Please wear clean clothes to the hospital/surgery center.  FAILURE TO FOLLOW THESE INSTRUCTIONS MAY RESULT IN THE CANCELLATION OF YOUR SURGERY PATIENT SIGNATURE_________________________________  NURSE SIGNATURE__________________________________  ________________________________________________________________________ How to Manage Your Diabetes Before and After Surgery  Why is it important to control my blood sugar before and after surgery? . Improving blood sugar levels before and after surgery helps healing and can limit problems. . A way of improving blood sugar control is eating a healthy diet by: o  Eating less sugar and carbohydrates o  Increasing activity/exercise o  Talking with your doctor about reaching your blood sugar goals . High blood sugars (greater than 180 mg/dL) can raise your risk of infections and slow your recovery, so you will need to focus on controlling your diabetes during the weeks before surgery. . Make sure that the doctor who takes care of your diabetes knows about your planned surgery including the date and location.  How do I manage my blood sugar before surgery? . Check your blood sugar at least 4 times a  day, starting 2 days before surgery, to make sure that the level is not too high or low. o Check  your blood sugar the morning of your surgery when you wake up and every 2 hours until you get to the Short Stay unit. . If your blood sugar is less than 70 mg/dL, you will need to treat for low blood sugar: o Do not take insulin. o Treat a low blood sugar (less than 70 mg/dL) with  cup of clear juice (cranberry or apple), 4 glucose tablets, OR glucose gel. o Recheck blood sugar in 15 minutes after treatment (to make sure it is greater than 70 mg/dL). If your blood sugar is not greater than 70 mg/dL on recheck, call [phone] for further instructions. . Report your blood sugar to the short stay nurse when you get to Short Stay.  . If you are admitted to the hospital after surgery: o Your blood sugar will be checked by the staff and you will probably be given insulin after surgery (instead of oral diabetes medicines) to make sure you have good blood sugar levels. o The goal for blood sugar control after surgery is 80-180 mg/dL.   WHAT DO I DO ABOUT MY DIABETES MEDICATION?  [place] Kitchen Do not take oral diabetes medicines (pills) the morning of surgery.  . THE DAY BEFORE SURGERY, take your usual Metformin       Reviewed and Endorsed by [facility] Patient Education Committee, August 2015

## 2020-01-15 NOTE — Progress Notes (Addendum)
PCP - Unk Pinto Cardiologist -   Chest x-ray -  EKG - 01-03-20 Stress Test -  ECHO - 03-01-19 Cardiac Cath -   Sleep Study -  CPAP -   HGA1C 01-03-20 in Epic 6.7  Fasting Blood Sugar -  Checks Blood Sugar _____ times a day  Blood Thinner Instructions: Plavix 75 mg-Hold x 5 days per 01-07-20 Telephone encounter for colonoscopy/ endoscopy.  No instructions noted for shoulder surgery. (Pt to clarify  holding for shoulder surgery, along with holding his Phentermine)   Aspirin Instructions: Last Dose:  Anesthesia review:   Patient denies shortness of breath, fever, cough and chest pain at PAT appointment   Patient verbalized understanding of instructions that were given to them at the PAT appointment. Patient was also instructed that they will need to review over the PAT instructions again at home before surgery.

## 2020-01-16 ENCOUNTER — Other Ambulatory Visit: Payer: Self-pay

## 2020-01-16 ENCOUNTER — Encounter (HOSPITAL_COMMUNITY)
Admission: RE | Admit: 2020-01-16 | Discharge: 2020-01-16 | Disposition: A | Discharge status: Home / Self Care | Payer: 59 | Source: Ambulatory Visit | Attending: Orthopedic Surgery | Admitting: Orthopedic Surgery

## 2020-01-16 ENCOUNTER — Encounter (HOSPITAL_COMMUNITY): Payer: Self-pay

## 2020-01-16 ENCOUNTER — Ambulatory Visit (HOSPITAL_COMMUNITY)
Admission: RE | Admit: 2020-01-16 | Discharge: 2020-01-16 | Disposition: A | Discharge status: Home / Self Care | Payer: 59 | Source: Ambulatory Visit | Attending: Orthopedic Surgery | Admitting: Orthopedic Surgery

## 2020-01-16 DIAGNOSIS — Z01811 Encounter for preprocedural respiratory examination: Secondary | ICD-10-CM | POA: Insufficient documentation

## 2020-01-16 DIAGNOSIS — Z01818 Encounter for other preprocedural examination: Secondary | ICD-10-CM

## 2020-01-16 LAB — COMPREHENSIVE METABOLIC PANEL
ALT: 27 U/L (ref 0–44)
AST: 26 U/L (ref 15–41)
Albumin: 4 g/dL (ref 3.5–5.0)
Alkaline Phosphatase: 72 U/L (ref 38–126)
Anion gap: 10 (ref 5–15)
BUN: 28 mg/dL — ABNORMAL HIGH (ref 8–23)
CO2: 19 mmol/L — ABNORMAL LOW (ref 22–32)
Calcium: 9.1 mg/dL (ref 8.9–10.3)
Chloride: 107 mmol/L (ref 98–111)
Creatinine, Ser: 1.15 mg/dL (ref 0.61–1.24)
GFR calc Af Amer: >60 mL/min (ref >60)
GFR calc non Af Amer: >60 mL/min (ref >60)
Glucose, Bld: 107 mg/dL — ABNORMAL HIGH (ref 70–99)
Potassium: 4.4 mmol/L (ref 3.5–5.1)
Sodium: 136 mmol/L (ref 135–145)
Total Bilirubin: 0.6 mg/dL (ref 0.3–1.2)
Total Protein: 7.3 g/dL (ref 6.5–8.1)

## 2020-01-16 LAB — CBC WITH DIFFERENTIAL/PLATELET
Abs Immature Granulocytes: 0.03 10*3/uL (ref 0.00–0.07)
Basophils Absolute: 0.1 10*3/uL (ref 0.0–0.1)
Basophils Relative: 1 %
Eosinophils Absolute: 0.3 10*3/uL (ref 0.0–0.5)
Eosinophils Relative: 3 %
HCT: 40.9 % (ref 39.0–52.0)
Hemoglobin: 12.6 g/dL — ABNORMAL LOW (ref 13.0–17.0)
Immature Granulocytes: 0 %
Lymphocytes Relative: 24 %
Lymphs Abs: 2 10*3/uL (ref 0.7–4.0)
MCH: 24.7 pg — ABNORMAL LOW (ref 26.0–34.0)
MCHC: 30.8 g/dL (ref 30.0–36.0)
MCV: 80.2 fL (ref 80.0–100.0)
Monocytes Absolute: 0.6 10*3/uL (ref 0.1–1.0)
Monocytes Relative: 7 %
Neutro Abs: 5.4 10*3/uL (ref 1.7–7.7)
Neutrophils Relative %: 65 %
Platelets: 297 10*3/uL (ref 150–400)
RBC: 5.1 MIL/uL (ref 4.22–5.81)
RDW: 15.7 % — ABNORMAL HIGH (ref 11.5–15.5)
WBC: 8.3 10*3/uL (ref 4.0–10.5)
nRBC: 0 % (ref 0.0–0.2)

## 2020-01-16 LAB — PROTIME-INR
INR: 1.1 (ref 0.8–1.2)
Prothrombin Time: 13.3 seconds (ref 11.4–15.2)

## 2020-01-16 LAB — APTT: aPTT: 35 seconds (ref 24–36)

## 2020-01-16 LAB — URINALYSIS, ROUTINE W REFLEX MICROSCOPIC
Bilirubin Urine: NEGATIVE
Glucose, UA: NEGATIVE mg/dL
Hgb urine dipstick: NEGATIVE
Ketones, ur: NEGATIVE mg/dL
Leukocytes,Ua: NEGATIVE
Nitrite: NEGATIVE
Protein, ur: NEGATIVE mg/dL
Specific Gravity, Urine: 1.017 (ref 1.005–1.030)
pH: 5 (ref 5.0–8.0)

## 2020-01-16 LAB — SURGICAL PCR SCREEN
MRSA, PCR: NEGATIVE
Staphylococcus aureus: NEGATIVE

## 2020-01-16 LAB — ABO/RH: ABO/RH(D): B POS

## 2020-01-18 ENCOUNTER — Ambulatory Visit (AMBULATORY_SURGERY_CENTER): Payer: 59 | Admitting: Internal Medicine

## 2020-01-18 ENCOUNTER — Other Ambulatory Visit: Payer: Self-pay

## 2020-01-18 ENCOUNTER — Encounter: Payer: Self-pay | Admitting: Internal Medicine

## 2020-01-18 VITALS — BP 126/59 | HR 55 | Temp 97.1°F | Resp 12 | Ht 70.0 in | Wt 334.0 lb

## 2020-01-18 DIAGNOSIS — K644 Residual hemorrhoidal skin tags: Secondary | ICD-10-CM

## 2020-01-18 DIAGNOSIS — K317 Polyp of stomach and duodenum: Secondary | ICD-10-CM | POA: Diagnosis not present

## 2020-01-18 DIAGNOSIS — K2970 Gastritis, unspecified, without bleeding: Secondary | ICD-10-CM | POA: Diagnosis not present

## 2020-01-18 DIAGNOSIS — K5730 Diverticulosis of large intestine without perforation or abscess without bleeding: Secondary | ICD-10-CM | POA: Diagnosis not present

## 2020-01-18 DIAGNOSIS — K648 Other hemorrhoids: Secondary | ICD-10-CM | POA: Diagnosis not present

## 2020-01-18 DIAGNOSIS — D120 Benign neoplasm of cecum: Secondary | ICD-10-CM

## 2020-01-18 DIAGNOSIS — R195 Other fecal abnormalities: Secondary | ICD-10-CM | POA: Diagnosis not present

## 2020-01-18 DIAGNOSIS — D509 Iron deficiency anemia, unspecified: Secondary | ICD-10-CM

## 2020-01-18 DIAGNOSIS — D123 Benign neoplasm of transverse colon: Secondary | ICD-10-CM | POA: Diagnosis not present

## 2020-01-18 DIAGNOSIS — K2950 Unspecified chronic gastritis without bleeding: Secondary | ICD-10-CM

## 2020-01-18 MED ORDER — SODIUM CHLORIDE 0.9 % IV SOLN: 500.0000 mL | INTRAVENOUS | Status: DC

## 2020-01-18 NOTE — Progress Notes (Signed)
Report to PACU, RN, vss, BBS= Clear.  

## 2020-01-18 NOTE — Progress Notes (Signed)
Called to room to assist during endoscopic procedure.  Patient ID and intended procedure confirmed with present staff. Received instructions for my participation in the procedure from the performing physician.  

## 2020-01-18 NOTE — Patient Instructions (Addendum)
HANDOUTS PROVIDED ON: POLYPS, DIVERTICULOSIS, & HEMORRHOIDS  The polyps removed/biopsies taken today have been sent for pathology.  The results can take 1-3 weeks to receive.  When your next colonoscopy should occur will be based on the pathology results.    You may resume your previous diet.  CONTINUE TO HOLD PLAVIX UNTIL AFTER YOUR SHOULDER SURGERY.  Thank you for allowing [place] to care for you today!!!   YOU HAD AN ENDOSCOPIC PROCEDURE TODAY AT [place]:   Refer to the procedure report that was given to you for any specific questions about what was found during the examination.  If the procedure report does not answer your questions, please call your gastroenterologist to clarify.  If you requested that your care partner not be given the details of your procedure findings, then the procedure report has been included in a sealed envelope for you to review at your convenience later.  YOU SHOULD EXPECT: Some feelings of bloating in the abdomen. Passage of more gas than usual.  Walking can help get rid of the air that was put into your GI tract during the procedure and reduce the bloating. If you had a lower endoscopy (such as a colonoscopy or flexible sigmoidoscopy) you may notice spotting of blood in your stool or on the toilet paper. If you underwent a bowel prep for your procedure, you may not have a normal bowel movement for a few days.  Please Note:  You might notice some irritation and congestion in your nose or some drainage.  This is from the oxygen used during your procedure.  There is no need for concern and it should clear up in a day or so.  SYMPTOMS TO REPORT IMMEDIATELY:   Following lower endoscopy (colonoscopy or flexible sigmoidoscopy):  Excessive amounts of blood in the stool  Significant tenderness or worsening of abdominal pains  Swelling of the abdomen that is new, acute  Fever of 100F or higher   Following upper endoscopy (EGD)  Vomiting of blood or  coffee ground material  New chest pain or pain under the shoulder blades  Painful or persistently difficult swallowing  New shortness of breath  Fever of 100F or higher  Black, tarry-looking stools  For urgent or emergent issues, a gastroenterologist can be reached at any hour by calling [phone]. Do not use MyChart messaging for urgent concerns.    DIET:  We do recommend a small meal at first, but then you may proceed to your regular diet.  Drink plenty of fluids but you should avoid alcoholic beverages for 24 hours.  ACTIVITY:  You should plan to take it easy for the rest of today and you should NOT DRIVE or use heavy machinery until tomorrow (because of the sedation medicines used during the test).    FOLLOW UP: Our staff will call the number listed on your records 48-72 hours following your procedure to check on you and address any questions or concerns that you may have regarding the information given to you following your procedure. If we do not reach you, we will leave a message.  We will attempt to reach you two times.  During this call, we will ask if you have developed any symptoms of COVID 19. If you develop any symptoms (ie: fever, flu-like symptoms, shortness of breath, cough etc.) before then, please call [phone].  If you test positive for Covid 19 in the 2 weeks post procedure, please call and report this information to [place].  If any biopsies were taken you will be contacted by phone or by letter within the next 1-3 weeks.  Please call us at [phone] if you have not heard about the biopsies in 3 weeks.    SIGNATURES/CONFIDENTIALITY: You and/or your care partner have signed paperwork which will be entered into your electronic medical record.  These signatures attest to the fact that that the information above on your After Visit Summary has been reviewed and is understood.  Full responsibility of the confidentiality of this discharge information lies with you  and/or your care-partner.

## 2020-01-18 NOTE — Op Note (Signed)
[facility] Patient Name: Dustin Ayala Procedure Date: 01/18/2020 2:13 PM MRN: [id number] Endoscopist: Jerene Bears , MD Age: 64 Referring MD:  Date of Birth: Oct 21, 1955 Gender: Male Account #: [id number] Procedure:                Upper GI endoscopy Indications:              Iron deficiency anemia, Heme positive stool,                            history of GERD with reported history of hiatal                            hernia Medicines:                Monitored Anesthesia Care Procedure:                Pre-Anesthesia Assessment:                           - Prior to the procedure, a History and Physical                            was performed, and patient medications and                            allergies were reviewed. The patient's tolerance of                            previous anesthesia was also reviewed. The risks                            and benefits of the procedure and the sedation                            options and risks were discussed with the patient.                            All questions were answered, and informed consent                            was obtained. Prior Anticoagulants: The patient has                            taken Plavix (clopidogrel), last dose was 5 days                            prior to procedure. ASA Grade Assessment: III - A                            patient with severe systemic disease. After                            reviewing the risks and benefits, the patient was  deemed in satisfactory condition to undergo the                            procedure.                           After obtaining informed consent, the endoscope was                            passed under direct vision. Throughout the                            procedure, the patient's blood pressure, pulse, and                            oxygen saturations were monitored continuously. The                            Endoscope was  introduced through the mouth, and                            advanced to the second part of duodenum. The upper                            GI endoscopy was accomplished without difficulty.                            The patient tolerated the procedure well. Scope In: Scope Out: Findings:                 The examined esophagus was normal.                           The gastroesophageal flap valve was visualized                            endoscopically and classified as Hill Grade II                            (fold present, opens with respiration).                           Multiple 3 to 7 mm sessile polyps with no bleeding                            were found in the gastric fundus and in the gastric                            body. These appear benign and have the typical                            appearance of fundic gland polyps. Biopsies were                            taken with  a cold forceps for sampling from                            multiple different polyps for histology.                           Mild inflammation characterized by erythema was                            found in the gastric body. Biopsies were taken with                            a cold forceps for histology and Helicobacter                            pylori testing.                           The examined duodenum was normal. Biopsies were                            taken with a cold forceps for histology. Complications:            No immediate complications. Estimated Blood Loss:     Estimated blood loss was minimal. Impression:               - Normal esophagus.                           - Multiple gastric polyps. Biopsied.                           - Gastritis. Biopsied.                           - Normal examined duodenum. Biopsied. Recommendation:           - Patient has a contact number available for                            emergencies. The signs and symptoms of potential                             delayed complications were discussed with the                            patient. Return to normal activities tomorrow.                            Written discharge instructions were provided to the                            patient.                           - Resume previous diet.                           -  Continue present medications.                           - Await pathology results.                           - See the other procedure note for documentation of                            additional recommendations. Jerene Bears, MD 01/18/2020 3:05:44 PM This report has been signed electronically.

## 2020-01-18 NOTE — Op Note (Signed)
[place] Patient Name: Dustin Ayala Procedure Date: 01/18/2020 2:12 PM MRN: [id number] Endoscopist: Jerene Bears , MD Age: 64 Referring MD:  Date of Birth: 03-31-1956 Gender: Male Account #: [id number] Procedure:                Colonoscopy Indications:              Heme positive stool, Iron deficiency anemia Medicines:                Monitored Anesthesia Care Procedure:                Pre-Anesthesia Assessment:                           - Prior to the procedure, a History and Physical                            was performed, and patient medications and                            allergies were reviewed. The patient's tolerance of                            previous anesthesia was also reviewed. The risks                            and benefits of the procedure and the sedation                            options and risks were discussed with the patient.                            All questions were answered, and informed consent                            was obtained. Prior Anticoagulants: The patient has                            taken Plavix (clopidogrel), last dose was 5 days                            prior to procedure. ASA Grade Assessment: III - A                            patient with severe systemic disease. After                            reviewing the risks and benefits, the patient was                            deemed in satisfactory condition to undergo the                            procedure.  After obtaining informed consent, the colonoscope                            was passed under direct vision. Throughout the                            procedure, the patient's blood pressure, pulse, and                            oxygen saturations were monitored continuously. The                            Colonoscope was introduced through the anus and                            advanced to the cecum, identified by appendiceal                     orifice and ileocecal valve. The colonoscopy was                            performed without difficulty. The patient tolerated                            the procedure well. The quality of the bowel                            preparation was good. The ileocecal valve,                            appendiceal orifice, and rectum were photographed. Scope In: 2:44:00 PM Scope Out: 2:58:13 PM Scope Withdrawal Time: 0 hours 12 minutes 8 seconds  Total Procedure Duration: 0 hours 14 minutes 13 seconds  Findings:                 The digital rectal exam was normal.                           A 2 mm polyp was found in the cecum. The polyp was                            sessile. The polyp was removed with a cold snare.                            Resection and retrieval were complete.                           Three sessile polyps were found in the transverse                            colon. The polyps were 3 to 6 mm in size. These                            polyps were removed with a cold snare. Resection  and retrieval were complete.                           A few small-mouthed diverticula were found in the                            sigmoid colon and ascending colon.                           Internal hemorrhoids and anal skin tags were found                            during retroflexion. The hemorrhoids were small. Complications:            No immediate complications. Estimated Blood Loss:     Estimated blood loss was minimal. Impression:               - One 2 mm polyp in the cecum, removed with a cold                            snare. Resected and retrieved.                           - Three 3 to 6 mm polyps in the transverse colon,                            removed with a cold snare. Resected and retrieved.                           - Diverticulosis in the sigmoid colon and in the                            ascending colon.                            - Small internal hemorrhoids with perianal skin                            tags. Recommendation:           - Patient has a contact number available for                            emergencies. The signs and symptoms of potential                            delayed complications were discussed with the                            patient. Return to normal activities tomorrow.                            Written discharge instructions were provided to the  patient.                           - Resume previous diet.                           - Continue present medications.                           - Await pathology results.                           - Resume Plavix (clopidogrel) at prior dose                            tomorrow. Refer to managing physician for further                            adjustment of therapy.                           - Continue oral iron therapy. CBC with ferritin+                            IBC is recommended in 12 weeks. Depending on                            pathology results will consider video capsule                            endoscopy to examine remaining small bowel to                            complete evaluation of iron deficiency.                           - Repeat colonoscopy is recommended. The                            colonoscopy date will be determined after pathology                            results from today's exam become available for                            review. Jerene Bears, MD 01/18/2020 3:10:17 PM This report has been signed electronically.

## 2020-01-21 ENCOUNTER — Other Ambulatory Visit (HOSPITAL_COMMUNITY)
Admission: RE | Admit: 2020-01-21 | Discharge: 2020-01-21 | Disposition: A | Discharge status: Home / Self Care | Payer: 59 | Source: Ambulatory Visit | Attending: Orthopedic Surgery | Admitting: Orthopedic Surgery

## 2020-01-21 ENCOUNTER — Other Ambulatory Visit: Payer: Self-pay

## 2020-01-21 DIAGNOSIS — Z20822 Contact with and (suspected) exposure to covid-19: Secondary | ICD-10-CM | POA: Diagnosis not present

## 2020-01-21 DIAGNOSIS — Z01812 Encounter for preprocedural laboratory examination: Secondary | ICD-10-CM | POA: Insufficient documentation

## 2020-01-21 DIAGNOSIS — D509 Iron deficiency anemia, unspecified: Secondary | ICD-10-CM

## 2020-01-21 LAB — SARS CORONAVIRUS 2 (TAT 6-24 HRS): SARS Coronavirus 2: NEGATIVE

## 2020-01-22 ENCOUNTER — Telehealth: Payer: Self-pay | Admitting: *Deleted

## 2020-01-22 MED ORDER — DEXAMETHASONE SODIUM PHOSPHATE 10 MG/ML IJ SOLN
INTRAMUSCULAR | Status: AC
  Filled 2020-01-22: qty 1

## 2020-01-22 MED ORDER — FENTANYL CITRATE (PF) 100 MCG/2ML IJ SOLN
INTRAMUSCULAR | Status: AC
  Filled 2020-01-22: qty 2

## 2020-01-22 MED ORDER — EPHEDRINE 5 MG/ML INJ
INTRAVENOUS | Status: AC
  Filled 2020-01-22: qty 10

## 2020-01-22 MED ORDER — MIDAZOLAM HCL 2 MG/2ML IJ SOLN
INTRAMUSCULAR | Status: AC
  Filled 2020-01-22: qty 2

## 2020-01-22 MED ORDER — PROPOFOL 10 MG/ML IV BOLUS
INTRAVENOUS | Status: AC
  Filled 2020-01-22: qty 20

## 2020-01-22 MED ORDER — LIDOCAINE 2% (20 MG/ML) 5 ML SYRINGE
INTRAMUSCULAR | Status: AC
  Filled 2020-01-22: qty 5

## 2020-01-22 MED ORDER — ONDANSETRON HCL 4 MG/2ML IJ SOLN
INTRAMUSCULAR | Status: AC
  Filled 2020-01-22: qty 2

## 2020-01-22 NOTE — Telephone Encounter (Signed)
  Follow up Call-  Call back number 01/18/2020  Post procedure Call Back phone  # [phone]  Permission to leave phone message Yes     Patient questions:  Do you have a fever, pain , or abdominal swelling? No. Pain Score  0 *  Have you tolerated food without any problems? Yes.    Have you been able to return to your normal activities? Yes.    Do you have any questions about your discharge instructions: Diet   No. Medications  No. Follow up visit  No.  Do you have questions or concerns about your Care? No.  Actions: * If pain score is 4 or above: No action needed, pain <4.  1. Have you developed a fever since your procedure? no  2.   Have you had an respiratory symptoms (SOB or cough) since your procedure? no  3.   Have you tested positive for COVID 19 since your procedure no  4.   Have you had any family members/close contacts diagnosed with the COVID 19 since your procedure? no   If yes to any of these questions please route to Joylene John, RN and Erenest Rasher, RN

## 2020-01-23 MED ORDER — DEXTROSE 5 % IV SOLN
3.0000 g | INTRAVENOUS | Status: AC
  Filled 2020-01-23: qty 3000

## 2020-01-24 LAB — TYPE AND SCREEN
ABO/RH(D): B POS
Antibody Screen: NEGATIVE

## 2020-01-25 ENCOUNTER — Other Ambulatory Visit: Payer: Self-pay | Admitting: Physician Assistant

## 2020-01-31 NOTE — Progress Notes (Signed)
PCP - Unk Pinto  lov 01-03-20 epic Cardiologist -   Chest x-ray - 01-16-20 epic EKG - 01-03-20 Stress Test -  ECHO - 03-01-19 Cardiac Cath -   Sleep Study -  CPAP -   HGA1C 01-03-20 in Epic 6.7   Fasting Blood Sugar -  Checks Blood Sugar _____ times a day  Blood Thinner Instructions: Plavix 75 mg  Hold 7-10 days last dose 01-30-20  Been off phentermine 2 weeks Aspirin Instructions: Last Dose:  Anesthesia review: OSA no cpap does not tolerate, plavix, Hx. TIA  Patient denies shortness of breath, fever, cough and chest pain at PAT appointment NONE   Patient verbalized understanding of instructions that were given to them at the PAT appointment. Patient was also instructed that they will need to review over the PAT instructions again at home before surgery.

## 2020-01-31 NOTE — Patient Instructions (Addendum)
DUE TO COVID-19 ONLY ONE VISITOR IS ALLOWED TO COME WITH YOU AND STAY IN THE WAITING ROOM ONLY DURING PRE OP AND PROCEDURE DAY OF SURGERY. TWO  VISITOR MAY VISIT WITH YOU AFTER SURGERY IN YOUR PRIVATE ROOM DURING VISITING HOURS ONLY!  YOU NEED TO HAVE A COVID 19 TEST ON 5- 24-21   @ 10:30  AM, THIS TEST MUST BE DONE BEFORE SURGERY, COME  [place], [place] , [address].  ([place]) ONCE YOUR COVID TEST IS COMPLETED, PLEASE BEGIN THE QUARANTINE INSTRUCTIONS AS OUTLINED IN YOUR HANDOUT.                Dustin Ayala  01/31/2020   Your procedure is scheduled on: 02-07-20   Report to [facility] Main  Entrance    Report to admitting                   at  1010 am     Call this number if you have problems the morning of surgery [phone]    Remember: AFTER MIDNIGHT THE NIGHT PRIOR TO SURGERY. NOTHING BY MOUTH EXCEPT CLEAR LIQUIDS UNTIL 0920 am . PLEASE FINISH G2 DRINK PER SURGEON ORDER  WHICH NEEDS TO BE COMPLETED AT 0920 am then nothing by mouth    CLEAR LIQUID DIET   Foods Allowed                                                                         Foods Excluded  Coffee and tea, regular and decaf   No creamer                          liquids that you cannot  Plain Jell-O any favor except red or purple                                           see through such as: Fruit ices (not with fruit pulp)                                                        milk, soups, orange juice  Iced Popsicles                                                     All solid food Carbonated beverages, regular and diet                                    Cranberry, grape and apple juices Sports drinks like Gatorade Lightly seasoned clear broth or consume(fat free) Sugar, honey syrup   _____________________________________________________________________     Take these medicines the morning of surgery with A SIP OF  WATER: Atorvastatin (Lipitor), Carvedilol (Coreg),  Eetimibe (Zetia), and Omeprazole (Prilosec)  BRUSH YOUR TEETH MORNING OF SURGERY AND RINSE YOUR MOUTH OUT, NO CHEWING GUM CANDY OR MINTS.   DO NOT TAKE ANY DIABETIC MEDICATIONS DAY OF YOUR SURGERY                               You may not have any metal on your body including hair pins and              piercings     Do not wear jewelry, cologne, lotions, powders or deodorant                Men may shave face and neck.   Do not bring valuables to the hospital. [place].  Contacts, dentures or bridgework may not be worn into surgery.     Patients discharged the day of surgery will not be allowed to drive home. IF YOU ARE HAVING SURGERY AND GOING HOME THE SAME DAY, YOU MUST HAVE AN ADULT TO DRIVE YOU HOME AND BE WITH YOU FOR 24 HOURS. YOU MAY GO HOME BY TAXI OR UBER OR ORTHERWISE, BUT AN ADULT MUST ACCOMPANY YOU HOME AND STAY WITH YOU FOR 24 HOURS.  Name and phone number of your driver:Karla Gephart [phone]  Special Instructions: N/A              Please read over the following fact sheets you were given: _____________________________________________________________________  [facility]- Preparing for Total Shoulder Arthroplasty    Before surgery, you can play an important role. Because skin is not sterile, your skin needs to be as free of germs as possible. You can reduce the number of germs on your skin by using the following products. . Benzoyl Peroxide Gel o Reduces the number of germs present on the skin o Applied twice a day to shoulder area starting two days before surgery    ==================================================================  Please follow these instructions carefully:  BENZOYL PEROXIDE 5% GEL  Please do not use if you have an allergy to benzoyl peroxide.   If your skin becomes reddened/irritated stop using the benzoyl peroxide.  Starting two days before surgery, apply as follows: 1. Apply benzoyl  peroxide in the morning and at night. Apply after taking a shower. If you are not taking a shower clean entire shoulder front, back, and side along with the armpit with a clean wet washcloth.  2. Place a quarter-sized dollop on your shoulder and rub in thoroughly, making sure to cover the front, back, and side of your shoulder, along with the armpit.   2 days before ____ AM   ____ PM              1 day before ____ AM   ____ PM                         3. Do this twice a day for two days.  (Last application is the night before surgery, AFTER using the CHG soap as described below).  4. Do NOT apply benzoyl peroxide gel on the day of surgery.            [facility] - Preparing for Surgery Before surgery, you can play an important role.  Because skin is not sterile, your  skin needs to be as free of germs as possible.  You can reduce the number of germs on your skin by washing with CHG (chlorahexidine gluconate) soap before surgery.  CHG is an antiseptic cleaner which kills germs and bonds with the skin to continue killing germs even after washing. Please DO NOT use if you have an allergy to CHG or antibacterial soaps.  If your skin becomes reddened/irritated stop using the CHG and inform your nurse when you arrive at Short Stay. Do not shave (including legs and underarms) for at least 48 hours prior to the first CHG shower.  You may shave your face/neck. Please follow these instructions carefully:  1.  Shower with CHG Soap the night before surgery and the  morning of Surgery.  2.  If you choose to wash your hair, wash your hair first as usual with your  normal  shampoo.  3.  After you shampoo, rinse your hair and body thoroughly to remove the  shampoo.                           4.  Use CHG as you would any other liquid soap.  You can apply chg directly  to the skin and wash                       Gently with a scrungie or clean washcloth.  5.  Apply the CHG Soap to your body ONLY FROM THE NECK DOWN.    Do not use on face/ open                           Wound or open sores. Avoid contact with eyes, ears mouth and genitals (private parts).                       Wash face,  Genitals (private parts) with your normal soap.             6.  Wash thoroughly, paying special attention to the area where your surgery  will be performed.  7.  Thoroughly rinse your body with warm water from the neck down.  8.  DO NOT shower/wash with your normal soap after using and rinsing off  the CHG Soap.                9.  Pat yourself dry with a clean towel.            10.  Wear clean pajamas.            11.  Place clean sheets on your bed the night of your first shower and do not  sleep with pets. Day of Surgery : Do not apply any lotions/deodorants the morning of surgery.  Please wear clean clothes to the hospital/surgery center.  FAILURE TO FOLLOW THESE INSTRUCTIONS MAY RESULT IN THE CANCELLATION OF YOUR SURGERY PATIENT SIGNATURE_________________________________  NURSE SIGNATURE__________________________________  ________________________________________________________________________ How to Manage Your Diabetes Before and After Surgery  Why is it important to control my blood sugar before and after surgery? . Improving blood sugar levels before and after surgery helps healing and can limit problems. . A way of improving blood sugar control is eating a healthy diet by: o  Eating less sugar and carbohydrates o  Increasing activity/exercise o  Talking with your doctor about reaching your blood sugar goals . High blood sugars (  greater than 180 mg/dL) can raise your risk of infections and slow your recovery, so you will need to focus on controlling your diabetes during the weeks before surgery. . Make sure that the doctor who takes care of your diabetes knows about your planned surgery including the date and location.  How do I manage my blood sugar before surgery? . Check your blood sugar at least 4 times a  day, starting 2 days before surgery, to make sure that the level is not too high or low. o Check your blood sugar the morning of your surgery when you wake up and every 2 hours until you get to the Short Stay unit. . If your blood sugar is less than 70 mg/dL, you will need to treat for low blood sugar: o Do not take insulin. o Treat a low blood sugar (less than 70 mg/dL) with  cup of clear juice (cranberry or apple), 4 glucose tablets, OR glucose gel. o Recheck blood sugar in 15 minutes after treatment (to make sure it is greater than 70 mg/dL). If your blood sugar is not greater than 70 mg/dL on recheck, call [phone] for further instructions. . Report your blood sugar to the short stay nurse when you get to Short Stay.  . If you are admitted to the hospital after surgery: o Your blood sugar will be checked by the staff and you will probably be given insulin after surgery (instead of oral diabetes medicines) to make sure you have good blood sugar levels. o The goal for blood sugar control after surgery is 80-180 mg/dL.   WHAT DO I DO ABOUT MY DIABETES MEDICATION?  Marland Kitchen Do not take oral diabetes medicines (pills) the morning of surgery.

## 2020-02-01 ENCOUNTER — Other Ambulatory Visit: Payer: Self-pay

## 2020-02-01 ENCOUNTER — Encounter (HOSPITAL_COMMUNITY): Payer: Self-pay

## 2020-02-01 ENCOUNTER — Encounter (HOSPITAL_COMMUNITY)
Admission: RE | Admit: 2020-02-01 | Discharge: 2020-02-01 | Disposition: A | Discharge status: Home / Self Care | Payer: 59 | Source: Ambulatory Visit | Attending: Orthopedic Surgery | Admitting: Orthopedic Surgery

## 2020-02-01 DIAGNOSIS — D509 Iron deficiency anemia, unspecified: Secondary | ICD-10-CM

## 2020-02-01 DIAGNOSIS — Z01812 Encounter for preprocedural laboratory examination: Secondary | ICD-10-CM | POA: Insufficient documentation

## 2020-02-03 ENCOUNTER — Other Ambulatory Visit: Payer: Self-pay | Admitting: Internal Medicine

## 2020-02-03 DIAGNOSIS — E0822 Diabetes mellitus due to underlying condition with diabetic chronic kidney disease: Secondary | ICD-10-CM

## 2020-02-03 DIAGNOSIS — Z794 Long term (current) use of insulin: Secondary | ICD-10-CM

## 2020-02-03 DIAGNOSIS — N182 Chronic kidney disease, stage 2 (mild): Secondary | ICD-10-CM

## 2020-02-03 DIAGNOSIS — I10 Essential (primary) hypertension: Secondary | ICD-10-CM

## 2020-02-03 DIAGNOSIS — I872 Venous insufficiency (chronic) (peripheral): Secondary | ICD-10-CM

## 2020-02-04 ENCOUNTER — Encounter (HOSPITAL_COMMUNITY)
Admission: RE | Admit: 2020-02-04 | Discharge: 2020-02-04 | Disposition: A | Discharge status: Home / Self Care | Payer: 59 | Source: Ambulatory Visit | Attending: Orthopedic Surgery | Admitting: Orthopedic Surgery

## 2020-02-04 ENCOUNTER — Other Ambulatory Visit (HOSPITAL_COMMUNITY)
Admission: RE | Admit: 2020-02-04 | Discharge: 2020-02-04 | Disposition: A | Discharge status: Home / Self Care | Payer: 59 | Source: Ambulatory Visit | Attending: Orthopedic Surgery | Admitting: Orthopedic Surgery

## 2020-02-04 ENCOUNTER — Other Ambulatory Visit: Payer: Self-pay

## 2020-02-04 DIAGNOSIS — Z01812 Encounter for preprocedural laboratory examination: Secondary | ICD-10-CM | POA: Diagnosis not present

## 2020-02-04 DIAGNOSIS — Z20822 Contact with and (suspected) exposure to covid-19: Secondary | ICD-10-CM | POA: Diagnosis not present

## 2020-02-04 LAB — BASIC METABOLIC PANEL
Anion gap: 10 (ref 5–15)
BUN: 35 mg/dL — ABNORMAL HIGH (ref 8–23)
CO2: 21 mmol/L — ABNORMAL LOW (ref 22–32)
Calcium: 9.5 mg/dL (ref 8.9–10.3)
Chloride: 109 mmol/L (ref 98–111)
Creatinine, Ser: 1.52 mg/dL — ABNORMAL HIGH (ref 0.61–1.24)
GFR calc Af Amer: 55 mL/min — ABNORMAL LOW (ref >60)
GFR calc non Af Amer: 48 mL/min — ABNORMAL LOW (ref >60)
Glucose, Bld: 119 mg/dL — ABNORMAL HIGH (ref 70–99)
Potassium: 4.5 mmol/L (ref 3.5–5.1)
Sodium: 140 mmol/L (ref 135–145)

## 2020-02-04 LAB — CBC
HCT: 39.7 % (ref 39.0–52.0)
Hemoglobin: 12.4 g/dL — ABNORMAL LOW (ref 13.0–17.0)
MCH: 24.8 pg — ABNORMAL LOW (ref 26.0–34.0)
MCHC: 31.2 g/dL (ref 30.0–36.0)
MCV: 79.6 fL — ABNORMAL LOW (ref 80.0–100.0)
Platelets: 341 10*3/uL (ref 150–400)
RBC: 4.99 MIL/uL (ref 4.22–5.81)
RDW: 15.6 % — ABNORMAL HIGH (ref 11.5–15.5)
WBC: 9.1 10*3/uL (ref 4.0–10.5)
nRBC: 0 % (ref 0.0–0.2)

## 2020-02-04 LAB — SARS CORONAVIRUS 2 (TAT 6-24 HRS): SARS Coronavirus 2: NEGATIVE

## 2020-02-04 LAB — SURGICAL PCR SCREEN
MRSA, PCR: NEGATIVE
Staphylococcus aureus: NEGATIVE

## 2020-02-04 LAB — GLUCOSE, CAPILLARY: Glucose-Capillary: 119 mg/dL — ABNORMAL HIGH (ref 70–99)

## 2020-02-05 ENCOUNTER — Other Ambulatory Visit: Payer: Self-pay | Admitting: Orthopedic Surgery

## 2020-02-06 ENCOUNTER — Other Ambulatory Visit: Payer: Self-pay | Admitting: Physician Assistant

## 2020-02-06 ENCOUNTER — Other Ambulatory Visit: Payer: Self-pay | Admitting: Orthopedic Surgery

## 2020-02-06 NOTE — Progress Notes (Signed)
Judy from Dr. Rodman Key office called . No orders at preop pt. Surgery tomorrow orders were re entered   by Bethena Roys and need a second sign . Per Bethena Roys Dr. Tamera Punt aware orders need a second sign and pt. Already had preop Additional lab orders will have to be done DOS if second  sign completed .

## 2020-02-07 ENCOUNTER — Encounter (HOSPITAL_COMMUNITY): Payer: Self-pay | Admitting: Orthopedic Surgery

## 2020-02-07 ENCOUNTER — Ambulatory Visit (HOSPITAL_COMMUNITY): Payer: 59 | Admitting: Physician Assistant

## 2020-02-07 ENCOUNTER — Ambulatory Visit (HOSPITAL_COMMUNITY)
Admission: RE | Admit: 2020-02-07 | Discharge: 2020-02-07 | Disposition: A | Discharge status: Home / Self Care | Payer: 59 | Attending: Orthopedic Surgery | Admitting: Orthopedic Surgery

## 2020-02-07 ENCOUNTER — Other Ambulatory Visit: Payer: Self-pay | Admitting: Internal Medicine

## 2020-02-07 ENCOUNTER — Ambulatory Visit (HOSPITAL_COMMUNITY): Payer: 59 | Admitting: Anesthesiology

## 2020-02-07 ENCOUNTER — Ambulatory Visit (HOSPITAL_COMMUNITY): Payer: 59

## 2020-02-07 ENCOUNTER — Encounter (HOSPITAL_COMMUNITY)
Admission: RE | Disposition: A | Discharge status: Home / Self Care | Payer: Self-pay | Source: Home / Self Care | Attending: Orthopedic Surgery

## 2020-02-07 DIAGNOSIS — Z8673 Personal history of transient ischemic attack (TIA), and cerebral infarction without residual deficits: Secondary | ICD-10-CM | POA: Diagnosis not present

## 2020-02-07 DIAGNOSIS — M19012 Primary osteoarthritis, left shoulder: Secondary | ICD-10-CM | POA: Diagnosis not present

## 2020-02-07 DIAGNOSIS — K219 Gastro-esophageal reflux disease without esophagitis: Secondary | ICD-10-CM | POA: Insufficient documentation

## 2020-02-07 DIAGNOSIS — G4730 Sleep apnea, unspecified: Secondary | ICD-10-CM | POA: Diagnosis not present

## 2020-02-07 DIAGNOSIS — Z79899 Other long term (current) drug therapy: Secondary | ICD-10-CM | POA: Diagnosis not present

## 2020-02-07 DIAGNOSIS — Z881 Allergy status to other antibiotic agents status: Secondary | ICD-10-CM | POA: Diagnosis not present

## 2020-02-07 DIAGNOSIS — E785 Hyperlipidemia, unspecified: Secondary | ICD-10-CM | POA: Diagnosis not present

## 2020-02-07 DIAGNOSIS — I10 Essential (primary) hypertension: Secondary | ICD-10-CM | POA: Insufficient documentation

## 2020-02-07 DIAGNOSIS — Z7984 Long term (current) use of oral hypoglycemic drugs: Secondary | ICD-10-CM | POA: Insufficient documentation

## 2020-02-07 DIAGNOSIS — Z7902 Long term (current) use of antithrombotics/antiplatelets: Secondary | ICD-10-CM | POA: Diagnosis not present

## 2020-02-07 DIAGNOSIS — E1169 Type 2 diabetes mellitus with other specified complication: Secondary | ICD-10-CM

## 2020-02-07 DIAGNOSIS — E119 Type 2 diabetes mellitus without complications: Secondary | ICD-10-CM | POA: Insufficient documentation

## 2020-02-07 DIAGNOSIS — Z96612 Presence of left artificial shoulder joint: Secondary | ICD-10-CM

## 2020-02-07 DIAGNOSIS — Z01811 Encounter for preprocedural respiratory examination: Secondary | ICD-10-CM

## 2020-02-07 HISTORY — PX: TOTAL SHOULDER ARTHROPLASTY: SHX126

## 2020-02-07 LAB — COMPREHENSIVE METABOLIC PANEL
ALT: 26 U/L (ref 0–44)
AST: 23 U/L (ref 15–41)
Albumin: 3.8 g/dL (ref 3.5–5.0)
Alkaline Phosphatase: 79 U/L (ref 38–126)
Anion gap: 11 (ref 5–15)
BUN: 25 mg/dL — ABNORMAL HIGH (ref 8–23)
CO2: 19 mmol/L — ABNORMAL LOW (ref 22–32)
Calcium: 8.9 mg/dL (ref 8.9–10.3)
Chloride: 106 mmol/L (ref 98–111)
Creatinine, Ser: 1.06 mg/dL (ref 0.61–1.24)
GFR calc Af Amer: >60 mL/min (ref >60)
GFR calc non Af Amer: >60 mL/min (ref >60)
Glucose, Bld: 104 mg/dL — ABNORMAL HIGH (ref 70–99)
Potassium: 4.4 mmol/L (ref 3.5–5.1)
Sodium: 136 mmol/L (ref 135–145)
Total Bilirubin: 0.5 mg/dL (ref 0.3–1.2)
Total Protein: 7.1 g/dL (ref 6.5–8.1)

## 2020-02-07 LAB — CBC WITH DIFFERENTIAL/PLATELET
Abs Immature Granulocytes: 0.03 10*3/uL (ref 0.00–0.07)
Basophils Absolute: 0.1 10*3/uL (ref 0.0–0.1)
Basophils Relative: 1 %
Eosinophils Absolute: 0.2 10*3/uL (ref 0.0–0.5)
Eosinophils Relative: 2 %
HCT: 38.7 % — ABNORMAL LOW (ref 39.0–52.0)
Hemoglobin: 12 g/dL — ABNORMAL LOW (ref 13.0–17.0)
Immature Granulocytes: 0 %
Lymphocytes Relative: 21 %
Lymphs Abs: 1.9 10*3/uL (ref 0.7–4.0)
MCH: 25.3 pg — ABNORMAL LOW (ref 26.0–34.0)
MCHC: 31 g/dL (ref 30.0–36.0)
MCV: 81.5 fL (ref 80.0–100.0)
Monocytes Absolute: 0.7 10*3/uL (ref 0.1–1.0)
Monocytes Relative: 8 %
Neutro Abs: 6.1 10*3/uL (ref 1.7–7.7)
Neutrophils Relative %: 68 %
Platelets: 306 10*3/uL (ref 150–400)
RBC: 4.75 MIL/uL (ref 4.22–5.81)
RDW: 15.5 % (ref 11.5–15.5)
WBC: 9 10*3/uL (ref 4.0–10.5)
nRBC: 0 % (ref 0.0–0.2)

## 2020-02-07 LAB — TYPE AND SCREEN
ABO/RH(D): B POS
Antibody Screen: NEGATIVE

## 2020-02-07 LAB — APTT: aPTT: 33 seconds (ref 24–36)

## 2020-02-07 LAB — PROTIME-INR
INR: 1 (ref 0.8–1.2)
Prothrombin Time: 12.9 seconds (ref 11.4–15.2)

## 2020-02-07 LAB — GLUCOSE, CAPILLARY: Glucose-Capillary: 104 mg/dL — ABNORMAL HIGH (ref 70–99)

## 2020-02-07 SURGERY — ARTHROPLASTY, SHOULDER, TOTAL
Anesthesia: General | Site: Shoulder | Laterality: Left

## 2020-02-07 MED ORDER — OXYCODONE HCL 5 MG/5ML PO SOLN: 5.0000 mg | Freq: Once | ORAL | Status: DC | PRN

## 2020-02-07 MED ORDER — SUCCINYLCHOLINE CHLORIDE 200 MG/10ML IV SOSY
PREFILLED_SYRINGE | INTRAVENOUS | Status: AC
  Filled 2020-02-07: qty 10

## 2020-02-07 MED ORDER — HYDROMORPHONE HCL 1 MG/ML IJ SOLN: 0.2500 mg | INTRAMUSCULAR | Status: DC | PRN

## 2020-02-07 MED ORDER — FENTANYL CITRATE (PF) 100 MCG/2ML IJ SOLN
INTRAMUSCULAR | Status: DC | PRN
  Administered 2020-02-07: 100 ug via INTRAVENOUS

## 2020-02-07 MED ORDER — SUGAMMADEX SODIUM 500 MG/5ML IV SOLN
INTRAVENOUS | Status: DC | PRN
  Administered 2020-02-07: 400 mg via INTRAVENOUS

## 2020-02-07 MED ORDER — ORAL CARE MOUTH RINSE: 15.0000 mL | Freq: Once | OROMUCOSAL | Status: AC

## 2020-02-07 MED ORDER — FENTANYL CITRATE (PF) 100 MCG/2ML IJ SOLN
INTRAMUSCULAR | Status: AC
  Filled 2020-02-07: qty 2

## 2020-02-07 MED ORDER — OXYCODONE HCL 5 MG PO TABS: 5.0000 mg | ORAL_TABLET | Freq: Once | ORAL | Status: DC | PRN

## 2020-02-07 MED ORDER — ONDANSETRON HCL 4 MG/2ML IJ SOLN
INTRAMUSCULAR | Status: DC | PRN
  Administered 2020-02-07: 4 mg via INTRAVENOUS

## 2020-02-07 MED ORDER — LIDOCAINE 2% (20 MG/ML) 5 ML SYRINGE
INTRAMUSCULAR | Status: DC | PRN
  Administered 2020-02-07: 60 mg via INTRAVENOUS

## 2020-02-07 MED ORDER — ONDANSETRON HCL 4 MG/2ML IJ SOLN: 4.0000 mg | Freq: Once | INTRAMUSCULAR | Status: DC | PRN

## 2020-02-07 MED ORDER — LACTATED RINGERS IV SOLN: INTRAVENOUS | Status: DC

## 2020-02-07 MED ORDER — GLYCOPYRROLATE PF 0.2 MG/ML IJ SOSY
PREFILLED_SYRINGE | INTRAMUSCULAR | Status: DC | PRN
  Administered 2020-02-07: .2 mg via INTRAVENOUS

## 2020-02-07 MED ORDER — CHLORHEXIDINE GLUCONATE 0.12 % MT SOLN
15.0000 mL | Freq: Once | OROMUCOSAL | Status: AC
  Administered 2020-02-07: 15 mL via OROMUCOSAL

## 2020-02-07 MED ORDER — DEXTROSE 5 % IV SOLN
3.0000 g | INTRAVENOUS | Status: AC
  Administered 2020-02-07: 3 g via INTRAVENOUS
  Filled 2020-02-07: qty 3

## 2020-02-07 MED ORDER — ROCURONIUM BROMIDE 10 MG/ML (PF) SYRINGE
PREFILLED_SYRINGE | INTRAVENOUS | Status: AC
  Filled 2020-02-07: qty 10

## 2020-02-07 MED ORDER — TRANEXAMIC ACID-NACL 1000-0.7 MG/100ML-% IV SOLN
1000.0000 mg | INTRAVENOUS | Status: AC
  Administered 2020-02-07: 1000 mg via INTRAVENOUS
  Filled 2020-02-07: qty 100

## 2020-02-07 MED ORDER — PHENYLEPHRINE HCL-NACL 10-0.9 MG/250ML-% IV SOLN
INTRAVENOUS | Status: DC | PRN
Start: 2020-02-07 — End: 2020-02-07
  Administered 2020-02-07: 50 ug/min via INTRAVENOUS

## 2020-02-07 MED ORDER — SODIUM CHLORIDE 0.9 % IR SOLN
Status: DC | PRN
  Administered 2020-02-07: 1000 mL

## 2020-02-07 MED ORDER — ACETAMINOPHEN 500 MG PO TABS
1000.0000 mg | ORAL_TABLET | Freq: Once | ORAL | Status: AC
  Administered 2020-02-07: 1000 mg via ORAL
  Filled 2020-02-07: qty 2

## 2020-02-07 MED ORDER — SODIUM CHLORIDE 0.9 % IV SOLN
INTRAVENOUS | Status: DC | PRN
  Administered 2020-02-07: 1000 mL

## 2020-02-07 MED ORDER — FENTANYL CITRATE (PF) 100 MCG/2ML IJ SOLN
50.0000 ug | Freq: Once | INTRAMUSCULAR | Status: AC
  Administered 2020-02-07: 50 ug via INTRAVENOUS
  Filled 2020-02-07: qty 2

## 2020-02-07 MED ORDER — ROCURONIUM BROMIDE 10 MG/ML (PF) SYRINGE
PREFILLED_SYRINGE | INTRAVENOUS | Status: DC | PRN
  Administered 2020-02-07: 70 mg via INTRAVENOUS

## 2020-02-07 MED ORDER — WATER FOR IRRIGATION, STERILE IR SOLN
Status: DC | PRN
  Administered 2020-02-07 (×2): 1000 mL

## 2020-02-07 MED ORDER — GLYCOPYRROLATE PF 0.2 MG/ML IJ SOSY
PREFILLED_SYRINGE | INTRAMUSCULAR | Status: AC
  Filled 2020-02-07: qty 1

## 2020-02-07 MED ORDER — PHENYLEPHRINE 40 MCG/ML (10ML) SYRINGE FOR IV PUSH (FOR BLOOD PRESSURE SUPPORT)
PREFILLED_SYRINGE | INTRAVENOUS | Status: AC
  Filled 2020-02-07: qty 10

## 2020-02-07 MED ORDER — PROPOFOL 10 MG/ML IV BOLUS
INTRAVENOUS | Status: DC | PRN
  Administered 2020-02-07: 200 mg via INTRAVENOUS

## 2020-02-07 MED ORDER — MIDAZOLAM HCL 2 MG/2ML IJ SOLN
1.0000 mg | INTRAMUSCULAR | Status: AC
  Administered 2020-02-07: 1 mg via INTRAVENOUS
  Filled 2020-02-07: qty 2

## 2020-02-07 MED ORDER — SUGAMMADEX SODIUM 500 MG/5ML IV SOLN
INTRAVENOUS | Status: AC
  Filled 2020-02-07: qty 5

## 2020-02-07 MED ORDER — SUCCINYLCHOLINE CHLORIDE 200 MG/10ML IV SOSY
PREFILLED_SYRINGE | INTRAVENOUS | Status: DC | PRN
  Administered 2020-02-07: 120 mg via INTRAVENOUS

## 2020-02-07 MED ORDER — TRANEXAMIC ACID-NACL 1000-0.7 MG/100ML-% IV SOLN: 1000.0000 mg | INTRAVENOUS | Status: DC

## 2020-02-07 MED ORDER — PROPOFOL 10 MG/ML IV BOLUS
INTRAVENOUS | Status: AC
  Filled 2020-02-07: qty 20

## 2020-02-07 MED ORDER — DEXAMETHASONE SODIUM PHOSPHATE 10 MG/ML IJ SOLN
INTRAMUSCULAR | Status: DC | PRN
  Administered 2020-02-07: 8 mg via INTRAVENOUS

## 2020-02-07 SURGICAL SUPPLY — 75 items
BAG ZIPLOCK 12X15 (MISCELLANEOUS) ×2 IMPLANT
BASEPLATE P2 COATD GLND 6.5X30 (Shoulder) ×1 IMPLANT
BIT DRILL 1.6MX128 (BIT) ×2 IMPLANT
BIT DRILL 2.5 DIA 127 CALI (BIT) ×2 IMPLANT
BIT DRILL 4 DIA CALIBRATED (BIT) ×2 IMPLANT
BLADE SAW SAG 73X25 THK (BLADE) ×1
BLADE SAW SGTL 73X25 THK (BLADE) ×1 IMPLANT
CLSR STERI-STRIP ANTIMIC 1/2X4 (GAUZE/BANDAGES/DRESSINGS) ×2 IMPLANT
COOLER ICEMAN CLASSIC (MISCELLANEOUS) IMPLANT
COVER BACK TABLE 60X90IN (DRAPES) ×2 IMPLANT
COVER SURGICAL LIGHT HANDLE (MISCELLANEOUS) ×2 IMPLANT
COVER WAND RF STERILE (DRAPES) IMPLANT
DRAPE INCISE IOBAN 66X45 STRL (DRAPES) ×2 IMPLANT
DRAPE ORTHO SPLIT 77X108 STRL (DRAPES)
DRAPE POUCH INSTRU U-SHP 10X18 (DRAPES) ×2 IMPLANT
DRAPE SHEET LG 3/4 BI-LAMINATE (DRAPES) ×4 IMPLANT
DRAPE SURG 17X11 SM STRL (DRAPES) ×2 IMPLANT
DRAPE SURG ORHT 6 SPLT 77X108 (DRAPES) IMPLANT
DRAPE U-SHAPE 47X51 STRL (DRAPES) ×2 IMPLANT
DRESSING AQUACEL AG SP 3.5X6 (GAUZE/BANDAGES/DRESSINGS) ×1 IMPLANT
DRSG AQUACEL AG ADV 3.5X 6 (GAUZE/BANDAGES/DRESSINGS) ×2 IMPLANT
DRSG AQUACEL AG SP 3.5X6 (GAUZE/BANDAGES/DRESSINGS) ×2
DURAPREP 26ML APPLICATOR (WOUND CARE) ×2 IMPLANT
ELECT BLADE TIP CTD 4 INCH (ELECTRODE) ×2 IMPLANT
ELECT REM PT RETURN 15FT ADLT (MISCELLANEOUS) ×2 IMPLANT
GLOVE BIO SURGEON STRL SZ7 (GLOVE) ×2 IMPLANT
GLOVE BIO SURGEON STRL SZ7.5 (GLOVE) ×2 IMPLANT
GLOVE BIOGEL PI IND STRL 7.0 (GLOVE) ×1 IMPLANT
GLOVE BIOGEL PI IND STRL 8 (GLOVE) ×1 IMPLANT
GLOVE BIOGEL PI INDICATOR 7.0 (GLOVE) ×1
GLOVE BIOGEL PI INDICATOR 8 (GLOVE) ×1
GOWN STRL REUS W/TWL LRG LVL3 (GOWN DISPOSABLE) ×4 IMPLANT
GOWN STRL REUS W/TWL XL LVL3 (GOWN DISPOSABLE) ×2 IMPLANT
HANDPIECE INTERPULSE COAX TIP (DISPOSABLE) ×1
HEMOSTAT SURGICEL 2X14 (HEMOSTASIS) ×2 IMPLANT
HOOD PEEL AWAY FLYTE STAYCOOL (MISCELLANEOUS) ×6 IMPLANT
INSERT EPOLY STND HUMERUS 32MM (Shoulder) ×2 IMPLANT
INSERT EPOLYSTD HUMERUS 32MM (Shoulder) ×1 IMPLANT
KIT BASIN (CUSTOM PROCEDURE TRAY) ×2 IMPLANT
KIT TURNOVER KIT A (KITS) IMPLANT
MANIFOLD NEPTUNE II (INSTRUMENTS) ×2 IMPLANT
NEEDLE TROCAR POINT SZ 2 1/2 (NEEDLE) ×2 IMPLANT
NS IRRIG 1000ML POUR BTL (IV SOLUTION) ×2 IMPLANT
P2 COATDE GLNOID BSEPLT 6.5X30 (Shoulder) ×2 IMPLANT
PACK SHOULDER (CUSTOM PROCEDURE TRAY) ×2 IMPLANT
PAD COLD SHLDR WRAP-ON (PAD) IMPLANT
PROTECTOR NERVE ULNAR (MISCELLANEOUS) IMPLANT
RESTRAINT HEAD UNIVERSAL NS (MISCELLANEOUS) ×2 IMPLANT
RETRIEVER SUT HEWSON (MISCELLANEOUS) ×2 IMPLANT
SCREW BONE LOCKING RSP 5.0X30 (Screw) ×4 IMPLANT
SCREW BONE RSP LOCK 5X22 (Screw) ×1 IMPLANT
SCREW BONE RSP LOCK 5X30 (Screw) ×2 IMPLANT
SCREW BONE RSP LOCKING 5.0X32 (Screw) ×2 IMPLANT
SCREW RETAIN W/HEAD 32MM (Shoulder) ×2 IMPLANT
SET HNDPC FAN SPRY TIP SCT (DISPOSABLE) ×1 IMPLANT
SLING ARM FOAM STRAP LRG (SOFTGOODS) IMPLANT
SLING ARM FOAM STRAP XLG (SOFTGOODS) ×2 IMPLANT
SLING ARM IMMOBILIZER LRG (SOFTGOODS) IMPLANT
SMARTMIX MINI TOWER (MISCELLANEOUS) ×2
SPONGE LAP 18X18 RF (DISPOSABLE) ×2 IMPLANT
STEM HUMERAL STD SHELL 10X48 (Stem) ×2 IMPLANT
STRIP CLOSURE SKIN 1/2X4 (GAUZE/BANDAGES/DRESSINGS) ×2 IMPLANT
SUCTION FRAZIER HANDLE 12FR (TUBING) ×1
SUCTION TUBE FRAZIER 12FR DISP (TUBING) ×1 IMPLANT
SUPPORT WRAP ARM LG (MISCELLANEOUS) ×2 IMPLANT
SUT ETHIBOND 2 V 37 (SUTURE) ×2 IMPLANT
SUT MNCRL AB 4-0 PS2 18 (SUTURE) ×2 IMPLANT
SUT VIC AB 2-0 CT1 27 (SUTURE) ×1
SUT VIC AB 2-0 CT1 TAPERPNT 27 (SUTURE) ×1 IMPLANT
TAPE LABRALWHITE 1.5X36 (TAPE) ×2 IMPLANT
TAPE SUT LABRALTAP WHT/BLK (SUTURE) ×2 IMPLANT
TOWEL OR 17X26 10 PK STRL BLUE (TOWEL DISPOSABLE) ×2 IMPLANT
TOWER SMARTMIX MINI (MISCELLANEOUS) ×1 IMPLANT
WATER STERILE IRR 1000ML POUR (IV SOLUTION) ×2 IMPLANT
YANKAUER SUCT BULB TIP 10FT TU (MISCELLANEOUS) ×2 IMPLANT

## 2020-02-07 NOTE — H&P (Signed)
Dustin Ayala is an 64 y.o. male.   Chief Complaint: L shoulder pain and dysfunction HPI: Endstage L shoulder arthritis with significant pain and dysfunction, failed conservative measures.  Pain interferes with sleep and quality of life.  Past Medical History:  Diagnosis Date  . Aortic atherosclerosis ([place])   . GERD (gastroesophageal reflux disease)   . Hepatic steatosis 11/15/2019  . Hepatomegaly 11/15/2019  . Hypertension   . Microcytic anemia   . Morbid obesity ([place])   . Sleep apnea    no cpap  . TIA (transient ischemic attack) 2017  . Type 2 diabetes mellitus with hyperlipidemia ([place])   . Vitamin D deficiency     Past Surgical History:  Procedure Laterality Date  . COLONOSCOPY  2010   no family history  . REPLACEMENT TOTAL KNEE Left    BACK IN 2010  . TONSILLECTOMY    . TOTAL SHOULDER REPLACEMENT Right    8.21.2018    Family History  Problem Relation Age of Onset  . COPD Mother   . Cancer Mother        basal  . Hypertension Mother   . Hyperlipidemia Mother   . Osteoporosis Mother   . Prostate cancer Father   . Cancer Father 79       stomach CA  . Diabetes Father   . Hypertension Father   . Hyperlipidemia Father   . Colon cancer Father   . Heart disease Father   . COPD Sister   . Crohn's disease Son   . Esophageal cancer Neg Hx   . Pancreatic cancer Neg Hx   . Stomach cancer Neg Hx    Social History:  reports that he has never smoked. He has never used smokeless tobacco. He reports previous alcohol use. He reports that he does not use drugs.  Allergies:  Allergies  Allergen Reactions  . Sulfamethoxazole-Trimethoprim Rash    Medications Prior to Admission  Medication Sig Dispense Refill  . atorvastatin (LIPITOR) 80 MG tablet TAKE 1 TABLET BY MOUTH EVERY DAY FOR CHOLESTEROL 90 tablet 0  . Blood Glucose Monitoring Suppl (ONETOUCH VERIO) w/Device KIT Check blood sugar 1 time daily-DX-E11.22. 1 kit 0  . carvedilol (COREG) 25 MG tablet Take 25 mg by mouth  2 (two) times daily with a meal.     . Cholecalciferol 125 MCG (5000 UT) TABS Take 5,000 Units by mouth daily.     . clopidogrel (PLAVIX) 75 MG tablet Take 1 tablet (75 mg total) by mouth daily. 90 tablet 1  . Coenzyme Q10 200 MG capsule Take 200 mg by mouth daily before breakfast.    . ezetimibe (ZETIA) 10 MG tablet Take 1 tablet (10 mg total) by mouth daily. 90 tablet 1  . furosemide (LASIX) 40 MG tablet TAKE 1 TABLET 2 X /DAY FOR BP & FLUID RETENTION / ANKLE SWELLING 180 tablet 0  . glucose blood (ONETOUCH VERIO) test strip Check blood sugar 1 time a day-E11.22. 100 each 12  . glyBURIDE (DIABETA) 2.5 MG tablet TAKE 1 TABLET DAILY WITH BREAKFAST FOR DIABETES 90 tablet 0  . metFORMIN (GLUCOPHAGE) 1000 MG tablet TAKE 1 TABLET BY MOUTH 2 TIMES DAILY (Patient taking differently: Take 1,000 mg by mouth 2 (two) times daily with a meal. ) 180 tablet 1  . omeprazole (PRILOSEC) 40 MG capsule Take 1 capsule Daily for Heart burn & Indigestion (Patient taking differently: Take 40 mg by mouth daily. ) 90 capsule 0  . OneTouch Delica Lancets 43O MISC Check blood  sugar 1 time a day-E11.22. 100 each 12  . Semaglutide, 1 MG/DOSE, (OZEMPIC, 1 MG/DOSE,) 2 MG/1.5ML SOPN Inject 1 mg into the skin once a week. Inject into the skin. (Patient taking differently: Inject 1 mg into the skin once a week. ) 6 pen 3  . Sodium Sulfate-Mag Sulfate-KCl (SUTAB) [phone] MG TABS Take 1 kit by mouth as directed. BIN: [id number] PCN: CN GROUP: [id number] MEMBER ID: [id number]; DO NOT RUN AS CASH 12 tablet 0  . topiramate (TOPAMAX) 50 MG tablet TAKE 1 TABLET BY MOUTH TWICE A DAY 30 tablet 2  . valsartan (DIOVAN) 160 MG tablet Take 1 tablet    2 x /day for BP 180 tablet 0  . vitamin B-12 (CYANOCOBALAMIN) 500 MCG tablet Take 1,000 mcg by mouth daily. Not started    . meloxicam (MOBIC) 15 MG tablet Take 1 tablet (15 mg total) by mouth as needed for pain. 90 tablet 1  . phentermine (ADIPEX-P) 37.5 MG tablet Take 1 tablet (37.5 mg total)  by mouth daily before breakfast. 30 tablet 2    Results for orders placed or performed during the hospital encounter of 02/07/20 (from the past 48 hour(s))  Glucose, capillary     Status: Abnormal   Collection Time: 02/07/20 10:18 AM  Result Value Ref Range   Glucose-Capillary 104 (H) 70 - 99 mg/dL    Comment: Glucose reference range applies only to samples taken after fasting for at least 8 hours.  APTT     Status: None   Collection Time: 02/07/20 10:35 AM  Result Value Ref Range   aPTT 33 24 - 36 seconds    Comment: Performed at [facility], [place] [address], [place], [place] [address]  Comprehensive metabolic panel     Status: Abnormal   Collection Time: 02/07/20 10:35 AM  Result Value Ref Range   Sodium 136 135 - 145 mmol/L   Potassium 4.4 3.5 - 5.1 mmol/L   Chloride 106 98 - 111 mmol/L   CO2 19 (L) 22 - 32 mmol/L   Glucose, Bld 104 (H) 70 - 99 mg/dL    Comment: Glucose reference range applies only to samples taken after fasting for at least 8 hours.   BUN 25 (H) 8 - 23 mg/dL   Creatinine, Ser 1.06 0.61 - 1.24 mg/dL   Calcium 8.9 8.9 - 10.3 mg/dL   Total Protein 7.1 6.5 - 8.1 g/dL   Albumin 3.8 3.5 - 5.0 g/dL   AST 23 15 - 41 U/L   ALT 26 0 - 44 U/L   Alkaline Phosphatase 79 38 - 126 U/L   Total Bilirubin 0.5 0.3 - 1.2 mg/dL   GFR calc non Af Amer >60 >60 mL/min   GFR calc Af Amer >60 >60 mL/min   Anion gap 11 5 - 15    Comment: Performed at [facility], [place] [address] [place], [place] [address]  Protime-INR     Status: None   Collection Time: 02/07/20 10:35 AM  Result Value Ref Range   Prothrombin Time 12.9 11.4 - 15.2 seconds   INR 1.0 0.8 - 1.2    Comment: (NOTE) INR goal varies based on device and disease states. Performed at [facility], [place] [address], [place], [place] [address]   Type and screen Order type and screen if day of surgery is less than 15 days from draw of preadmission visit or order  morning of surgery if day of surgery is greater than  6 days from preadmission visit.     Status: None (Preliminary result)   Collection Time: 02/07/20 10:35 AM  Result Value Ref Range   ABO/RH(D) PENDING    Antibody Screen PENDING    Sample Expiration      02/10/2020,2359 Performed at [facility], [place] [address] [place], [place] [address]   CBC with Differential/Platelet     Status: Abnormal   Collection Time: 02/07/20 11:00 AM  Result Value Ref Range   WBC 9.0 4.0 - 10.5 K/uL   RBC 4.75 4.22 - 5.81 MIL/uL   Hemoglobin 12.0 (L) 13.0 - 17.0 g/dL   HCT 38.7 (L) 39.0 - 52.0 %   MCV 81.5 80.0 - 100.0 fL   MCH 25.3 (L) 26.0 - 34.0 pg   MCHC 31.0 30.0 - 36.0 g/dL   RDW 15.5 11.5 - 15.5 %   Platelets 306 150 - 400 K/uL   nRBC 0.0 0.0 - 0.2 %   Neutrophils Relative % 68 %   Neutro Abs 6.1 1.7 - 7.7 K/uL   Lymphocytes Relative 21 %   Lymphs Abs 1.9 0.7 - 4.0 K/uL   Monocytes Relative 8 %   Monocytes Absolute 0.7 0.1 - 1.0 K/uL   Eosinophils Relative 2 %   Eosinophils Absolute 0.2 0.0 - 0.5 K/uL   Basophils Relative 1 %   Basophils Absolute 0.1 0.0 - 0.1 K/uL   Immature Granulocytes 0 %   Abs Immature Granulocytes 0.03 0.00 - 0.07 K/uL    Comment: Performed at [facility], [place] [address], [place], [place] [address]   No results found.  Review of Systems  All other systems reviewed and are negative.   Blood pressure (!) 162/89, pulse (!) 58, temperature 98.1 F (36.7 C), temperature source Oral, resp. rate 16, SpO2 98 %. Physical Exam  Constitutional: He is oriented to person, place, and time. He appears well-developed and well-nourished.  HENT:  Head: Atraumatic.  Eyes: EOM are normal.  Cardiovascular: Intact distal pulses.  Respiratory: Effort normal.  Musculoskeletal:     Comments: Left shoulder pain and limited ROM. NVID  Neurological: He is alert and oriented to person, place, and time.  Skin: Skin is warm and dry.   Psychiatric: He has a normal mood and affect.     Assessment/Plan Endstage L shoulder arthritis with significant pain and dysfunction, failed conservative measures.  Pain interferes with sleep and quality of life. Plan L reverse TSA Risks / benefits of surgery discussed Consent on chart  NPO for OR Preop antibiotics   Isabella Stalling, MD 02/07/2020, 11:27 AM

## 2020-02-07 NOTE — Progress Notes (Signed)
Assisted Dr Beth Finucane with left, ultrasound guided, interscalene  block. Side rails up, monitors on throughout procedure. See vital signs in flow sheet. Tolerated Procedure well.  

## 2020-02-07 NOTE — Transfer of Care (Signed)
Immediate Anesthesia Transfer of Care Note  Patient: Dustin Ayala  Procedure(s) Performed: Procedure(s): REVERSE TOTAL SHOULDER ARTHROPLASTY (Left)  Patient Location: PACU  Anesthesia Type:General  Level of Consciousness:  sedated, patient cooperative and responds to stimulation  Airway & Oxygen Therapy:Patient Spontanous Breathing and Patient connected to face mask oxgen  Post-op Assessment:  Report given to PACU RN and Post -op Vital signs reviewed and stable  Post vital signs:  Reviewed and stable  Last Vitals:  Vitals:   02/07/20 1151 02/07/20 1358  BP:  (!) 149/101  Pulse: (!) 55 (!) 57  Resp: 16 18  Temp:  36.6 C  SpO2: [id number]    Complications: No apparent anesthesia complications

## 2020-02-07 NOTE — Evaluation (Signed)
Occupational Therapy Evaluation Patient Details Name: Dustin Ayala MRN: [id number] DOB: [birth year] Today's Date: 02/07/2020    History of Present Illness Mr. Dustin Ayala is a 64 year old man who presents s/p left total shoulder replacement.   Clinical Impression   Mr. Dustin Ayala is a 64 year old man s/p L TSA  Without functional use of left dominant upper extremity secondary to decreased ROM/Strength of shoulder, interscalene block and shoulder precautions. Therapist provided education and instruction to patient and spouse in regards to exercises, precautions, positioning, donning upper extremity clothing and bathing while maintaining shoulder precautions, ice and edema management and donning/doffing sling. Patient and spouse verbalized understanding and demonstrated as needed. Patient to follow up with MD for further therapy needs.      Follow Up Recommendations  Follow surgeon's recommendation for DC plan and follow-up therapies    Equipment Recommendations  None recommended by OT    Recommendations for Other Services       Precautions / Restrictions Precautions Precautions: Shoulder Type of Shoulder Precautions: PROM to 90 deg forward flexion, 0 deg ER Shoulder Interventions: Shoulder sling/immobilizer;At all times;Off for dressing/bathing/exercises Precaution Booklet Issued: No Required Braces or Orthoses: Sling Restrictions Weight Bearing Restrictions: Yes LUE Weight Bearing: Non weight bearing      Mobility Bed Mobility                  Transfers                 General transfer comment: cga to stand for ADLs.    Balance                                           ADL either performed or assessed with clinical judgement   ADL                                         General ADL Comments: Needed assistance to donn shirt, underwear and shorts. Patient provided with education and instruction in regards to ADLs  s/p shoulder replacement     Vision         Perception     Praxis      Pertinent Vitals/Pain Pain Assessment: No/denies pain     Hand Dominance     Extremity/Trunk Assessment Upper Extremity Assessment Upper Extremity Assessment: LUE deficits/detail LUE Deficits / Details: Unable to actively move LUE secondary to interscalene block and limited by shoulder restrictions.           Communication     Cognition Arousal/Alertness: Awake/alert Behavior During Therapy: WFL for tasks assessed/performed Overall Cognitive Status: Within Functional Limits for tasks assessed                                     General Comments       Exercises     Shoulder Instructions Shoulder Instructions Donning/doffing shirt without moving shoulder: Patient able to independently direct caregiver;Caregiver independent with task Method for sponge bathing under operated UE: Patient able to independently direct caregiver;Caregiver independent with task Donning/doffing sling/immobilizer: Patient able to independently direct caregiver;Caregiver independent with task Correct positioning of sling/immobilizer: Patient able to independently direct caregiver;Caregiver independent with task ROM for elbow, wrist  and digits of operated UE: Patient able to independently direct caregiver;Caregiver independent with task Sling wearing schedule (on at all times/off for ADL's): Caregiver independent with task;Patient able to independently direct caregiver Proper positioning of operated UE when showering: Patient able to independently direct caregiver;Caregiver independent with task Dressing change: Caregiver independent with task;Patient able to independently direct caregiver Positioning of UE while sleeping: Patient able to independently direct caregiver;Caregiver independent with task    Home Living Family/patient expects to be discharged to:: Private residence Living Arrangements:  Spouse/significant other Available Help at Discharge: Family Type of Home: House                                  Prior Functioning/Environment                   OT Problem List: Impaired UE functional use;Decreased range of motion;Decreased strength      OT Treatment/Interventions:      OT Goals(Current goals can be found in the care plan section) Acute Rehab OT Goals OT Goal Formulation: All assessment and education complete, DC therapy  OT Frequency:     Barriers to D/C:            Co-evaluation              AM-PAC OT "6 Clicks" Daily Activity     Outcome Measure Help from another person eating meals?: A Little Help from another person taking care of personal grooming?: A Little Help from another person toileting, which includes using toliet, bedpan, or urinal?: A Lot Help from another person bathing (including washing, rinsing, drying)?: A Lot Help from another person to put on and taking off regular upper body clothing?: A Lot Help from another person to put on and taking off regular lower body clothing?: A Lot 6 Click Score: 14   End of Session Nurse Communication: (education complete. okay to go home.)  Activity Tolerance: Patient tolerated treatment well Patient left: in chair  OT Visit Diagnosis: Muscle weakness (generalized) (M62.81)                Time: [id number] OT Time Calculation (min): 26 min Charges:  OT General Charges $OT Visit: 1 Visit OT Evaluation $OT Eval Low Complexity: 1 Low OT Treatments $Self Care/Home Management : 8-22 mins  Derl Barrow, OTR/L Acute Care Rehab Services  Office [phone]   Lenward Chancellor 02/07/2020, 5:16 PM

## 2020-02-07 NOTE — Anesthesia Postprocedure Evaluation (Signed)
Anesthesia Post Note  Patient: Dustin Ayala  Procedure(s) Performed: REVERSE TOTAL SHOULDER ARTHROPLASTY (Left Shoulder)     Patient location during evaluation: PACU Anesthesia Type: General Level of consciousness: awake and alert and oriented Pain management: pain level controlled Vital Signs Assessment: post-procedure vital signs reviewed and stable Respiratory status: spontaneous breathing, nonlabored ventilation and respiratory function stable Cardiovascular status: blood pressure returned to baseline and stable Postop Assessment: no apparent nausea or vomiting Anesthetic complications: no    Last Vitals:  Vitals:   02/07/20 1513 02/07/20 1515  BP: (!) 147/75 (!) 147/75  Pulse: 60 60  Resp: 18   Temp: 36.7 C   SpO2: 94% 95%    Last Pain:  Vitals:   02/07/20 1515  TempSrc:   PainSc: 1                  Arman Loy A.

## 2020-02-07 NOTE — Anesthesia Preprocedure Evaluation (Addendum)
Anesthesia Evaluation  Patient identified by MRN, date of birth, ID band Patient awake    Reviewed: Allergy & Precautions, NPO status , Patient's Chart, lab work & pertinent test results, reviewed documented beta blocker date and time   Airway Mallampati: III  TM Distance: >3 FB Neck ROM: Full    Dental no notable dental hx. (+) Teeth Intact, Dental Advisory Given   Pulmonary sleep apnea ,  Non-tolerant of CPAP- sleep study >1 year ago   Pulmonary exam normal breath sounds clear to auscultation       Cardiovascular hypertension, Pt. on home beta blockers Normal cardiovascular exam Rhythm:Regular Rate:Normal     Neuro/Psych TIAnegative psych ROS   GI/Hepatic Neg liver ROS, GERD  Medicated and Controlled,  Endo/Other  diabetes, Well Controlled, Type 2, Oral Hypoglycemic AgentsMorbid obesityBMI 47  Renal/GU Renal InsufficiencyRenal diseaseCr 1.52  negative genitourinary   Musculoskeletal  (+) Arthritis , Osteoarthritis,  L shoulder OA   Abdominal (+) + obese,   Peds  Hematology negative hematology ROS (+)   Anesthesia Other Findings   Reproductive/Obstetrics negative OB ROS                            Anesthesia Physical Anesthesia Plan  ASA: III  Anesthesia Plan: General and Regional   Post-op Pain Management:  Regional for Post-op pain   Induction: Intravenous  PONV Risk Score and Plan: 3 and Ondansetron, Dexamethasone, Midazolam and Treatment may vary due to age or medical condition  Airway Management Planned: Oral ETT and Video Laryngoscope Planned  Additional Equipment: None  Intra-op Plan:   Post-operative Plan: Extubation in OR  Informed Consent: I have reviewed the patients History and Physical, chart, labs and discussed the procedure including the risks, benefits and alternatives for the proposed anesthesia with the patient or authorized representative who has indicated  his/her understanding and acceptance.     Dental advisory given  Plan Discussed with: CRNA  Anesthesia Plan Comments:        Anesthesia Quick Evaluation

## 2020-02-07 NOTE — Op Note (Signed)
Procedure(s): REVERSE TOTAL SHOULDER ARTHROPLASTY Procedure Note  Zaeden Selders male 64 y.o. 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 male  With endstage left shoulder arthritis with advanced rotator cuff disease. Pain and dysfunction interfered with quality of life and nonoperative treatment with activity modification, NSAIDS and injections failed.     Surgeon: Isabella Stalling   Assistants: Jeanmarie Hubert PA-C [facility] was present and scrubbed throughout the procedure and was essential in positioning, retraction, exposure, and closure)  Anesthesia: General endotracheal anesthesia   Procedure Detail  REVERSE TOTAL SHOULDER ARTHROPLASTY   Estimated Blood Loss:  200 mL         Drains: none  Blood Given: none          Specimens: none        Complications:  * No complications entered in OR log *         Disposition: PACU - hemodynamically stable.         Condition: stable      OPERATIVE FINDINGS:  A DJO Altivate pressfit reverse total shoulder arthroplasty was placed with a  size 10 stem, a 32 standard glenosphere, and a standard poly insert. The base plate  fixation was excellent.  PROCEDURE: The patient was identified in the preoperative holding area  where I personally marked the operative site after verifying site, side,  and procedure with the patient. An interscalene block given by  the attending anesthesiologist in the holding area and the patient was taken back to the operating room where all extremities were  carefully padded in position after general anesthesia was induced. She  was placed in a beach-chair position and the operative upper extremity was  prepped and draped in a standard sterile fashion. An approximately 10-  cm  incision was made from the tip of the coracoid process to the center  point of the humerus at the level of the axilla. Dissection was carried  down through subcutaneous tissues to the level of the cephalic vein  which was taken laterally with the deltoid. The pectoralis major was  retracted medially. The subdeltoid space was developed and the lateral  edge of the conjoined tendon was identified. The undersurface of  conjoined tendon was palpated and the musculocutaneous nerve was not in  the field. Retractor was placed underneath the conjoined and second  retractor was placed lateral into the deltoid. The circumflex humeral  artery and vessels were identified and clamped and coagulated. The  biceps tendon was tenodesed to the upper border of the pectoralis major.  The subscapularis was taken down as a peel with the underlying capsule.  The  joint was then gently externally rotated while the capsule was released  from the humeral neck around to just beyond the 6 o'clock position. At  this point, the joint was dislocated and the humeral head was presented  into the wound. The excessive osteophyte formation was removed with a  large rongeur.  The cutting guide was used to make the appropriate  head cut and the head was saved for potentially bone grafting.  The glenoid was exposed with the arm in an  abducted extended position. The anterior and posterior labrum were  completely excised and the capsule was released circumferentially to  allow for exposure of the glenoid for preparation. The  2.5 mm drill was  placed using the guide in 5-10 inferior angulation and the tap was then advanced in the same hole. Small and large reamers were then used. The tap was then removed and the Metaglene was then screwed in with excellent purchase.  The peripheral guide was then used to drilled measured and filled peripheral locking screws. The size 32 standard glenosphere was then impacted on the [facility] taper and the  central screw was placed. The humerus was then again exposed and the diaphyseal reamers were used followed by the metaphyseal reamers. The final broach was left in place in the proximal trial was placed. The joint was reduced and with this implant it was felt that soft tissue tensioning was appropriate with excellent stability and excellent range of motion. Therefore, final humeral stem was placed  Pressfit.  And then the trial polyethylene inserts were tested again and the above implant was felt to be the most appropriate for final insertion. The joint was reduced taken through full range of motion and felt to be stable. Soft tissue tension was appropriate.  The joint was then copiously irrigated with pulse  lavage and the wound was then closed. The subscapularis repaired with suture tapes through bone tunnels around the implant.  Skin was closed with 2-0 Vicryl in a deep dermal layer and 4-0  Monocryl for skin closure. Steri-Strips were applied. Sterile  dressings were then applied as well as a sling. The patient was allowed  to awaken from general anesthesia, transferred to stretcher, and taken  to recovery room in stable condition.   POSTOPERATIVE PLAN: The patient will be observed in the recovery room and if his pain is well controlled and he is recovering well from anesthesia he could be discharged home today with family.

## 2020-02-08 ENCOUNTER — Encounter: Payer: Self-pay | Admitting: *Deleted

## 2020-02-13 ENCOUNTER — Encounter: Payer: Self-pay | Admitting: Anesthesiology

## 2020-02-13 NOTE — Addendum Note (Signed)
Addendum  created 02/13/20 1015 by Pervis Hocking, DO   Attestation recorded in [place], Child order released for a procedure order, Clinical Note Signed, Intraprocedure Attestations filed, Solicitor edited

## 2020-02-13 NOTE — Anesthesia Procedure Notes (Signed)
Anesthesia Regional Block: Interscalene brachial plexus block   Pre-Anesthetic Checklist: ,, timeout performed, Correct Patient, Correct Site, Correct Laterality, Correct Procedure, Correct Position, site marked, Risks and benefits discussed,  Surgical consent,  Pre-op evaluation,  At surgeon's request and post-op pain management  Laterality: Left  Prep: Maximum Sterile Barrier Precautions used, chloraprep       Needles:  Injection technique: Single-shot  Needle Type: Echogenic Stimulator Needle     Needle Length: 9cm  Needle Gauge: 22     Additional Needles:   Procedures:,,,, ultrasound used (permanent image in chart),,,,  Narrative:  Start time: 02/07/2020 12:30 PM End time: 02/07/2020 12:40 PM Injection made incrementally with aspirations every 5 mL.  Performed by: Personally  Anesthesiologist: Pervis Hocking, DO  Additional Notes: Monitors applied. No increased pain on injection. No increased resistance to injection. Injection made in 5cc increments. Good needle visualization. Patient tolerated procedure well.

## 2020-02-19 ENCOUNTER — Other Ambulatory Visit: Payer: Self-pay | Admitting: Physician Assistant

## 2020-02-21 ENCOUNTER — Other Ambulatory Visit: Payer: Self-pay | Admitting: Internal Medicine

## 2020-02-21 DIAGNOSIS — G4730 Sleep apnea, unspecified: Secondary | ICD-10-CM

## 2020-02-26 ENCOUNTER — Telehealth: Payer: Self-pay | Admitting: Physician Assistant

## 2020-02-26 NOTE — Telephone Encounter (Signed)
Got a message from the front desk that the patient is having some irregular breathing. Thinks it is from a new medication he is on.  He had a total reverse shoulder surgery on 02/07/2020.  I called and left a message for him to call back but also reminded him that if he has any worsening SOB, chest pain that he had a major surgery and should go to the ER to get evaluated for PE, pneumo, CHF, etc.

## 2020-02-27 NOTE — Telephone Encounter (Signed)
States having irregular breathing after his surgery, feels like he will have to work harder to get a breath. He also recently started topamax and phentermine- will stop those.  Nothing triggers it. Can be with sitting, not exertional. No chest pain, no fever- has been colder than usual.  He states his weight is down but his legs are swelling.   Will stop the topamax and phentermine. If not improving will come in for labs, exam and possible CXR but from symptoms, low probability CAD, PE, CHF, etc.

## 2020-02-27 NOTE — Addendum Note (Signed)
Addended by: Vicie Mutters R on: 02/27/2020 11:40 AM   Modules accepted: Orders

## 2020-03-22 ENCOUNTER — Other Ambulatory Visit: Payer: Self-pay | Admitting: Physician Assistant

## 2020-04-02 NOTE — Progress Notes (Addendum)
FOLLOW UP Assessment and Plan:  Acquired Thrombophilia ([place]) -     CBC with Differential/Platelet MONITOR- patient on plavix  Aortic atherosclerosis (HCC) Control blood pressure, cholesterol, glucose, increase exercise.   Morbid obesity ([place]) ADD BACK PHENTERMINE. MAY SWITCH TO WELLBUTRIN CONTINUE OZEMPIC CONTINUE TO CUT BACK ON PORTION, EATING OUT, AND ADD IN EXERCISE.  DISCUSSED SURGERY - follow up 3 months for progress monitoring - increase veggies, decrease carbs - long discussion about weight loss, diet, and exercise  CKD stage 2 due to type 2 diabetes mellitus (HCC) -     COMPLETE METABOLIC PANEL WITH GFR -     Hemoglobin A1c  Type 2 diabetes mellitus with hyperlipidemia (HCC) -     Lipid panel -     Hemoglobin A1c Discussed general issues about diabetes pathophysiology and management., Educational material distributed., Suggested low cholesterol diet., Encouraged aerobic exercise., Discussed foot care., Reminded to get yearly retinal exam. CONTINUE OZEMPIC, CONTINUE WEIGHT LOSS  Essential hypertension -     COMPLETE METABOLIC PANEL WITH GFR -     TSH PATIENT COULD NOT GIVE URINE AT CPE, GET THIS OV - continue medications, DASH diet, exercise and monitor at home. Call if greater than 130/80.   Vitamin D deficiency -     VITAMIN D 25 Hydroxy (Vit-D Deficiency, Fractures)  Medication management -     Magnesium  BMI 45.0-49.9, adult (HCC) - follow up 3 months for progress monitoring - increase veggies, decrease carbs - long discussion about weight loss, diet, and exercise  OSA (obstructive sleep apnea) Long discussion about how it is harder to manage his chronic co morbidities if he is not treating OSA Continue weight loss  Venous insufficiency Has improved with weight loss, elevation.  Suggest getting on CPAP/Bipap No SOB, no CP  TIA (transient ischemic attack) Continue plavix Control blood pressure, cholesterol, glucose, increase exercise.   Discussed  med's effects and SE's. Screening labs and tests as requested with regular follow-up as recommended. Over 40 minutes of exam, counseling, chart review and critical decision making was performed  HPI Patient presents for follow up for HTN, chol, DM, morbid obesity, OSA, venous insuff.   He moved her from [place] in 2019 to be closer to his daughter, Mariam Dollar.  He had a work up for a possible TIA in 2019, normal echo, normal MRI brain, he is on plavix due to this.  He has chronic edema due to his morbid obesity, this had improved with ozempic and weight loss but has come back  Ct showed hepatomegaly and fatty liver.  He did have a positive fecal occult blood and is had a colonoscopy this year.   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 Allyn Kenner [phone]  BMI is Body mass index is 46.2 kg/m., he is working on diet and exercise.  He has OSA and has tried CPAP and BiPAP, has not been able to have one due to feeling of suffocation.   His blood pressure has been controlled at home, today their BP is BP: (!) 132/80 He does not workout. He denies chest pain, shortness of breath, dizziness.  Wt Readings from Last 5 Encounters:  04/07/20 (!) 322 lb (146.1 kg)  02/04/20 (!) 330 lb (149.7 kg)  02/01/20 (!) 334 lb (151.5 kg)  01/18/20 (!) 334 lb (151.5 kg)  01/16/20 (!) 332 lb (150.6 kg)    He is on cholesterol medication and denies myalgias. 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  glyBURIDE (DIABETA) 2.5 MG tablet, TAKE 1 TABLET DAILY WITH BREAKFAST FOR DIABETES .  metFORMIN (GLUCOPHAGE) 1000 MG tablet, TAKE 1 TABLET BY MOUTH 2 TIMES DAILY (Patient taking differently: Take 1,000 mg by mouth 2 (two) times daily with a meal. ) .  Semaglutide, 1 MG/DOSE, (OZEMPIC, 1 MG/DOSE,) 2 MG/1.5ML SOPN, Inject 1 mg into the skin once a week. Inject into the skin. (Patient taking differently: Inject 1 mg into the skin once a week. )  Current Outpatient Medications (Cardiovascular):  .  atorvastatin (LIPITOR) 80 MG tablet, TAKE 1 TABLET BY MOUTH EVERY DAY FOR CHOLESTEROL .  carvedilol (COREG) 25 MG tablet, Take 25 mg by mouth 2 (two) times daily with a meal.  .  ezetimibe (ZETIA) 10 MG tablet, TAKE 1 TABLET BY MOUTH EVERY DAY .  furosemide (LASIX) 40 MG tablet, TAKE 1 TABLET 2 X /DAY FOR BP & FLUID RETENTION / ANKLE SWELLING .  valsartan (DIOVAN) 160 MG tablet, Take 1 tablet    2 x /day for BP    Current Outpatient Medications (Hematological):  .  clopidogrel (PLAVIX) 75 MG tablet, Take 1 tablet (75 mg total) by mouth daily. .  vitamin B-12 (CYANOCOBALAMIN) 500 MCG tablet, Take 1,000 mcg by mouth daily. Not started  Current Outpatient Medications (Other):  .  Blood Glucose Monitoring Suppl (ONETOUCH VERIO) w/Device KIT, Check blood sugar 1 time daily-DX-E11.22. . Marland KitchenCholecalciferol 125 MCG (5000 UT) TABS, Take 5,000 Units by mouth daily.  .  Coenzyme Q10 200 MG capsule, Take 200 mg by mouth daily before breakfast. .  glucose blood (ONETOUCH VERIO) test strip, Check blood sugar 1 time a day-E11.22. . Marland Kitchenomeprazole (PRILOSEC) 40 MG capsule, TAKE 1 CAPSULE DAILY FOR HEART BURN & INDIGESTION .  OneTouch Delica Lancets [id number], Check blood sugar 1 time a day-E11.22. . Marland KitchenSodium Sulfate-Mag Sulfate-KCl (SUTAB) 14952-612-9868G TABS, Take 1 kit by  mouth as directed. BIN: [id number]: CN GROUP: [id number] ID: [id number] NOT RUN AS CASH  Allergies:  Allergies  Allergen Reactions  . Sulfamethoxazole-Trimethoprim Rash   Medical History:  has Morbid obesity ([facility] Type 2 diabetes mellitus with hyperlipidemia ([place] Vitamin D deficiency; GERD (gastroesophageal reflux disease); OSA (obstructive sleep apnea); Hypertension; CKD stage 2 due to type 2 diabetes mellitus ([facility] Medication management; Venous insufficiency; Fecal occult blood test positive; Microcytic anemia; Aortic atherosclerosis ([facility] Hepatic steatosis; Hepatomegaly; BMI 45.0-49.9, adult ([facility] TIA (transient ischemic attack); and Thrombophilia ([facility] their problem list.  Surgical History: reviewed and unchanged Family History: reviewed and unchanged Social History: reviewed and unchanged  Review of Systems:  Review of Systems  Constitutional: Negative.   HENT: Negative.   Eyes: Negative.   Respiratory: Positive for shortness of breath (with walking  due to body habitus). Negative for cough, hemoptysis, sputum production and wheezing.   Cardiovascular: Positive for leg swelling (improved). Negative for chest pain, palpitations, orthopnea and PND.  Gastrointestinal: Negative for abdominal pain, blood in stool, constipation, diarrhea, heartburn (has hiatal hernia), melena, nausea and vomiting.  Genitourinary: Negative.   Musculoskeletal: Positive for back pain, joint pain and myalgias. Negative for falls and neck pain.  Skin: Positive for rash.  Neurological: Negative.   Endo/Heme/Allergies: Negative.   Psychiatric/Behavioral: Negative.     Physical Exam: Estimated body mass index is 46.2 kg/m as calculated from the following:   Height as of 02/04/20: 5' 10" (1.778 m).   Weight as of this encounter: 322 lb (146.1 kg). BP (!) 132/80   Pulse 64   Temp 98.1 F (36.7 C)   Resp 12   Wt (!) 322 lb (146.1 kg)   SpO2 98%   BMI 46.20 kg/m  General Appearance:  Obese WM, in no apparent distress.  Eyes: PERRLA, EOMs, conjunctiva no swelling or erythema, normal fundi and vessels.  Sinuses: No Frontal/maxillary tenderness  ENT/Mouth: Ext aud canals clear, normal light reflex with TMs without erythema, bulging. Mouth and nose not examined- patient wearing a facemask. Hearing normal.  Neck: Supple, thyroid normal. No bruits  Respiratory: Respiratory effort normal, BS decreased due to body habitus but equal bilaterally without rales, rhonchi, wheezing or stridor.  Cardio: RRR without murmurs, rubs or gallops. Brisk DP 2+ but 1+  TP, 1+ edema with improved venous stasis bilateral legs, no warmth, no erythema, no hard cord.  Chest: symmetric, with normal excursions and percussion.  Abdomen: Soft, obese, nontender, no guarding, rebound, hernias, masses, or organomegaly.  Lymphatics: Non tender without lymphadenopathy.  Musculoskeletal: Full ROM all peripheral extremities,antalgic gait, decreased movement right shoulder due to pain.  Skin: Warm, dry. Neuro: Cranial nerves intact, reflexes equal bilaterally. Normal muscle tone, no cerebellar symptoms. Sensation intact bilateral feet without ulcers. Marland Kitchen  Psych: Awake and oriented X 3, normal affect, Insight and Judgment appropriate.    Vicie Mutters 10:47 AM [facility]

## 2020-04-07 ENCOUNTER — Ambulatory Visit: Payer: 59 | Admitting: Physician Assistant

## 2020-04-07 ENCOUNTER — Other Ambulatory Visit: Payer: Self-pay

## 2020-04-07 VITALS — BP 132/80 | HR 64 | Temp 98.1°F | Resp 12 | Wt 322.0 lb

## 2020-04-07 DIAGNOSIS — E1169 Type 2 diabetes mellitus with other specified complication: Secondary | ICD-10-CM | POA: Diagnosis not present

## 2020-04-07 DIAGNOSIS — E1122 Type 2 diabetes mellitus with diabetic chronic kidney disease: Secondary | ICD-10-CM

## 2020-04-07 DIAGNOSIS — Z79899 Other long term (current) drug therapy: Secondary | ICD-10-CM

## 2020-04-07 DIAGNOSIS — I10 Essential (primary) hypertension: Secondary | ICD-10-CM

## 2020-04-07 DIAGNOSIS — I700 Atherosclerosis of aorta: Secondary | ICD-10-CM

## 2020-04-07 DIAGNOSIS — N182 Chronic kidney disease, stage 2 (mild): Secondary | ICD-10-CM

## 2020-04-07 DIAGNOSIS — E785 Hyperlipidemia, unspecified: Secondary | ICD-10-CM

## 2020-04-07 DIAGNOSIS — E559 Vitamin D deficiency, unspecified: Secondary | ICD-10-CM

## 2020-04-07 DIAGNOSIS — D6859 Other primary thrombophilia: Secondary | ICD-10-CM

## 2020-04-07 MED ORDER — PHENTERMINE HCL 37.5 MG PO TABS: 37.5000 mg | ORAL_TABLET | Freq: Every day | ORAL | 2 refills | Status: DC

## 2020-04-07 NOTE — Patient Instructions (Signed)
Dining out: help on how to choose  It is better if you can meal plan and eat at your house but sometimes life happens so here are some tips to help you make healthier choices while eating out:  1) Ask for your server to pack up 1/2 of your meal to take home for lunch or later. Portion sizes are huge so if you don't have all of it in front of you, you are less likely to eat it all.    2) Ask if they have a smaller portion or lunch portion available, this is often cheaper as well!  3) Ask to not have the bread basket brought to the table  4) Ask for the dressing on the side.   5) Look at the nutrition menu BEFORE you go to a restaurant or fast food chain and have what you will eat in mind. You can also look it up on food tracking ups like Myfitness Pal or Lost it. However the web site for the restaurant is usually the best bet.   6) Don't forget that you are the costumer, it is okay to ask them to leave things out, ask about substituting, or ask them to cook with less oil!  Here is some more general information for you!

## 2020-04-08 ENCOUNTER — Other Ambulatory Visit: Payer: Self-pay | Admitting: Physician Assistant

## 2020-04-08 LAB — LIPID PANEL
Cholesterol: 155 mg/dL (ref <200)
HDL: 37 mg/dL — ABNORMAL LOW (ref >=40)
LDL Cholesterol (Calc): 85 mg/dL (calc)
Non-HDL Cholesterol (Calc): 118 mg/dL (calc) (ref <130)
Total CHOL/HDL Ratio: 4.2 (calc) (ref <5.0)
Triglycerides: 239 mg/dL — ABNORMAL HIGH (ref <150)

## 2020-04-08 LAB — COMPLETE METABOLIC PANEL WITH GFR
AG Ratio: 1.4 (calc) (ref 1.0–2.5)
ALT: 21 U/L (ref 9–46)
AST: 20 U/L (ref 10–35)
Albumin: 4.1 g/dL (ref 3.6–5.1)
Alkaline phosphatase (APISO): 100 U/L (ref 35–144)
BUN: 22 mg/dL (ref 7–25)
CO2: 23 mmol/L (ref 20–32)
Calcium: 9.4 mg/dL (ref 8.6–10.3)
Chloride: 105 mmol/L (ref 98–110)
Creat: 1.11 mg/dL (ref 0.70–1.25)
GFR, Est African American: 81 mL/min/{1.73_m2} (ref >=60)
GFR, Est Non African American: 70 mL/min/{1.73_m2} (ref >=60)
Globulin: 3 g/dL (calc) (ref 1.9–3.7)
Glucose, Bld: 98 mg/dL (ref 65–99)
Potassium: 4.6 mmol/L (ref 3.5–5.3)
Sodium: 137 mmol/L (ref 135–146)
Total Bilirubin: 0.4 mg/dL (ref 0.2–1.2)
Total Protein: 7.1 g/dL (ref 6.1–8.1)

## 2020-04-08 LAB — CBC WITH DIFFERENTIAL/PLATELET
Absolute Monocytes: 725 cells/uL (ref 200–950)
Basophils Absolute: 53 cells/uL (ref 0–200)
Basophils Relative: 0.5 %
Eosinophils Absolute: 252 cells/uL (ref 15–500)
Eosinophils Relative: 2.4 %
HCT: 37.4 % — ABNORMAL LOW (ref 38.5–50.0)
Hemoglobin: 12.1 g/dL — ABNORMAL LOW (ref 13.2–17.1)
Lymphs Abs: 2300 cells/uL (ref 850–3900)
MCH: 25.7 pg — ABNORMAL LOW (ref 27.0–33.0)
MCHC: 32.4 g/dL (ref 32.0–36.0)
MCV: 79.4 fL — ABNORMAL LOW (ref 80.0–100.0)
MPV: 9.1 fL (ref 7.5–12.5)
Monocytes Relative: 6.9 %
Neutro Abs: 7172 cells/uL (ref 1500–7800)
Neutrophils Relative %: 68.3 %
Platelets: 356 10*3/uL (ref 140–400)
RBC: 4.71 10*6/uL (ref 4.20–5.80)
RDW: 15.7 % — ABNORMAL HIGH (ref 11.0–15.0)
Total Lymphocyte: 21.9 %
WBC: 10.5 10*3/uL (ref 3.8–10.8)

## 2020-04-08 LAB — URINALYSIS, ROUTINE W REFLEX MICROSCOPIC
Bacteria, UA: NONE SEEN /HPF
Bilirubin Urine: NEGATIVE
Glucose, UA: NEGATIVE
Hgb urine dipstick: NEGATIVE
Hyaline Cast: NONE SEEN /LPF
Ketones, ur: NEGATIVE
Leukocytes,Ua: NEGATIVE
Nitrite: NEGATIVE
Protein, ur: NEGATIVE
RBC / HPF: NONE SEEN /HPF (ref 0–2)
Specific Gravity, Urine: 1.025 (ref 1.001–1.03)
Squamous Epithelial / HPF: NONE SEEN /HPF (ref <=5)
WBC, UA: NONE SEEN /HPF (ref 0–5)
pH: <=5 (ref 5.0–8.0)

## 2020-04-08 LAB — HEMOGLOBIN A1C
Hgb A1c MFr Bld: 6.3 % of total Hgb — ABNORMAL HIGH (ref <5.7)
Mean Plasma Glucose: 134 (calc)
eAG (mmol/L): 7.4 (calc)

## 2020-04-08 LAB — MICROALBUMIN / CREATININE URINE RATIO
Creatinine, Urine: 186 mg/dL (ref 20–320)
Microalb Creat Ratio: 1 mcg/mg creat (ref <30)
Microalb, Ur: 0.2 mg/dL

## 2020-04-08 LAB — MAGNESIUM: Magnesium: 1.9 mg/dL (ref 1.5–2.5)

## 2020-04-08 LAB — VITAMIN D 25 HYDROXY (VIT D DEFICIENCY, FRACTURES): Vit D, 25-Hydroxy: 30 ng/mL (ref 30–100)

## 2020-04-08 LAB — TSH: TSH: 1.09 mIU/L (ref 0.40–4.50)

## 2020-04-16 ENCOUNTER — Telehealth: Payer: Self-pay

## 2020-04-16 NOTE — Telephone Encounter (Signed)
°-----   Message from Algernon Huxley, RN sent at 01/21/2020  1:20 PM EDT ----- Regarding: Labs Pt needs labs, orders in epic.

## 2020-04-16 NOTE — Telephone Encounter (Signed)
-----   Message from Algernon Huxley, RN sent at 01/21/2020  1:20 PM EDT ----- Regarding: Labs Pt needs labs, orders in epic.

## 2020-04-16 NOTE — Telephone Encounter (Signed)
Spoke with patient to remind him that he is due for repeat labs, pt recently had labs drawn at PCP office, they drew a CBC w/ diff, no IBC + Ferritin. Would you still like patient to come in for IBC + Ferritin? See lab result from 04/08/20.Thank you

## 2020-04-21 NOTE — Telephone Encounter (Signed)
Yes he does need IBC+ferritin Thanks

## 2020-04-22 NOTE — Telephone Encounter (Signed)
Spoke with patient, he is aware that he needs to come in for iron studies (IBC + Ferritin), pt states that his PCP was handling this and I advised him that they have only run a CBC. Pt stated that he would do this one last time but he doesn't want to follow up with this at both offices. Provided patient the address, pt advised that lab is open between 7:30 - 5, M-F, no appt necessary.

## 2020-04-25 ENCOUNTER — Other Ambulatory Visit: Payer: Self-pay | Admitting: Physician Assistant

## 2020-04-27 ENCOUNTER — Other Ambulatory Visit: Payer: Self-pay | Admitting: Internal Medicine

## 2020-04-27 DIAGNOSIS — I872 Venous insufficiency (chronic) (peripheral): Secondary | ICD-10-CM

## 2020-04-27 DIAGNOSIS — N182 Chronic kidney disease, stage 2 (mild): Secondary | ICD-10-CM

## 2020-04-27 DIAGNOSIS — Z794 Long term (current) use of insulin: Secondary | ICD-10-CM

## 2020-04-27 DIAGNOSIS — E0822 Diabetes mellitus due to underlying condition with diabetic chronic kidney disease: Secondary | ICD-10-CM

## 2020-04-27 DIAGNOSIS — I10 Essential (primary) hypertension: Secondary | ICD-10-CM

## 2020-04-30 ENCOUNTER — Other Ambulatory Visit: Payer: Self-pay | Admitting: Adult Health

## 2020-04-30 DIAGNOSIS — E785 Hyperlipidemia, unspecified: Secondary | ICD-10-CM

## 2020-04-30 DIAGNOSIS — E1169 Type 2 diabetes mellitus with other specified complication: Secondary | ICD-10-CM

## 2020-05-05 ENCOUNTER — Other Ambulatory Visit (INDEPENDENT_AMBULATORY_CARE_PROVIDER_SITE_OTHER): Payer: 59

## 2020-05-05 ENCOUNTER — Other Ambulatory Visit: Payer: Self-pay

## 2020-05-05 DIAGNOSIS — D509 Iron deficiency anemia, unspecified: Secondary | ICD-10-CM

## 2020-05-05 LAB — IBC + FERRITIN
Ferritin: 44.7 ng/mL (ref 22.0–322.0)
Iron: 108 ug/dL (ref 42–165)
Saturation Ratios: 25.5 % (ref 20.0–50.0)
Transferrin: 303 mg/dL (ref 212.0–360.0)

## 2020-05-15 ENCOUNTER — Other Ambulatory Visit: Payer: Self-pay | Admitting: Physician Assistant

## 2020-05-15 DIAGNOSIS — G4730 Sleep apnea, unspecified: Secondary | ICD-10-CM

## 2020-05-20 ENCOUNTER — Telehealth: Payer: Self-pay | Admitting: Internal Medicine

## 2020-05-20 NOTE — Telephone Encounter (Signed)
Patient called is in a lot of pain and has not been able to have a BM for over a week now please advise is seeking help

## 2020-05-20 NOTE — Telephone Encounter (Signed)
Spoke with patient, he states that his last BM was about 5 days ago, pt states that yesterday he took 3 stool softeners, he had a BM after calling office. Pt advised to do Miralax one dose daily to keep bowels regular. Pt verbalized understanding.

## 2020-06-20 ENCOUNTER — Other Ambulatory Visit: Payer: Self-pay | Admitting: Physician Assistant

## 2020-06-20 DIAGNOSIS — G8929 Other chronic pain: Secondary | ICD-10-CM

## 2020-06-20 DIAGNOSIS — N182 Chronic kidney disease, stage 2 (mild): Secondary | ICD-10-CM

## 2020-06-20 DIAGNOSIS — E0822 Diabetes mellitus due to underlying condition with diabetic chronic kidney disease: Secondary | ICD-10-CM

## 2020-06-20 DIAGNOSIS — E1169 Type 2 diabetes mellitus with other specified complication: Secondary | ICD-10-CM

## 2020-06-20 DIAGNOSIS — M25511 Pain in right shoulder: Secondary | ICD-10-CM

## 2020-06-20 DIAGNOSIS — Z794 Long term (current) use of insulin: Secondary | ICD-10-CM

## 2020-07-16 ENCOUNTER — Ambulatory Visit: Payer: 59 | Admitting: Adult Health Nurse Practitioner

## 2020-07-16 ENCOUNTER — Other Ambulatory Visit: Payer: Self-pay

## 2020-07-16 ENCOUNTER — Encounter: Payer: Self-pay | Admitting: Adult Health Nurse Practitioner

## 2020-07-16 VITALS — BP 110/80 | HR 68 | Temp 97.6°F | Wt 309.0 lb

## 2020-07-16 DIAGNOSIS — G4733 Obstructive sleep apnea (adult) (pediatric): Secondary | ICD-10-CM

## 2020-07-16 DIAGNOSIS — I872 Venous insufficiency (chronic) (peripheral): Secondary | ICD-10-CM

## 2020-07-16 DIAGNOSIS — Z79899 Other long term (current) drug therapy: Secondary | ICD-10-CM

## 2020-07-16 DIAGNOSIS — I700 Atherosclerosis of aorta: Secondary | ICD-10-CM

## 2020-07-16 DIAGNOSIS — E0822 Diabetes mellitus due to underlying condition with diabetic chronic kidney disease: Secondary | ICD-10-CM

## 2020-07-16 DIAGNOSIS — E785 Hyperlipidemia, unspecified: Secondary | ICD-10-CM | POA: Diagnosis not present

## 2020-07-16 DIAGNOSIS — H6121 Impacted cerumen, right ear: Secondary | ICD-10-CM | POA: Diagnosis not present

## 2020-07-16 DIAGNOSIS — E1169 Type 2 diabetes mellitus with other specified complication: Secondary | ICD-10-CM | POA: Diagnosis not present

## 2020-07-16 DIAGNOSIS — D6859 Other primary thrombophilia: Secondary | ICD-10-CM | POA: Diagnosis not present

## 2020-07-16 DIAGNOSIS — H9201 Otalgia, right ear: Secondary | ICD-10-CM | POA: Diagnosis not present

## 2020-07-16 DIAGNOSIS — Z794 Long term (current) use of insulin: Secondary | ICD-10-CM

## 2020-07-16 DIAGNOSIS — E1122 Type 2 diabetes mellitus with diabetic chronic kidney disease: Secondary | ICD-10-CM

## 2020-07-16 DIAGNOSIS — E559 Vitamin D deficiency, unspecified: Secondary | ICD-10-CM

## 2020-07-16 DIAGNOSIS — N182 Chronic kidney disease, stage 2 (mild): Secondary | ICD-10-CM

## 2020-07-16 DIAGNOSIS — Z8673 Personal history of transient ischemic attack (TIA), and cerebral infarction without residual deficits: Secondary | ICD-10-CM

## 2020-07-16 DIAGNOSIS — I10 Essential (primary) hypertension: Secondary | ICD-10-CM

## 2020-07-16 MED ORDER — VALSARTAN 160 MG PO TABS: ORAL_TABLET | ORAL | 0 refills | Status: DC

## 2020-07-16 MED ORDER — FUROSEMIDE 40 MG PO TABS: ORAL_TABLET | ORAL | 0 refills | Status: DC

## 2020-07-16 NOTE — Patient Instructions (Addendum)
Change the way you are taking the Valsaratan 160mg , take one tablet ONCE daily.  Check you blood pressure twice a day and record on the paper provided.  Check first thing in the morning before taking your blood pressure medication.  About 70min to 1 hours after taking medication is the result/affect of the medication on your blood pressure.  We DO NOT want your blood pressure lower than 110/65.  120/80 is a perfect blood pressure.  We do not want it higher than 130/89.  Call the office OR send me a MyChart message letting me know some of your readings.  AND be aware of how you feel after taking your medication.   Ear care: Both of your ears are clear of wax at this time Use 2-3 drops of oil for next 3-4 nights.  This will help to sooth your ear canals.  Use cotton ball to keep in ears while sleeping.  For regular maintenance: Use warm or room temp peroxide in ear once every 1-2 weeks, then apply oil that night x1.   Next morning let warm water from shower run into your ears.   This will help flush out any wax Do not use cotton swabs in your ears Should you have pain, decrease in your ability to hear or dizziness please contact the office for an appointment.    As you continue with weight loss and improving your health your need for blood pressure and the cholesterol and diabetes medications may decrease meaning we can cut back.   Work on increasing activity, even creative means if your knees are bothering you.  You can check into swimming, short walks or even chair exercises to help.    Here are some simple exercises I like you can do from a chair.  This is great if you are not able to get outdoors or restricted by your breathing.  Step-By-Step Chair Exercises  To maximize your chair exercise routine, you'll need a set of small dumbbells weighing anywhere from 5 to 8 pounds each, as well as a resistance band. Start your routine by doing a couple of exercises, then gradually add  more to your routine. Repeat each movement 3 to 10 times.     1. Sitting Side Tilt  This is a great exercise to begin with because it warms up the body while improving core strength. Sit up straight in your chair and tilt your upper body as far as you can to the left, return to center, then repeat the exercise on your right.        2. Back Archer  The Back Fernande Boyden will help to improve posture and open up your chest. Clasp your hands behind your back. Arch your back and push your hands away from you. Hold for 1-3 seconds, then release your hands and relax your back. Repeat.     3. Shoulder Arcs  This exercise strengthens the shoulders and works the rotator cuffs, which tend to be a problem area for older adults as those muscles are used less often. Hold a weight in each hand, palms facing in. Move your arms forward and up to shoulder level, then lower. You can also try moving your arms out to the side and raising them to shoulder height, then lowering them. As you build strength, raise your arms as high as possible over your head before lowering them. Repeat.      4. Toe Up  The Toe Up strengthens your calves. Press toes into  the floor to raise your knees, then lower and press heels into the floor. Repeat.       5. Sitting Quad Set  If you spend a significant amount of time in the sitting position, it is not uncommon to see muscle mass loss in your legs. To prevent muscle loss, the Sitting Quad Set will help keep your muscles strong and active. Raise your left foot to straighten your leg, tightening the muscle in your thigh. Hold for 10-30 seconds, then lower. Repeat with your right leg.     6. Chair Push-up  Chair Push-ups can greatly improve stability and strengthen your core. Though this exercise is slightly more advanced, you can work up to it with time. Grip the arms of the chair and press down to push your buttocks up. Lower yourself back into the chair. Repeat.      7. Side Leg Kick  For improved balance and strengthening your legs, try the Side Leg Kick exercise. Stand up, holding the back of the chair for balance, and raise your left leg out to the side. Lower your left leg, then repeat with your right leg.    8. Leg Swing  The Leg Swing is another exercise that helps improve your balance and strengthen the legs. Stand behind your chair, holding the top for balance, and lift your left leg out in front of you. Swing your left leg out behind you, then bring back to the middle. Repeat with your right leg.     9. Ankle Bend  This exercise primarily works to improve your balance. Holding your chair for support, press your left heel into the floor and gently bend your ankle and raise your toes, keeping your body straight throughout the exercise. Lower your toes and repeat with right leg.    If you are just getting started with chair exercises, avoid injury by having another person there for support. They can help make sure you are using good form for each exercise. Listen to your body and start out slowly in the beginning.  Do what you can, start with two and slowly add exercise every 3-4 days.  This will help to work your muscles to increase stability and bone health.

## 2020-07-16 NOTE — Progress Notes (Signed)
FOLLOW UP 3 MONTH  Assessment and Plan:   Tyreik was seen today for follow-up.  Diagnoses and all orders for this visit:  Essential hypertension Continue current medications: Carvediolol 23m BID, CHANGE Valsartan 1621mdaily, furosemide 40109maily is not taking BID, order changed. Monitor blood pressure at home; call if consistently over 130/80 Continue DASH diet.   Reminder to go to the ER if any CP, SOB, nausea, dizziness, severe HA, changes vision/speech, left arm numbness and tingling and jaw pain. -     CBC with Differential/Platelet -     COMPLETE METABOLIC PANEL WITH GFR  DMII with Hyperlipidemia, (HCC) Continue medications:Atorvastatin 10m73mily Discussed dietary and exercise modifications Low fat diet -     Lipid panel  Aortic atherosclerosis (HCC)[place] CT 11/15/19 Control blood pressure, cholesterol, glucose, increase exercise.   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 Education: Reviewed ABCs of diabetes management (respective goals in parentheses):  A1C (<7), blood pressure (<130/80), and cholesterol (LDL <70) Dietary recommendations Encouraged aerobic exercise.  Discussed foot care, check daily Yearly retinal exam Dental exam every 6 months Monitor blood glucose, discussed goal for patient -     Hemoglobin A1c  CKD stage 2 due to type 2 diabetes mellitus (HCC) Increase fluids  Avoid NSAIDS Blood pressure control Monitor sugars  Will continue to monitor  Vitamin D deficiency  Continue supplementation to maintain goal of 70-100 Taking Vitamin D 5,000 IU daily Defer vitamin D level  OSA (obstructive sleep apnea) Continue CPAP/BiPAP, using nightly for at least 8 hours  Helping with daytime  fatigue Weight loss still advised Discussed mask & tubing hygeine  Morbid Obesity BMI 44.0-44.9, adult (HCC) [place] dietary and exercise modifications Patient taking phemtermin 37.5mg d28my in am 30min 39mr to first meal  Medication management Continued   Venous insufficiency Discussed compression stockings Encouraged continued weight loss Continue to monitor  Oltagia Cerumen Impaction Ear lavage completed, tolerated well Canal clear, TM intact Discussed ear hygeine  Discussed med's effects and SE's. Screening labs and tests as requested with regular follow-up as recommended. Over 40 minutes of face to face interview, exam, counseling, chart review and critical decision making was performed   HPI Patient presents for follow up for HTN, chol, DM, morbid obesity, OSA, venous insuff.   Reports he is having a chirp type sound.  Reports he hears this mainly with a hiccup or pressure.  Reports he does have issues with wax build up at times.  He is taking Ozempic 1mg in 34mn once a week and phentermine   His short term goal was to get below 300lbs.  Today he is 309lbs and last OV 322lbs.  He has lost a total of   Had right TLK replacement and repairs to bilateral shoulders.   He moved her from [place] in [place] to be closer to his daughter, Bobbi PrMariam Dollard a work up for a possible TIA in 2019, normal echo, normal MRI brain, he is on plavix due to this.  He has chronic edema due to his morbid obesity, this had improved with ozempic and weight loss but has come back  Ct showed hepatomegaly and fatty liver.  He did have a positive fecal occult blood and is had a colonoscopy this year.   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 Allyn Kenner [phone]  BMI is Body mass index is 44.34 kg/m., he is working on diet and exercise.  He has OSA and has tried CPAP and BiPAP, has not been able to have one due to feeling of suffocation.   His blood  pressure has been controlled at home, today their BP is BP: 110/80 He does not workout. He denies chest pain, shortness of breath, dizziness.  Wt Readings from Last 5 Encounters:  07/16/20 (!) 309 lb (140.2 kg)  04/07/20 (!) 322 lb (146.1 kg)  02/04/20 (!) 330 lb (149.7 kg)  02/01/20 (!) 334 lb (151.5 kg)  01/18/20 (!) 334 lb (151.5 kg)    He is on cholesterol medication and denies myalgias. He has been working on diet and exercise for diabetes, With CKD stage 2 With hyperlipidemia on lipitor 35m He is on metformin 10045mBID ozempic 1 mg    he is not on bASA but he is on plavix Carotid doppler 04/2019 less than 50% Retinal hemorrhages due to DM, due annual exam due 03/2020 Lab Results  Component Value Date   HGBA1C 6.3 (H) 04/07/2020   Lab Results  Component Value Date   CHOL 155 04/07/2020   HDL 37 (L) 04/07/2020   LDLCALC 85 04/07/2020   TRIG 239 (H) 04/07/2020   CHOLHDL 4.2 04/07/2020   Lab Results  Component Value Date   GFRNONAA 70 04/07/2020     Current Medications:   Current Outpatient Medications (Endocrine & Metabolic):    metFORMIN (GLUCOPHAGE) 1000 MG tablet, Take 1 tablet (1,000 mg total) by mouth 2 (two) times daily with a meal.   Semaglutide, 1 MG/DOSE, (OZEMPIC, 1 MG/DOSE,) 2 MG/1.5ML SOPN, Inject 1 mg into the skin once a week. Inject into the skin. (Patient taking differently: Inject 1 mg into the skin once a week. )  Current Outpatient Medications (Cardiovascular):    atorvastatin (LIPITOR) 80 MG tablet, Take 1 tablet    Daily    for Cholesterol   carvedilol (COREG) 25 MG tablet, Take 25 mg by mouth 2 (two) times daily with a meal.    ezetimibe (ZETIA) 10 MG tablet, TAKE 1 TABLET BY MOUTH EVERY DAY   furosemide (LASIX) 40 MG tablet, Take 1 tablet daily for BP & Fluid Retention / Ankle Swelling   valsartan (DIOVAN) 160 MG tablet, Take 1 tablet daily for BP & Diabetic Kidney Protection   Current Outpatient Medications (Analgesics):     meloxicam (MOBIC) 15 MG tablet, TAKE 1 TABLET (15 MG TOTAL) BY MOUTH AS NEEDED FOR PAIN.  Current Outpatient Medications (Hematological):    clopidogrel (PLAVIX) 75 MG tablet, Take 1 tablet (75 mg total) by mouth daily.   vitamin B-12 (CYANOCOBALAMIN) 500 MCG tablet, Take 1,000 mcg by mouth daily. Not started  Current Outpatient Medications (Other):    Cholecalciferol 125 MCG (5000 UT) TABS, Take 5,000 Units by mouth daily.    Coenzyme Q10 200 MG capsule, Take 200 mg by mouth daily before breakfast.   glucose blood (ONETOUCH VERIO) test strip, Check blood sugar 1 time a day-E11.22.   omeprazole (PRILOSEC) 40 MG capsule, TAKE 1 CAPSULE DAILY FOR HEART BURN & INDIGESTION   phentermine (ADIPEX-P) 37.5 MG tablet, Take 1 tablet (37.5 mg total) by mouth daily before breakfast.   Sodium Sulfate-Mag Sulfate-KCl (SUTAB) 14819-316-1351G TABS, Take 1 kit by mouth as directed. BIN: [id number]: CN GROUP: [id number] ID: [id number] NOT RUN AS  CASH  Allergies:  Allergies  Allergen Reactions   Sulfamethoxazole-Trimethoprim Rash   Medical History:  has Morbid obesity ([place]); Type 2 diabetes mellitus with hyperlipidemia ([place]); Vitamin D deficiency; GERD (gastroesophageal reflux disease); OSA (obstructive sleep apnea); Hypertension; CKD stage 2 due to type 2 diabetes mellitus ([place]); Medication management; Venous insufficiency; Fecal occult blood test positive; Microcytic anemia; Aortic atherosclerosis ([place]); Hepatic steatosis; Hepatomegaly; BMI 45.0-49.9, adult ([place]); TIA (transient ischemic attack); and Thrombophilia ([place]) on their problem list.  Surgical History: reviewed and unchanged Family History: reviewed and unchanged Social History: reviewed and unchanged    Names of Other Physician/Practitioners you currently use: 1. [facility] here for primary care 2. Eye Exam, DUE 3. Dental Exam, DUE Patient Care Team: Unk Pinto, MD as PCP -  General (Internal Medicine)   Screening Tests: Immunization History  Administered Date(s) Administered   PFIZER SARS-COV-2 Vaccination 12/01/2019, 12/22/2019    Preventative care: Last colonoscopy: 3 years, polyps +heme, Recommended VCA Review of Systems:  Review of Systems  Constitutional: Negative.   HENT: Negative.   Eyes: Negative.   Respiratory: Negative for cough, hemoptysis, sputum production, shortness of breath (with walking due to body habitus) and wheezing.   Cardiovascular: Positive for leg swelling (improved). Negative for chest pain, palpitations, orthopnea and PND.  Gastrointestinal: Negative for abdominal pain, blood in stool, constipation, diarrhea, heartburn (has hiatal hernia), melena, nausea and vomiting.  Genitourinary: Negative.   Musculoskeletal: Positive for back pain, joint pain and myalgias. Negative for falls and neck pain.  Skin: Negative for rash.  Neurological: Negative.   Endo/Heme/Allergies: Negative.   Psychiatric/Behavioral: Negative.     Physical Exam: Estimated body mass index is 44.34 kg/m as calculated from the following:   Height as of 02/04/20: 5' 10"  (1.778 m).   Weight as of this encounter: 309 lb (140.2 kg). BP 110/80    Pulse 68    Temp 97.6 F (36.4 C)    Wt (!) 309 lb (140.2 kg)    SpO2 98%    BMI 44.34 kg/m  General Appearance: Obese WM, in no apparent distress.  Eyes: PERRLA, EOMs, conjunctiva no swelling or erythema, normal fundi and vessels.  Sinuses: No Frontal/maxillary tenderness  ENT/Mouth: Ext aud canals clear, normal light reflex with TMs without erythema, bulging. Mouth and nose not examined- patient wearing a facemask. Hearing normal.  Neck: Supple, thyroid normal. No bruits  Respiratory: Respiratory effort normal, BS decreased due to body habitus but equal bilaterally without rales, rhonchi, wheezing or stridor.  Cardio: RRR without murmurs, rubs or gallops. Brisk DP 2+ but 1+  TP, 1+ edema with improved venous stasis  bilateral legs, no warmth, no erythema, no hard cord.  Chest: symmetric, with normal excursions and percussion.  Abdomen: Soft, obese, nontender, no guarding, rebound, hernias, masses, or organomegaly.  Lymphatics: Non tender without lymphadenopathy.  Musculoskeletal: Full ROM all peripheral extremities,antalgic gait, decreased movement right shoulder due to pain.  Skin: Warm, dry. Neuro: Cranial nerves intact, reflexes equal bilaterally. Normal muscle tone, no cerebellar symptoms. Sensation intact bilateral feet without ulcers. Marland Kitchen  Psych: Awake and oriented X 3, normal affect, Insight and Judgment appropriate.      Garnet Sierras, Laqueta Jean, DNP [facility] 07/16/2020  12:36 PM

## 2020-07-17 LAB — COMPLETE METABOLIC PANEL WITH GFR
AG Ratio: 1.6 (calc) (ref 1.0–2.5)
ALT: 17 U/L (ref 9–46)
AST: 14 U/L (ref 10–35)
Albumin: 4.4 g/dL (ref 3.6–5.1)
Alkaline phosphatase (APISO): 101 U/L (ref 35–144)
BUN/Creatinine Ratio: 22 (calc) (ref 6–22)
BUN: 30 mg/dL — ABNORMAL HIGH (ref 7–25)
CO2: 27 mmol/L (ref 20–32)
Calcium: 10 mg/dL (ref 8.6–10.3)
Chloride: 103 mmol/L (ref 98–110)
Creat: 1.38 mg/dL — ABNORMAL HIGH (ref 0.70–1.25)
GFR, Est African American: 62 mL/min/{1.73_m2} (ref >=60)
GFR, Est Non African American: 54 mL/min/{1.73_m2} — ABNORMAL LOW (ref >=60)
Globulin: 2.7 g/dL (calc) (ref 1.9–3.7)
Glucose, Bld: 104 mg/dL — ABNORMAL HIGH (ref 65–99)
Potassium: 5.5 mmol/L — ABNORMAL HIGH (ref 3.5–5.3)
Sodium: 139 mmol/L (ref 135–146)
Total Bilirubin: 0.3 mg/dL (ref 0.2–1.2)
Total Protein: 7.1 g/dL (ref 6.1–8.1)

## 2020-07-17 LAB — CBC WITH DIFFERENTIAL/PLATELET
Absolute Monocytes: 746 cells/uL (ref 200–950)
Basophils Absolute: 74 cells/uL (ref 0–200)
Basophils Relative: 0.7 %
Eosinophils Absolute: 221 cells/uL (ref 15–500)
Eosinophils Relative: 2.1 %
HCT: 39.3 % (ref 38.5–50.0)
Hemoglobin: 12.7 g/dL — ABNORMAL LOW (ref 13.2–17.1)
Lymphs Abs: 2037 cells/uL (ref 850–3900)
MCH: 26.4 pg — ABNORMAL LOW (ref 27.0–33.0)
MCHC: 32.3 g/dL (ref 32.0–36.0)
MCV: 81.7 fL (ref 80.0–100.0)
MPV: 8.9 fL (ref 7.5–12.5)
Monocytes Relative: 7.1 %
Neutro Abs: 7424 cells/uL (ref 1500–7800)
Neutrophils Relative %: 70.7 %
Platelets: 404 10*3/uL — ABNORMAL HIGH (ref 140–400)
RBC: 4.81 10*6/uL (ref 4.20–5.80)
RDW: 13.8 % (ref 11.0–15.0)
Total Lymphocyte: 19.4 %
WBC: 10.5 10*3/uL (ref 3.8–10.8)

## 2020-07-17 LAB — HEMOGLOBIN A1C
Hgb A1c MFr Bld: 6.1 % of total Hgb — ABNORMAL HIGH (ref <5.7)
Mean Plasma Glucose: 128 (calc)
eAG (mmol/L): 7.1 (calc)

## 2020-07-17 LAB — LIPID PANEL
Cholesterol: 123 mg/dL (ref <200)
HDL: 35 mg/dL — ABNORMAL LOW (ref >=40)
LDL Cholesterol (Calc): 58 mg/dL (calc)
Non-HDL Cholesterol (Calc): 88 mg/dL (calc) (ref <130)
Total CHOL/HDL Ratio: 3.5 (calc) (ref <5.0)
Triglycerides: 238 mg/dL — ABNORMAL HIGH (ref <150)

## 2020-07-20 ENCOUNTER — Other Ambulatory Visit: Payer: Self-pay | Admitting: Internal Medicine

## 2020-07-20 MED ORDER — PHENTERMINE HCL 37.5 MG PO TABS
ORAL_TABLET | ORAL | 0 refills | Status: DC
Start: 2020-07-20 — End: 2020-07-22

## 2020-07-22 ENCOUNTER — Other Ambulatory Visit: Payer: Self-pay | Admitting: Adult Health Nurse Practitioner

## 2020-07-22 DIAGNOSIS — Z6841 Body Mass Index (BMI) 40.0 and over, adult: Secondary | ICD-10-CM

## 2020-07-22 MED ORDER — PHENTERMINE HCL 37.5 MG PO TABS: ORAL_TABLET | ORAL | 1 refills | Status: DC

## 2020-07-29 ENCOUNTER — Telehealth: Payer: Self-pay | Admitting: Internal Medicine

## 2020-08-06 ENCOUNTER — Other Ambulatory Visit: Payer: Self-pay

## 2020-08-06 DIAGNOSIS — D509 Iron deficiency anemia, unspecified: Secondary | ICD-10-CM

## 2020-08-06 NOTE — Progress Notes (Signed)
ambu

## 2020-08-06 NOTE — Telephone Encounter (Signed)
Pt scheduled for capsule endo 08/15/20@8 :30am. Referral in epic. Pt to pick up instructions Monday 08/11/20.

## 2020-08-11 ENCOUNTER — Other Ambulatory Visit: Payer: Self-pay | Admitting: Adult Health

## 2020-08-11 DIAGNOSIS — G4730 Sleep apnea, unspecified: Secondary | ICD-10-CM

## 2020-08-19 ENCOUNTER — Encounter: Payer: Self-pay | Admitting: Internal Medicine

## 2020-09-22 ENCOUNTER — Other Ambulatory Visit: Payer: Self-pay

## 2020-09-22 DIAGNOSIS — E1169 Type 2 diabetes mellitus with other specified complication: Secondary | ICD-10-CM

## 2020-09-22 DIAGNOSIS — E785 Hyperlipidemia, unspecified: Secondary | ICD-10-CM

## 2020-09-22 MED ORDER — OZEMPIC (1 MG/DOSE) 2 MG/1.5ML ~~LOC~~ SOPN: 1.0000 mg | PEN_INJECTOR | SUBCUTANEOUS | 3 refills | Status: DC

## 2020-09-22 NOTE — Telephone Encounter (Signed)
MED REFILL----OZEMPIC

## 2020-10-18 ENCOUNTER — Other Ambulatory Visit: Payer: Self-pay | Admitting: Internal Medicine

## 2020-10-18 DIAGNOSIS — E782 Mixed hyperlipidemia: Secondary | ICD-10-CM

## 2020-10-18 MED ORDER — EZETIMIBE 10 MG PO TABS: ORAL_TABLET | ORAL | 0 refills | Status: DC

## 2020-10-22 ENCOUNTER — Encounter: Payer: Self-pay | Admitting: Adult Health Nurse Practitioner

## 2020-10-22 ENCOUNTER — Other Ambulatory Visit: Payer: Self-pay

## 2020-10-22 ENCOUNTER — Ambulatory Visit (INDEPENDENT_AMBULATORY_CARE_PROVIDER_SITE_OTHER): Payer: 59 | Admitting: Adult Health Nurse Practitioner

## 2020-10-22 VITALS — BP 134/80 | HR 67 | Temp 97.7°F | Wt 308.8 lb

## 2020-10-22 DIAGNOSIS — N182 Chronic kidney disease, stage 2 (mild): Secondary | ICD-10-CM

## 2020-10-22 DIAGNOSIS — I10 Essential (primary) hypertension: Secondary | ICD-10-CM | POA: Diagnosis not present

## 2020-10-22 DIAGNOSIS — Z8673 Personal history of transient ischemic attack (TIA), and cerebral infarction without residual deficits: Secondary | ICD-10-CM

## 2020-10-22 DIAGNOSIS — E1169 Type 2 diabetes mellitus with other specified complication: Secondary | ICD-10-CM | POA: Diagnosis not present

## 2020-10-22 DIAGNOSIS — E785 Hyperlipidemia, unspecified: Secondary | ICD-10-CM | POA: Diagnosis not present

## 2020-10-22 DIAGNOSIS — I700 Atherosclerosis of aorta: Secondary | ICD-10-CM

## 2020-10-22 DIAGNOSIS — E0822 Diabetes mellitus due to underlying condition with diabetic chronic kidney disease: Secondary | ICD-10-CM

## 2020-10-22 DIAGNOSIS — E1122 Type 2 diabetes mellitus with diabetic chronic kidney disease: Secondary | ICD-10-CM

## 2020-10-22 DIAGNOSIS — E559 Vitamin D deficiency, unspecified: Secondary | ICD-10-CM

## 2020-10-22 DIAGNOSIS — I872 Venous insufficiency (chronic) (peripheral): Secondary | ICD-10-CM

## 2020-10-22 DIAGNOSIS — Z6841 Body Mass Index (BMI) 40.0 and over, adult: Secondary | ICD-10-CM

## 2020-10-22 DIAGNOSIS — Z794 Long term (current) use of insulin: Secondary | ICD-10-CM

## 2020-10-22 DIAGNOSIS — Z79899 Other long term (current) drug therapy: Secondary | ICD-10-CM

## 2020-10-22 DIAGNOSIS — D6859 Other primary thrombophilia: Secondary | ICD-10-CM

## 2020-10-22 MED ORDER — TOPIRAMATE 25 MG PO TABS: 25.0000 mg | ORAL_TABLET | Freq: Every day | ORAL | 1 refills | Status: DC

## 2020-10-22 NOTE — Progress Notes (Signed)
FOLLOW UP 3 MONTH  Assessment and Plan:  Dustin Ayala was seen today for follow-up.  Diagnoses and all orders for this visit:   Essential hypertension Continue current medications: Carvediolol 26m BID, Valsartan 1644mdaily, furosemide 4049maily Monitor blood pressure at home; call if consistently over 130/80 Continue DASH diet.   Reminder to go to the ER if any CP, SOB, nausea, dizziness, severe HA, changes vision/speech, left arm numbness and tingling and jaw pain. -     CBC with Differential/Platelet -     COMPLETE METABOLIC PANEL WITH GFR  DMII with Hyperlipidemia, (HCC) Continue medications:Atorvastatin 65m32mily Discussed dietary and exercise modifications Low fat diet -     Lipid panel  Aortic atherosclerosis (HCC)[place] CT 11/15/19 Control blood pressure, cholesterol, glucose, increase exercise.   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 Education: Reviewed 'ABCs' of diabetes management (respective goals in parentheses):  A1C (<7), blood pressure (<130/80), and cholesterol (LDL <70) Dietary recommendations Encouraged aerobic exercise.  Discussed foot care, check daily Yearly retinal exam Dental exam every 6 months Monitor blood glucose, discussed goal for patient -     Hemoglobin A1c  CKD stage 2 due to type 2 diabetes mellitus (HCC) Increase fluids  Avoid NSAIDS Blood pressure control Monitor sugars  Will continue to monitor  Vitamin D deficiency  Continue supplementation to maintain goal of 70-100 Taking Vitamin D 5,000 IU daily Defer vitamin D level  Morbid Obesity BMI 44.0-44.9, adult (HCC) [place] dietary and exercise modifications Patient taking phemtermin 37.5mg d88my in am 30min 86mr to first  meal  Medication management Continued   Venous insufficiency Discussed compression stockings Encouraged continued weight loss Continue to monitor   Further disposition pending results if labs check today. Discussed med's effects and SE's.   Over 30 minutes of face to face interview, exam, counseling, chart review, and critical decision making was performed.    HPI Patient presents for follow up for HTN, chol, DM, morbid obesity, OSA, venous insuff.   He reports overall he is doing well.  He does not have any health or medication concerns today.  He is taking Ozempic 1mg in 3mn once a week .   He stopped taking metformin 1,000mg BID63mis short term goal was to get below 300lbs.  Today he is 308lbs and last OV 309lbs / 322lbs.  He has lost a total of 27lbs.    Had right TLK replacement and repairs to bilateral shoulders.  [place] his daughter, Bobbi PriMariam Dollar a work up for a possible TIA in 2019, normal echo, normal MRI brain, he is on plavix due to this.   He has chronic edema due to his morbid obesity, this had improved with ozempic and weight loss  CT showed hepatomegaly and fatty liver.  He did have a positive fecal occult blood and is had a colonoscopy this year.   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 John HallAllyn [phone] mass index is 44.31 kg/m., he is working on diet and exercise.  He has OSA and has tried CPAP and BiPAP, has not been able to have one due to feeling of suffocation.   His blood  pressure has been controlled at home, today their BP is BP: 134/80 He does not workout. He denies chest pain, shortness of breath, dizziness.  Wt Readings from Last 5 Encounters:  10/22/20 (!) 308 lb 12.8 oz (140.1 kg)  07/16/20 (!) 309 lb (140.2 kg)  04/07/20 (!) 322 lb (146.1 kg)  02/04/20 (!) 330 lb (149.7 kg)  02/01/20 (!) 334 lb (151.5 kg)    He is on cholesterol  medication and denies myalgias. He has been working on diet and exercise for diabetes, With CKD stage 2 With hyperlipidemia on lipitor 73m He is on metformin 10038mBID ozempic 1 mg    he is not on bASA but he is on plavix Carotid doppler 04/2019 less than 50% Retinal hemorrhages due to DM, due annual exam due 03/2020 Lab Results  Component Value Date   HGBA1C 6.1 (H) 07/16/2020   Lab Results  Component Value Date   CHOL 123 07/16/2020   HDL 35 (L) 07/16/2020   LDLCALC 58 07/16/2020   TRIG 238 (H) 07/16/2020   CHOLHDL 3.5 07/16/2020   Lab Results  Component Value Date   GFRNONAA 54 (L) 07/16/2020     Current Medications:   Current Outpatient Medications (Endocrine & Metabolic):  .  metFORMIN (GLUCOPHAGE) 1000 MG tablet, Take 1 tablet (1,000 mg total) by mouth 2 (two) times daily with a meal. .  Semaglutide, 1 MG/DOSE, (OZEMPIC, 1 MG/DOSE,) 2 MG/1.5ML SOPN, Inject 1 mg into the skin once a week. Inject into the skin.  Current Outpatient Medications (Cardiovascular):  .  atorvastatin (LIPITOR) 80 MG tablet, Take 1 tablet    Daily    for Cholesterol .  carvedilol (COREG) 25 MG tablet, Take 25 mg by mouth 2 (two) times daily with a meal.  .  ezetimibe (ZETIA) 10 MG tablet, Take  1 tablet  Daily  for Cholesterol .  furosemide (LASIX) 40 MG tablet, Take 1 tablet daily for BP & Fluid Retention / Ankle Swelling .  valsartan (DIOVAN) 160 MG tablet, Take 1 tablet daily for BP & Diabetic Kidney Protection   Current Outpatient Medications (Analgesics):  .  meloxicam (MOBIC) 15 MG tablet, TAKE 1 TABLET (15 MG TOTAL) BY MOUTH AS NEEDED FOR PAIN.  Current Outpatient Medications (Hematological):  .  clopidogrel (PLAVIX) 75 MG tablet, Take 1 tablet (75 mg total) by mouth daily. .  vitamin B-12 (CYANOCOBALAMIN) 500 MCG tablet, Take 1,000 mcg by mouth daily. Not started  Current Outpatient Medications (Other):  . Marland KitchenCholecalciferol 125 MCG (5000 UT) TABS, Take 5,000 Units by mouth daily.   .  Coenzyme Q10 200 MG capsule, Take 200 mg by mouth daily before breakfast. .  glucose blood (ONETOUCH VERIO) test strip, Check blood sugar 1 time a day-E11.22. . Marland Kitchenomeprazole (PRILOSEC) 40 MG capsule, Take       1 capsule       Daily         to Prevent Heart burn & Indigestion .  phentermine (ADIPEX-P) 37.5 MG tablet, Take 1 tablet daily for Dieting & Weight Loss .  Sodium Sulfate-Mag Sulfate-KCl (SUTAB) [phone] TABS, Take 1 kit by mouth as directed. BIN: [id number]: CN GROUP: [id number] ID: [id number] NOT RUN AS CASH  Allergies:  Allergies  Allergen Reactions  . Sulfamethoxazole-Trimethoprim Rash   Medical History:  [place] Morbid [place] ([place] Type 2 diabetes mellitus with hyperlipidemia ([place] Vitamin D deficiency; GERD (gastroesophageal reflux disease); OSA (obstructive sleep apnea); Hypertension; CKD stage 2 due to type  2 diabetes mellitus ([place]); Medication management; Venous insufficiency; Fecal occult blood test positive; Microcytic anemia; Aortic atherosclerosis ([place]); Hepatic steatosis; Hepatomegaly; BMI 45.0-49.9, adult ([place]); TIA (transient ischemic attack); and Thrombophilia ([place]) on their problem list.  Surgical History:  He  has a past surgical history that includes Replacement total knee (Left); Total shoulder replacement (Right); Tonsillectomy; Colonoscopy (2010); and Total shoulder arthroplasty (Left, 02/07/2020). Family History:  Hisfamily history includes COPD in his mother and sister; Cancer in his mother; Cancer (age of onset: 41) in his father; Colon cancer in his father; Crohn's disease in his son; Diabetes in his father; Heart disease in his father; Hyperlipidemia in his father and mother; Hypertension in his father and mother; Osteoporosis in his mother; Prostate cancer in his father. Social History:   reports that he has never smoked. He has never used smokeless tobacco. He reports previous alcohol use. He reports that he does not use drugs.  Names of  Other Physician/Practitioners you currently use: 1. [facility] here for primary care 2. Eye Exam, DUE 3. Dental Exam, DUE Patient Care Team: Unk Pinto, MD as PCP - General (Internal Medicine)   Screening Tests: Immunization History  Administered Date(s) Administered  . PFIZER([place])SARS-COV-2 Vaccination 12/01/2019, 12/22/2019, 07/21/2020    Preventative care: Last colonoscopy: 3 years, polyps +heme, Recommended VCA Review of Systems:  Review of Systems  Constitutional: Negative.   HENT: Negative.   Eyes: Negative.   Respiratory: Negative for cough, hemoptysis, sputum production, shortness of breath (with walking due to body habitus) and wheezing.   Cardiovascular: Positive for leg swelling (improved). Negative for chest pain, palpitations, orthopnea and PND.  Gastrointestinal: Negative for abdominal pain, blood in stool, constipation, diarrhea, heartburn (has hiatal hernia), melena, nausea and vomiting.  Genitourinary: Negative.   Musculoskeletal: Negative for back pain, falls, joint pain, myalgias and neck pain.  Skin: Negative for rash.  Neurological: Negative.   Endo/Heme/Allergies: Negative.   Psychiatric/Behavioral: Negative.     Physical Exam: Estimated body mass index is 44.31 kg/m as calculated from the following:   Height as of 02/04/20: 5' 10"  (1.778 m).   Weight as of this encounter: 308 lb 12.8 oz (140.1 kg). BP 134/80   Pulse 67   Temp 97.7 F (36.5 C)   Wt (!) 308 lb 12.8 oz (140.1 kg)   SpO2 97%   BMI 44.31 kg/m  General Appearance: Obese WM, in no apparent distress.  Eyes: PERRLA, EOMs, conjunctiva no swelling or erythema, normal fundi and vessels.  Sinuses: No Frontal/maxillary tenderness  ENT/Mouth: Ext aud canals clear, normal light reflex with TMs without erythema, bulging. Mouth and nose not examined- patient wearing a facemask. Hearing normal.  Neck: Supple, thyroid normal. No bruits   Respiratory: Respiratory effort normal, BS decreased due to body habitus but equal bilaterally without rales, rhonchi, wheezing or stridor.  Cardio: RRR without murmurs, rubs or gallops. Brisk DP 2+ but 1+  TP, 1+ edema with improved venous stasis bilateral legs, no warmth, no erythema, no hard cord.  Chest: symmetric, with normal excursions and percussion.  Abdomen: Soft, obese, nontender, no guarding, rebound, hernias, masses, or organomegaly.  Lymphatics: Non tender without lymphadenopathy.  Musculoskeletal: Full ROM all peripheral extremities,antalgic gait, decreased movement right shoulder due to pain.  Skin: Warm, dry. Neuro: Cranial nerves intact, reflexes equal bilaterally. Normal muscle tone, no cerebellar symptoms. Sensation intact bilateral feet without ulcers. Marland Kitchen  Psych: Awake and oriented X 3, normal affect, Insight and Judgment appropriate.  Garnet Sierras, Laqueta Jean, DNP [facility] 10/22/2020  11:28 AM

## 2020-10-23 LAB — COMPLETE METABOLIC PANEL WITH GFR
AG Ratio: 1.5 (calc) (ref 1.0–2.5)
ALT: 23 U/L (ref 9–46)
AST: 19 U/L (ref 10–35)
Albumin: 4.3 g/dL (ref 3.6–5.1)
Alkaline phosphatase (APISO): 116 U/L (ref 35–144)
BUN/Creatinine Ratio: 27 (calc) — ABNORMAL HIGH (ref 6–22)
BUN: 29 mg/dL — ABNORMAL HIGH (ref 7–25)
CO2: 24 mmol/L (ref 20–32)
Calcium: 9.7 mg/dL (ref 8.6–10.3)
Chloride: 105 mmol/L (ref 98–110)
Creat: 1.08 mg/dL (ref 0.70–1.25)
GFR, Est African American: 84 mL/min/{1.73_m2} (ref >=60)
GFR, Est Non African American: 72 mL/min/{1.73_m2} (ref >=60)
Globulin: 2.9 g/dL (calc) (ref 1.9–3.7)
Glucose, Bld: 100 mg/dL — ABNORMAL HIGH (ref 65–99)
Potassium: 4.5 mmol/L (ref 3.5–5.3)
Sodium: 139 mmol/L (ref 135–146)
Total Bilirubin: 0.3 mg/dL (ref 0.2–1.2)
Total Protein: 7.2 g/dL (ref 6.1–8.1)

## 2020-10-23 LAB — CBC WITH DIFFERENTIAL/PLATELET
Absolute Monocytes: 760 cells/uL (ref 200–950)
Basophils Absolute: 70 cells/uL (ref 0–200)
Basophils Relative: 0.7 %
Eosinophils Absolute: 270 cells/uL (ref 15–500)
Eosinophils Relative: 2.7 %
HCT: 42.3 % (ref 38.5–50.0)
Hemoglobin: 13.9 g/dL (ref 13.2–17.1)
Lymphs Abs: 2320 cells/uL (ref 850–3900)
MCH: 25.7 pg — ABNORMAL LOW (ref 27.0–33.0)
MCHC: 32.9 g/dL (ref 32.0–36.0)
MCV: 78.2 fL — ABNORMAL LOW (ref 80.0–100.0)
MPV: 9.3 fL (ref 7.5–12.5)
Monocytes Relative: 7.6 %
Neutro Abs: 6580 cells/uL (ref 1500–7800)
Neutrophils Relative %: 65.8 %
Platelets: 345 10*3/uL (ref 140–400)
RBC: 5.41 10*6/uL (ref 4.20–5.80)
RDW: 15 % (ref 11.0–15.0)
Total Lymphocyte: 23.2 %
WBC: 10 10*3/uL (ref 3.8–10.8)

## 2020-10-23 LAB — HEMOGLOBIN A1C
Hgb A1c MFr Bld: 6.4 % of total Hgb — ABNORMAL HIGH (ref <5.7)
Mean Plasma Glucose: 137 mg/dL
eAG (mmol/L): 7.6 mmol/L

## 2020-11-11 ENCOUNTER — Other Ambulatory Visit: Payer: Self-pay

## 2020-11-11 ENCOUNTER — Telehealth: Payer: Self-pay

## 2020-11-11 NOTE — Telephone Encounter (Signed)
LVM for return about MEDS

## 2020-11-11 NOTE — Telephone Encounter (Signed)
Patient reports that he has not been taking the PHENTERMINE & TOPAMAX together.   Needs phentermine refilled. Send to pharmacy on file

## 2020-11-11 NOTE — Telephone Encounter (Signed)
-----   Message from Garnet Sierras, NP sent at 11/10/2020  4:56 PM EST ----- Regarding: RE: meds Contact: [phone] Is he taking the topamax and phentermine together?   Is he continuing the Ozempic?   Sincerely,           Garnet Sierras, NP   ----- Message ----- From: Elenor Quinones, CMA Sent: 11/10/2020  12:18 PM EST To: Garnet Sierras, NP Subject: meds                                           Doesn't feel like TOPAMAX is working, wanting something different sent into pharmacy or would like to speak with you

## 2020-11-12 ENCOUNTER — Other Ambulatory Visit: Payer: Self-pay | Admitting: Adult Health Nurse Practitioner

## 2020-11-12 DIAGNOSIS — G8929 Other chronic pain: Secondary | ICD-10-CM

## 2020-11-12 MED ORDER — PHENTERMINE HCL 37.5 MG PO TABS: ORAL_TABLET | ORAL | 0 refills | Status: DC

## 2020-11-19 ENCOUNTER — Other Ambulatory Visit: Payer: Self-pay

## 2020-11-19 DIAGNOSIS — E782 Mixed hyperlipidemia: Secondary | ICD-10-CM

## 2020-11-19 MED ORDER — EZETIMIBE 10 MG PO TABS: ORAL_TABLET | ORAL | 0 refills | Status: DC

## 2020-11-19 NOTE — Telephone Encounter (Signed)
Refill on EZEMTIMIBE 10 MG

## 2020-12-09 LAB — HM DIABETES EYE EXAM

## 2020-12-14 ENCOUNTER — Other Ambulatory Visit: Payer: Self-pay | Admitting: Adult Health Nurse Practitioner

## 2020-12-14 ENCOUNTER — Other Ambulatory Visit: Payer: Self-pay | Admitting: Internal Medicine

## 2020-12-14 DIAGNOSIS — Z794 Long term (current) use of insulin: Secondary | ICD-10-CM

## 2020-12-14 DIAGNOSIS — E785 Hyperlipidemia, unspecified: Secondary | ICD-10-CM

## 2020-12-14 DIAGNOSIS — N182 Chronic kidney disease, stage 2 (mild): Secondary | ICD-10-CM

## 2020-12-14 DIAGNOSIS — E0822 Diabetes mellitus due to underlying condition with diabetic chronic kidney disease: Secondary | ICD-10-CM

## 2020-12-14 DIAGNOSIS — E1169 Type 2 diabetes mellitus with other specified complication: Secondary | ICD-10-CM

## 2020-12-14 MED ORDER — CARVEDILOL 12.5 MG PO TABS: ORAL_TABLET | ORAL | 1 refills | Status: DC

## 2020-12-16 ENCOUNTER — Encounter: Payer: Self-pay | Admitting: *Deleted

## 2020-12-22 ENCOUNTER — Encounter: Payer: Self-pay | Admitting: *Deleted

## 2021-01-05 ENCOUNTER — Encounter: Payer: 59 | Admitting: Adult Health

## 2021-01-15 ENCOUNTER — Other Ambulatory Visit: Payer: Self-pay | Admitting: Internal Medicine

## 2021-01-15 DIAGNOSIS — G459 Transient cerebral ischemic attack, unspecified: Secondary | ICD-10-CM

## 2021-01-15 DIAGNOSIS — E1169 Type 2 diabetes mellitus with other specified complication: Secondary | ICD-10-CM

## 2021-01-15 DIAGNOSIS — I700 Atherosclerosis of aorta: Secondary | ICD-10-CM

## 2021-01-15 DIAGNOSIS — E785 Hyperlipidemia, unspecified: Secondary | ICD-10-CM

## 2021-01-16 ENCOUNTER — Telehealth: Payer: PPO | Admitting: Adult Health

## 2021-01-16 ENCOUNTER — Encounter: Payer: Self-pay | Admitting: Adult Health

## 2021-01-16 DIAGNOSIS — I10 Essential (primary) hypertension: Secondary | ICD-10-CM | POA: Diagnosis not present

## 2021-01-16 DIAGNOSIS — E782 Mixed hyperlipidemia: Secondary | ICD-10-CM

## 2021-01-16 MED ORDER — PROMETHAZINE-DM 6.25-15 MG/5ML PO SYRP: 5.0000 mL | ORAL_SOLUTION | Freq: Four times a day (QID) | ORAL | 1 refills | Status: DC | PRN

## 2021-01-16 MED ORDER — DEXAMETHASONE 1 MG PO TABS: ORAL_TABLET | ORAL | 0 refills | Status: DC

## 2021-01-16 MED ORDER — AZITHROMYCIN 250 MG PO TABS: ORAL_TABLET | ORAL | 1 refills | Status: AC

## 2021-01-16 NOTE — Telephone Encounter (Signed)
Patient  Called to report symptomatic and tested Positive for COVID-19 home test - 01/16/21. Patient requests advise for Positive COVID-19 protocol. Call or my chart.

## 2021-01-16 NOTE — Telephone Encounter (Signed)
THIS ENCOUNTER IS A VIRTUAL VISIT DUE TO COVID-19 - PATIENT WAS NOT SEEN IN THE OFFICE.  PATIENT HAS CONSENTED TO VIRTUAL VISIT / TELEMEDICINE VISIT   Virtual Visit via telephone Note  I connected with Dustin Ayala on 01/16/2021 by telephone.  I verified that I am speaking with the correct person using two identifiers.    I discussed the limitations of evaluation and management by telemedicine and the availability of in person appointments. The patient expressed understanding and agreed to proceed.  History of Present Illness:   65 y.o. patient with hx of T2DM, TIA, morbid obesity contacted office due to positive rapid covid 19 test. OV was conducted as telephone visit to minimize exposure in office. This patient has been vaccinated for covid 19 with booster, last 07/21/2021.  Sx began 5-6 days ago, initially feeling vaguely unwell, then developed persistent cough (mildly productive in the last 24 hours, green/yellow), he denies fever but feels warm/cold, has myalgias/body aches. He denies dyspnea, wheezing, CP, dizziness, extreme fatigue/malaise. Reports took free test today that was positive this AM.   He has been taking alka seltzer plus without clear benefit.   His diabetes is generally well controlled, admits hasn't been checking glucose since ill, does have supplies.  Lab Results  Component Value Date   HGBA1C 6.4 (H) 10/22/2020   Exposures: possibly wife, was ill a week or two ago  Vaccination: 3 pfizer vaccines   Medications  Current Outpatient Medications (Endocrine & Metabolic):  .  metFORMIN (GLUCOPHAGE) 1000 MG tablet, Take 1 tablet  2 x /day  with Meals  for Diabetes .  Semaglutide, 1 MG/DOSE, (OZEMPIC, 1 MG/DOSE,) 2 MG/1.5ML SOPN, Inject 1 mg into the skin once a week. Inject into the skin.  Current Outpatient Medications (Cardiovascular):  .  atorvastatin (LIPITOR) 80 MG tablet, Take 1 tablet    Daily    for Cholesterol .  carvedilol (COREG) 12.5 MG tablet, Take 1  tablet  2 x /day (every 12 hours)  for BP & Heart .  ezetimibe (ZETIA) 10 MG tablet, Take  1 tablet  Daily  for Cholesterol .  furosemide (LASIX) 40 MG tablet, Take 1 tablet daily for BP & Fluid Retention / Ankle Swelling .  valsartan (DIOVAN) 160 MG tablet, Take 1 tablet daily for BP & Diabetic Kidney Protection   Current Outpatient Medications (Analgesics):  .  meloxicam (MOBIC) 15 MG tablet, TAKE 1 TABLET (15 MG TOTAL) BY MOUTH AS NEEDED FOR PAIN.  Current Outpatient Medications (Hematological):  .  clopidogrel (PLAVIX) 75 MG tablet, Take 1 tablet (75 mg total) by mouth daily. .  vitamin B-12 (CYANOCOBALAMIN) 500 MCG tablet, Take 1,000 mcg by mouth daily. Not started  Current Outpatient Medications (Other):  Marland Kitchen  Cholecalciferol 125 MCG (5000 UT) TABS, Take 5,000 Units by mouth daily.  .  Coenzyme Q10 200 MG capsule, Take 200 mg by mouth daily before breakfast. .  glucose blood (ONETOUCH VERIO) test strip, Check blood sugar 1 time a day-E11.22. Marland Kitchen  omeprazole (PRILOSEC) 40 MG capsule, Take       1 capsule       Daily         to Prevent Heart burn & Indigestion .  phentermine (ADIPEX-P) 37.5 MG tablet, Take      1 tablet       Daily        for Dieting & Weight Loss .  Sodium Sulfate-Mag Sulfate-KCl (SUTAB) [phone] MG TABS, Take 1 kit by mouth as  directed. BIN: [id number] PCN: CN GROUP: [id number] MEMBER ID: [id number]; DO NOT RUN AS CASH .  topiramate (TOPAMAX) 25 MG tablet, Take 1 tablet (25 mg total) by mouth daily.  Allergies:  Allergies  Allergen Reactions  . Sulfamethoxazole-Trimethoprim Rash    Problem list He has Morbid obesity ([place]); Type 2 diabetes mellitus with hyperlipidemia ([place]); Vitamin D deficiency; GERD (gastroesophageal reflux disease); OSA (obstructive sleep apnea); Hypertension; CKD stage 2 due to type 2 diabetes mellitus ([place]); Medication management; Venous insufficiency; Fecal occult blood test positive; Microcytic anemia; Aortic atherosclerosis ([place]); Hepatic  steatosis; Hepatomegaly; BMI 45.0-49.9, adult ([place]); TIA (transient ischemic attack); and Thrombophilia ([place]) on their problem list.   Social History:   reports that he has never smoked. He has never used smokeless tobacco. He reports previous alcohol use. He reports that he does not use drugs.   Observations/Objective:  General : mildly ill sounding patient in no acute distress HEENT: hoarse vocal quality, occasional brief cough for duration of visit Lungs: speaks in complete sentences, no audible wheezing, no apparent distress Neurological: alert, oriented x 3 Psychiatric: pleasant, judgement appropriate   Assessment and Plan:  COVID-19  Covid 19 positive per rapid screening test in vaccinated x 3 Currently symptoms are mild to moderate, on day 5-6 Risk factors include htn, morbid obesity, T2DM He is past day 5 and not meeting local referral criteria for antiviral or monoclonal antibody infusion Regular breathing exercises, proning EC bASA daily for clot prevention unless contraindicated, regular walking/calf exercises Take tylenol PRN temp 101+ Push hydration Sx supportive therapy Steroid taper was offered - start monitoring glucose more closely, reports does have all necessary supplies Immune support with vitamin C, zinc, vitamin D reviewed With high risk patient, progressive sx, increased productivity, will also send in zpak Follow up via mychart or telephone call service if needed Advised patient obtain O2 monitor; present to ED if persistently <88% or with severe dyspnea, any CP, fever uncontrolled by tylenol, confusion, sudden decline. Wife is there to support.  Should remain in isolation until 24-48 hours fever free without tylenol, sx such as cough are improved.     Follow Up Instructions:  I discussed the assessment and treatment plan with the patient. The patient was provided an opportunity to ask questions and all were answered. The patient agreed with the plan and  demonstrated an understanding of the instructions.   The patient was advised to call back or seek an in-person evaluation if the symptoms worsen or if the condition fails to improve as anticipated.  I provided 20 minutes of non-face-to-face time during this encounter.   Izora Ribas, NP

## 2021-01-16 NOTE — Addendum Note (Signed)
Addended by: Izora Ribas on: 01/16/2021 02:25 PM   Modules accepted: Orders, Level of Service

## 2021-01-18 ENCOUNTER — Other Ambulatory Visit: Payer: Self-pay | Admitting: Adult Health Nurse Practitioner

## 2021-01-18 DIAGNOSIS — E782 Mixed hyperlipidemia: Secondary | ICD-10-CM

## 2021-01-20 ENCOUNTER — Encounter: Payer: 59 | Admitting: Adult Health

## 2021-03-10 ENCOUNTER — Encounter: Payer: PPO | Admitting: Adult Health

## 2021-03-17 DIAGNOSIS — H02834 Dermatochalasis of left upper eyelid: Secondary | ICD-10-CM | POA: Diagnosis not present

## 2021-03-17 DIAGNOSIS — H2513 Age-related nuclear cataract, bilateral: Secondary | ICD-10-CM | POA: Diagnosis not present

## 2021-03-17 DIAGNOSIS — H02831 Dermatochalasis of right upper eyelid: Secondary | ICD-10-CM | POA: Diagnosis not present

## 2021-03-17 DIAGNOSIS — E119 Type 2 diabetes mellitus without complications: Secondary | ICD-10-CM | POA: Diagnosis not present

## 2021-03-17 LAB — HM DIABETES EYE EXAM

## 2021-03-18 ENCOUNTER — Encounter: Payer: Self-pay | Admitting: Internal Medicine

## 2021-03-23 NOTE — Progress Notes (Signed)
COMPLETE PHYSICAL Assessment and Plan:  Encounter for general adult medical examination with abnormal findings 1 year    Acquired Thrombophilia ([place]) MONITOR- patient on plavix - Check CBC  Aortic atherosclerosis (HCC) Control blood pressure, cholesterol, glucose, increase exercise.   Morbid obesity (HCC)       Continue TOPAMAX and  PHENTERMINE CONTINUE OZEMPIC CONTINUE TO CUT BACK ON PORTION, EATING OUT, AND ADD IN EXERCISE.         - follow up 3 months for progress monitoring - increase veggies, decrease carbs - long discussion about weight loss, diet, and exercise - Started a job this morning work at [employer], more activity  CKD stage 2 due to type 2 diabetes mellitus ([place]) -     COMPLETE METABOLIC PANEL WITH GFR -     Hemoglobin A1c -     EKG 12-Lead  Type 2 diabetes mellitus with hyperlipidemia (HCC) -     Lipid panel -     Hemoglobin A1c -     EKG 12-Lead Discussed general issues about diabetes pathophysiology and management., Educational material distributed., Suggested low cholesterol diet., Encouraged aerobic exercise., Discussed foot care., Reminded to get yearly retinal exam. CONTINUE OZEMPIC, CONTINUE WEIGHT LOSS  Essential hypertension -     COMPLETE METABOLIC PANEL WITH GFR -     TSH -     Cancel: Urinalysis, Routine w reflex microscopic -     Cancel: Microalbumin / creatinine urine ratio       - U/A microalbumin/creat ratio, U/A microscopic -     EKG 12-Lead - continue medications, DASH diet, exercise and monitor at home. Call if greater than 130/80.   Vitamin D deficiency -     VITAMIN D 25 Hydroxy (Vit-D Deficiency, Fractures)  Medication management -     Magnesium   BMI 45.0-49.9, adult ([place]) - follow up 3 months for progress monitoring - increase veggies, decrease carbs - long discussion about weight loss, diet, and exercise  Hepatic steatosis with Hepatomegaly -     COMPLETE METABOLIC PANEL WITH GFR Check labs, avoid tylenol,  alcohol, weight loss advised.   Gastroesophageal reflux disease with esophagitis without hemorrhage Continue PPI/H2 blocker, diet discussed -Check Magnesium  OSA (obstructive sleep apnea) Long discussion about how it is harder to manage his chronic co morbidities if he is not treating OSA Continue weight loss   HX of TIA (transient ischemic attack) Continue plavix Control blood pressure, cholesterol, glucose, increase exercise.   Screening PSA (prostate specific antigen) -     PSA  Discussed med's effects and SE's. Screening labs and tests as requested with regular follow-up as recommended. Over 40 minutes of exam, counseling, chart review and critical decision making was performed  HPI Patient presents for CPE and follow up for HTN, chol, DM, morbid obesity, OSA, venous insuff.   He moved her from [place] in 2019 to be closer to his daughter, Mariam Dollar.  He had a work up for a possible TIA in 2019, normal echo, normal MRI brain, he is on plavix due to this.  He has chronic edema due to his morbid obesity.   He had right shoulder replacement 2019 in [place], following with ortho here, had a reverse shoulder surgery with Dr. Tamera Punt 01/24/20 Previous Ct showed hepatomegaly and fatty liver.  He did have a positive fecal occult blood and is due for a colonoscopy this year.   BMI is Body mass index is 43.19 kg/m., he is working on diet and exercise.  He has OSA and has tried CPAP and BiPAP, has not been able to have one due to feeling of suffocation.  Wt Readings from Last 3 Encounters:  03/24/21 (!) 301 lb (136.5 kg)  10/22/20 (!) 308 lb 12.8 oz (140.1 kg)  07/16/20 (!) 309 lb (140.2 kg)   His blood pressure has been controlled at home, today their BP is BP: 110/78 He does not workout. He denies chest pain, shortness of breath, dizziness.   He is on cholesterol medication and denies myalgias. He has been working on diet and exercise for diabetes, With CKD stage 2Sugars have been  under 109-115 ozempic 1 mg  Lab Results  Component Value Date   HGBA1C 6.4 (H) 10/22/2020     With hyperlipidemia on lipitor 80mg , Zetia 10mg   He is not on bASA but he is on plavix Carotid doppler 04/2019 less than 50% Retinal exam 03/2021 normal Last Cholesterol LDL was in goal, triglycerides remain elevated  Lab Results  Component Value Date   CHOL 123 07/16/2020   HDL 35 (L) 07/16/2020   LDLCALC 58 07/16/2020   TRIG 238 (H) 07/16/2020   CHOLHDL 3.5 07/16/2020   Lab Results  Component Value Date   GFRNONAA 72 10/22/2020     Current Medications:   Current Outpatient Medications (Endocrine & Metabolic):    Semaglutide, 1 MG/DOSE, (OZEMPIC, 1 MG/DOSE,) 2 MG/1.5ML SOPN, Inject 1 mg into the skin once a week. Inject into the skin.  Current Outpatient Medications (Cardiovascular):    atorvastatin (LIPITOR) 80 MG tablet, Take 1 tablet    Daily    for Cholesterol   carvedilol (COREG) 12.5 MG tablet, Take 1 tablet  2 x /day (every 12 hours)  for BP & Heart   ezetimibe (ZETIA) 10 MG tablet, TAKE 1 TABLET BY MOUTH EVERY DAY FOR CHOLESTEROL   furosemide (LASIX) 40 MG tablet, Take 1 tablet daily for BP & Fluid Retention / Ankle Swelling   valsartan (DIOVAN) 160 MG tablet, Take 1 tablet daily for BP & Diabetic Kidney Protection    Current Outpatient Medications (Hematological):    vitamin B-12 (CYANOCOBALAMIN) 500 MCG tablet, Take 1,000 mcg by mouth daily. Not started   clopidogrel (PLAVIX) 75 MG tablet, Take 1 tablet (75 mg total) by mouth daily.  Current Outpatient Medications (Other):    Cholecalciferol 125 MCG (5000 UT) TABS, Take 5,000 Units by mouth daily.    Coenzyme Q10 200 MG capsule, Take 200 mg by mouth daily before breakfast.   glucose blood (ONETOUCH VERIO) test strip, Check blood sugar 1 time a day-E11.22.   omeprazole (PRILOSEC) 40 MG capsule, Take       1 capsule       Daily         to Prevent Heart burn & Indigestion   phentermine (ADIPEX-P) 37.5 MG tablet, Take       1 tablet       Daily        for Dieting & Weight Loss   topiramate (TOPAMAX) 25 MG tablet, Take 1 tablet (25 mg total) by mouth daily.  Allergies:  Allergies  Allergen Reactions   Sulfamethoxazole-Trimethoprim Rash   Colonoscopy: 01/18/20 Precancerous Polyps follow up 3 years recommended Dr Hilarie Fredrickson Patient Care Team: Unk Pinto, MD as PCP - General (Internal Medicine)  Medical History:  has Morbid obesity ([place]); Type 2 diabetes mellitus with hyperlipidemia ([place]); Vitamin D deficiency; GERD (gastroesophageal reflux disease); OSA (obstructive sleep apnea); Hypertension; CKD stage 2 due to type 2 diabetes  mellitus ([place]); Medication management; Venous insufficiency; Fecal occult blood test positive; Microcytic anemia; Aortic atherosclerosis ([place]); Hepatic steatosis; Hepatomegaly; BMI 45.0-49.9, adult ([place]); TIA (transient ischemic attack); and Thrombophilia ([place]) on their problem list. Surgical History:  He  has a past surgical history that includes Replacement total knee (Left); Total shoulder replacement (Right); Tonsillectomy; Colonoscopy (2010); and Total shoulder arthroplasty (Left, 02/07/2020). Family History:  His family history includes COPD in his mother and sister; Cancer in his mother; Cancer (age of onset: 47) in his father; Colon cancer in his father; Crohn's disease in his son; Diabetes in his father; Heart disease in his father; Hyperlipidemia in his father and mother; Hypertension in his father and mother; Osteoporosis in his mother; Prostate cancer in his father.   Social History:   reports that he has never smoked. He has never used smokeless tobacco. He reports previous alcohol use. He reports that he does not use drugs. Review of Systems:  Review of Systems  Constitutional: Negative.  Negative for chills, fever, malaise/fatigue and weight loss.  HENT: Negative.  Negative for congestion, ear discharge, hearing loss, sore throat and tinnitus.   Eyes: Negative.  Negative  for blurred vision, discharge and redness.  Respiratory:  Negative for cough, hemoptysis, sputum production, shortness of breath (with walking due to body habitus) and wheezing.   Cardiovascular:  Positive for leg swelling (improved). Negative for chest pain, palpitations, orthopnea and PND.  Gastrointestinal:  Positive for constipation (improved since off Metformin). Negative for abdominal pain, blood in stool, diarrhea, heartburn (has hiatal hernia), melena, nausea and vomiting.  Genitourinary: Negative.  Negative for dysuria, frequency and urgency.  Musculoskeletal:  Positive for back pain, joint pain (Persistent right shoulder pain) and myalgias. Negative for falls and neck pain.  Skin:  Positive for rash.  Neurological:  Positive for tingling (midcalf bilaterally) and sensory change (midcalf bilaterally). Negative for dizziness, seizures, weakness and headaches.  Endo/Heme/Allergies: Negative.  Does not bruise/bleed easily.  Psychiatric/Behavioral: Negative.  Negative for depression, hallucinations, memory loss and suicidal ideas. The patient does not have insomnia.    Physical Exam: Estimated body mass index is 43.19 kg/m as calculated from the following:   Height as of this encounter: 5\' 10"  (1.778 m).   Weight as of this encounter: 301 lb (136.5 kg). BP 110/78   Pulse 67   Temp (!) 97.3 F (36.3 C)   Ht 5\' 10"  (1.778 m)   Wt (!) 301 lb (136.5 kg)   SpO2 93%   BMI 43.19 kg/m  General Appearance: Obese WM, in no apparent distress.  Eyes: PERRLA, EOMs, conjunctiva no swelling or erythema, normal fundi and vessels.  Sinuses: No Frontal/maxillary tenderness  ENT/Mouth: Ext aud canals clear, normal light reflex with TMs without erythema, bulging. Mouth and nose not examined- patient wearing a facemask. Hearing normal.  Neck: Supple, thyroid normal. No bruits  Respiratory: Respiratory effort normal, BS decreased due to body habitus but equal bilaterally without rales, rhonchi, wheezing  or stridor.  Cardio: RRR without murmurs, rubs or gallops. Brisk DP 2+ , 1+ edema with improved venous stasis bilateral legs, no warmth, no erythema, no hard cord.  Chest: symmetric, with normal excursions and percussion.  Abdomen: Soft, obese, nontender, no guarding, rebound, hernias, masses, or organomegaly.  Lymphatics: Non tender without lymphadenopathy.  Genitourinary: defer Musculoskeletal: Full ROM all peripheral extremities,antalgic gait, decreased movement right shoulder due to pain.  Skin: Warm, dry. Neuro: Cranial nerves intact, reflexes equal bilaterally. Normal muscle tone, no cerebellar symptoms. Sensation intact with  monofilament bilateral feet without ulcers. Marland Kitchen  Psych: Awake and oriented X 3, normal affect, Insight and Judgment appropriate.  Foot Exam :Performed with monofilament- sensation intact bilaterally EKG: Normal, no ST changes.   Wanell Lorenzi W Alithia Zavaleta 3:02 PM [facility]

## 2021-03-24 ENCOUNTER — Encounter: Payer: Self-pay | Admitting: Nurse Practitioner

## 2021-03-24 ENCOUNTER — Other Ambulatory Visit: Payer: Self-pay

## 2021-03-24 ENCOUNTER — Ambulatory Visit (INDEPENDENT_AMBULATORY_CARE_PROVIDER_SITE_OTHER): Payer: PPO | Admitting: Nurse Practitioner

## 2021-03-24 VITALS — BP 110/78 | HR 67 | Temp 97.3°F | Ht 70.0 in | Wt 301.0 lb

## 2021-03-24 DIAGNOSIS — E1169 Type 2 diabetes mellitus with other specified complication: Secondary | ICD-10-CM | POA: Diagnosis not present

## 2021-03-24 DIAGNOSIS — Z0001 Encounter for general adult medical examination with abnormal findings: Secondary | ICD-10-CM | POA: Diagnosis not present

## 2021-03-24 DIAGNOSIS — Z0000 Encounter for general adult medical examination without abnormal findings: Secondary | ICD-10-CM | POA: Diagnosis not present

## 2021-03-24 DIAGNOSIS — K2100 Gastro-esophageal reflux disease with esophagitis, without bleeding: Secondary | ICD-10-CM

## 2021-03-24 DIAGNOSIS — Z79899 Other long term (current) drug therapy: Secondary | ICD-10-CM | POA: Diagnosis not present

## 2021-03-24 DIAGNOSIS — E559 Vitamin D deficiency, unspecified: Secondary | ICD-10-CM | POA: Diagnosis not present

## 2021-03-24 DIAGNOSIS — Z6841 Body Mass Index (BMI) 40.0 and over, adult: Secondary | ICD-10-CM

## 2021-03-24 DIAGNOSIS — E1122 Type 2 diabetes mellitus with diabetic chronic kidney disease: Secondary | ICD-10-CM

## 2021-03-24 DIAGNOSIS — G4733 Obstructive sleep apnea (adult) (pediatric): Secondary | ICD-10-CM

## 2021-03-24 DIAGNOSIS — I10 Essential (primary) hypertension: Secondary | ICD-10-CM | POA: Diagnosis not present

## 2021-03-24 DIAGNOSIS — K760 Fatty (change of) liver, not elsewhere classified: Secondary | ICD-10-CM

## 2021-03-24 DIAGNOSIS — Z125 Encounter for screening for malignant neoplasm of prostate: Secondary | ICD-10-CM

## 2021-03-24 DIAGNOSIS — Z8673 Personal history of transient ischemic attack (TIA), and cerebral infarction without residual deficits: Secondary | ICD-10-CM

## 2021-03-24 DIAGNOSIS — N182 Chronic kidney disease, stage 2 (mild): Secondary | ICD-10-CM

## 2021-03-24 DIAGNOSIS — G4730 Sleep apnea, unspecified: Secondary | ICD-10-CM

## 2021-03-24 DIAGNOSIS — E782 Mixed hyperlipidemia: Secondary | ICD-10-CM

## 2021-03-24 DIAGNOSIS — Z136 Encounter for screening for cardiovascular disorders: Secondary | ICD-10-CM

## 2021-03-24 DIAGNOSIS — D6859 Other primary thrombophilia: Secondary | ICD-10-CM

## 2021-03-24 DIAGNOSIS — E785 Hyperlipidemia, unspecified: Secondary | ICD-10-CM | POA: Diagnosis not present

## 2021-03-24 DIAGNOSIS — I700 Atherosclerosis of aorta: Secondary | ICD-10-CM

## 2021-03-24 MED ORDER — ATORVASTATIN CALCIUM 80 MG PO TABS: ORAL_TABLET | ORAL | 0 refills | Status: DC

## 2021-03-24 MED ORDER — TOPIRAMATE 25 MG PO TABS: 25.0000 mg | ORAL_TABLET | Freq: Every day | ORAL | 1 refills | Status: DC

## 2021-03-24 MED ORDER — VALSARTAN 160 MG PO TABS: ORAL_TABLET | ORAL | 0 refills | Status: DC

## 2021-03-24 MED ORDER — EZETIMIBE 10 MG PO TABS: ORAL_TABLET | ORAL | 1 refills | Status: DC

## 2021-03-24 MED ORDER — FUROSEMIDE 40 MG PO TABS: ORAL_TABLET | ORAL | 0 refills | Status: DC

## 2021-03-24 MED ORDER — OMEPRAZOLE 40 MG PO CPDR: DELAYED_RELEASE_CAPSULE | ORAL | 0 refills | Status: DC

## 2021-03-24 MED ORDER — PHENTERMINE HCL 37.5 MG PO TABS: ORAL_TABLET | ORAL | 0 refills | Status: DC

## 2021-03-24 MED ORDER — OZEMPIC (1 MG/DOSE) 2 MG/1.5ML ~~LOC~~ SOPN: 1.0000 mg | PEN_INJECTOR | SUBCUTANEOUS | 3 refills | Status: DC

## 2021-03-24 MED ORDER — CLOPIDOGREL BISULFATE 75 MG PO TABS
75.0000 mg | ORAL_TABLET | Freq: Every day | ORAL | 1 refills | Status: DC
Start: 2021-03-24 — End: 2021-10-20

## 2021-03-24 MED ORDER — CARVEDILOL 12.5 MG PO TABS: ORAL_TABLET | ORAL | 1 refills | Status: DC

## 2021-03-24 NOTE — Patient Instructions (Signed)
GENERAL HEALTH GOALS   Know what a healthy weight is for you (roughly BMI <25) and aim to maintain this   Aim for 7+ servings of fruits and vegetables daily   70-80+ fluid ounces of water or unsweet tea for healthy kidneys   Limit to max 1 drink of alcohol per day; avoid smoking/tobacco   Limit animal fats in diet for cholesterol and heart health - choose grass fed whenever available   Avoid highly processed foods, and foods high in saturated/trans fats   Aim for low stress - take time to unwind and care for your mental health   Aim for 150 min of moderate intensity exercise weekly for heart health, and weights twice weekly for bone health   Aim for 7-9 hours of sleep daily   Obesity, Adult Obesity is the condition of having too much total body fat. Being overweight or obese means that your weight is greater than what is considered healthy for your body size. Obesity is determined by a measurement called BMI. BMI is an estimate of body fat and is calculated from height and weight. For adults, aBMI of 30 or higher is considered obese. Obesity can lead to other health concerns and major illnesses, including: Stroke. Coronary artery disease (CAD). Type 2 diabetes. Some types of cancer, including cancers of the colon, breast, uterus, and gallbladder. Osteoarthritis. High blood pressure (hypertension). High cholesterol. Sleep apnea. Gallbladder stones. Infertility problems. What are the causes? Common causes of this condition include: Eating daily meals that are high in calories, sugar, and fat. Being born with genes that may make you more likely to become obese. Having a medical condition that causes obesity, including: Hypothyroidism. Polycystic ovarian syndrome (PCOS). Binge-eating disorder. Cushing syndrome. Taking certain medicines, such as steroids, antidepressants, and seizure medicines. Not being physically active (sedentary lifestyle). Not getting enough  sleep. Drinking high amounts of sugar-sweetened beverages, such as soft drinks. What increases the risk? The following factors may make you more likely to develop this condition: Having a family history of obesity. Being a woman of African American descent. Being a man of Hispanic descent. Living in an area with limited access to: [place], recreation centers, or sidewalks. Healthy food choices, such as grocery stores and farmers' markets. What are the signs or symptoms? The main sign of this condition is having too much body fat. How is this diagnosed? This condition is diagnosed based on: Your BMI. If you are an adult with a BMI of 30 or higher, you are considered obese. Your waist circumference. This measures the distance around your waistline. Your skinfold thickness. Your health care provider may gently pinch a fold of your skin and measure it. You may have other tests to check for underlying conditions. How is this treated? Treatment for this condition often includes changing your lifestyle. Treatment may include some or all of the following: Dietary changes. This may include developing a healthy meal plan. Regular physical activity. This may include activity that causes your heart to beat faster (aerobic exercise) and strength training. Work with your health care provider to design an exercise program that works for you. Medicine to help you lose weight if you are unable to lose 1 pound a week after 6 weeks of healthy eating and more physical activity. Treating conditions that cause the obesity (underlying conditions). Surgery. Surgical options may include gastric banding and gastric bypass. Surgery may be done if: Other treatments have not helped to improve your condition. You have a BMI of 40  or higher. You have life-threatening health problems related to obesity. Follow these instructions at home: Eating and drinking  Follow recommendations from your health care provider about  what you eat and drink. Your health care provider may advise you to: Limit fast food, sweets, and processed snack foods. Choose low-fat options, such as low-fat milk instead of whole milk. Eat 5 or more servings of fruits or vegetables every day. Eat at home more often. This gives you more control over what you eat. Choose healthy foods when you eat out. Learn to read food labels. This will help you understand how much food is considered 1 serving. Learn what a healthy serving size is. Keep low-fat snacks available. Limit sugary drinks, such as soda, fruit juice, sweetened iced tea, and flavored milk. Drink enough water to keep your urine pale yellow. Do not follow a fad diet. Fad diets can be unhealthy and even dangerous.  Physical activity Exercise regularly, as told by your health care provider. Most adults should get up to 150 minutes of moderate-intensity exercise every week. Ask your health care provider what types of exercise are safe for you and how often you should exercise. Warm up and stretch before being active. Cool down and stretch after being active. Rest between periods of activity. Lifestyle Work with your health care provider and a dietitian to set a weight-loss goal that is healthy and reasonable for you. Limit your screen time. Find ways to reward yourself that do not involve food. Do not drink alcohol if: Your health care provider tells you not to drink. You are pregnant, may be pregnant, or are planning to become pregnant. If you drink alcohol: Limit how much you use to: 0-1 drink a day for women. 0-2 drinks a day for men. Be aware of how much alcohol is in your drink. In the [place] one drink equals one 12 oz bottle of beer (355 mL), one 5 oz glass of wine (148 mL), or one 1 oz glass of hard liquor (44 mL). General instructions Keep a weight-loss journal to keep track of the food you eat and how much exercise you get. Take over-the-counter and prescription  medicines only as told by your health care provider. Take vitamins and supplements only as told by your health care provider. Consider joining a support group. Your health care provider may be able to recommend a support group. Keep all follow-up visits as told by your health care provider. This is important. Contact a health care provider if: You are unable to meet your weight loss goal after 6 weeks of dietary and lifestyle changes. Get help right away if you are having: Trouble breathing. Suicidal thoughts or behaviors. Summary Obesity is the condition of having too much total body fat. Being overweight or obese means that your weight is greater than what is considered healthy for your body size. Work with your health care provider and a dietitian to set a weight-loss goal that is healthy and reasonable for you. Exercise regularly, as told by your health care provider. Ask your health care provider what types of exercise are safe for you and how often you should exercise. This information is not intended to replace advice given to you by your health care provider. Make sure you discuss any questions you have with your healthcare provider. Document Revised: 05/04/2018 Document Reviewed: 05/04/2018 Elsevier Patient Education  2022 [employer].

## 2021-03-25 LAB — COMPLETE METABOLIC PANEL WITH GFR
AG Ratio: 1.5 (calc) (ref 1.0–2.5)
ALT: 26 U/L (ref 9–46)
AST: 24 U/L (ref 10–35)
Albumin: 4.3 g/dL (ref 3.6–5.1)
Alkaline phosphatase (APISO): 103 U/L (ref 35–144)
BUN: 25 mg/dL (ref 7–25)
CO2: 22 mmol/L (ref 20–32)
Calcium: 9.7 mg/dL (ref 8.6–10.3)
Chloride: 106 mmol/L (ref 98–110)
Creat: 1.35 mg/dL (ref 0.70–1.35)
Globulin: 2.8 g/dL (calc) (ref 1.9–3.7)
Glucose, Bld: 84 mg/dL (ref 65–99)
Potassium: 4.3 mmol/L (ref 3.5–5.3)
Sodium: 139 mmol/L (ref 135–146)
Total Bilirubin: 0.6 mg/dL (ref 0.2–1.2)
Total Protein: 7.1 g/dL (ref 6.1–8.1)
eGFR: 58 mL/min/{1.73_m2} — ABNORMAL LOW (ref >=60)

## 2021-03-25 LAB — CBC WITH DIFFERENTIAL/PLATELET
Absolute Monocytes: 836 cells/uL (ref 200–950)
Basophils Absolute: 62 cells/uL (ref 0–200)
Basophils Relative: 0.7 %
Eosinophils Absolute: 387 cells/uL (ref 15–500)
Eosinophils Relative: 4.4 %
HCT: 45.5 % (ref 38.5–50.0)
Hemoglobin: 14.8 g/dL (ref 13.2–17.1)
Lymphs Abs: 2244 cells/uL (ref 850–3900)
MCH: 26.5 pg — ABNORMAL LOW (ref 27.0–33.0)
MCHC: 32.5 g/dL (ref 32.0–36.0)
MCV: 81.5 fL (ref 80.0–100.0)
MPV: 9.3 fL (ref 7.5–12.5)
Monocytes Relative: 9.5 %
Neutro Abs: 5271 cells/uL (ref 1500–7800)
Neutrophils Relative %: 59.9 %
Platelets: 318 10*3/uL (ref 140–400)
RBC: 5.58 10*6/uL (ref 4.20–5.80)
RDW: 15.3 % — ABNORMAL HIGH (ref 11.0–15.0)
Total Lymphocyte: 25.5 %
WBC: 8.8 10*3/uL (ref 3.8–10.8)

## 2021-03-25 LAB — MICROALBUMIN / CREATININE URINE RATIO
Creatinine, Urine: 264 mg/dL (ref 20–320)
Microalb Creat Ratio: 2 mcg/mg creat (ref <30)
Microalb, Ur: 0.5 mg/dL

## 2021-03-25 LAB — URINALYSIS, ROUTINE W REFLEX MICROSCOPIC
Bilirubin Urine: NEGATIVE
Glucose, UA: NEGATIVE
Hgb urine dipstick: NEGATIVE
Ketones, ur: NEGATIVE
Leukocytes,Ua: NEGATIVE
Nitrite: NEGATIVE
Protein, ur: NEGATIVE
Specific Gravity, Urine: 1.027 (ref 1.001–1.035)
pH: <=5 (ref 5.0–8.0)

## 2021-03-25 LAB — LIPID PANEL
Cholesterol: 148 mg/dL (ref <200)
HDL: 32 mg/dL — ABNORMAL LOW (ref >=40)
LDL Cholesterol (Calc): 81 mg/dL (calc)
Non-HDL Cholesterol (Calc): 116 mg/dL (calc) (ref <130)
Total CHOL/HDL Ratio: 4.6 (calc) (ref <5.0)
Triglycerides: 269 mg/dL — ABNORMAL HIGH (ref <150)

## 2021-03-25 LAB — HEMOGLOBIN A1C
Hgb A1c MFr Bld: 6.2 % of total Hgb — ABNORMAL HIGH (ref <5.7)
Mean Plasma Glucose: 131 mg/dL
eAG (mmol/L): 7.3 mmol/L

## 2021-03-25 LAB — MAGNESIUM: Magnesium: 2 mg/dL (ref 1.5–2.5)

## 2021-03-25 LAB — TSH: TSH: 0.83 mIU/L (ref 0.40–4.50)

## 2021-03-25 LAB — VITAMIN D 25 HYDROXY (VIT D DEFICIENCY, FRACTURES): Vit D, 25-Hydroxy: 55 ng/mL (ref 30–100)

## 2021-03-25 LAB — PSA: PSA: 0.39 ng/mL (ref <=4.00)

## 2021-03-26 ENCOUNTER — Encounter: Payer: Self-pay | Admitting: Nurse Practitioner

## 2021-07-01 ENCOUNTER — Other Ambulatory Visit: Payer: Self-pay | Admitting: Adult Health

## 2021-07-01 DIAGNOSIS — Z79899 Other long term (current) drug therapy: Secondary | ICD-10-CM

## 2021-08-17 ENCOUNTER — Telehealth: Payer: Self-pay | Admitting: Internal Medicine

## 2021-08-17 NOTE — Chronic Care Management (AMB) (Signed)
  Chronic Care Management   Outreach Note  08/17/2021 Name: Dustin Ayala MRN: [id number] DOB: 1955/10/03  Referred by: Unk Pinto, MD Reason for referral : No chief complaint on file.   An unsuccessful telephone outreach was attempted today. The patient was referred to the pharmacist for assistance with care management and care coordination.   Follow Up Plan:   Tatjana Dellinger Upstream Scheduler

## 2021-08-20 ENCOUNTER — Other Ambulatory Visit: Payer: Self-pay | Admitting: Nurse Practitioner

## 2021-08-20 DIAGNOSIS — E785 Hyperlipidemia, unspecified: Secondary | ICD-10-CM

## 2021-08-20 DIAGNOSIS — E1169 Type 2 diabetes mellitus with other specified complication: Secondary | ICD-10-CM

## 2021-08-20 DIAGNOSIS — Z79899 Other long term (current) drug therapy: Secondary | ICD-10-CM

## 2021-08-25 IMAGING — CT CT ABD-PELV W/ CM
2 of 6 series · 15 of 46 positions shown, 17 images · IV contrast (OMNIPAQUE 300)
Comparison: None.

CLINICAL DATA: Rectal pain with blood and purulence near rectum.

EXAM:
CT ABDOMEN AND PELVIS WITH CONTRAST
TECHNIQUE: Multidetector CT imaging of the abdomen and pelvis was performed
using the standard protocol following bolus administration of
intravenous contrast.
CONTRAST:  100mL OMNIPAQUE IOHEXOL 300 MG/ML  SOLN

[Series 2: axial st · axial · 0.98mm/px · z∈[-514,-84]mm · 12 of 102 slices shown, 14 images]
[im 8/102  soft-tissue]
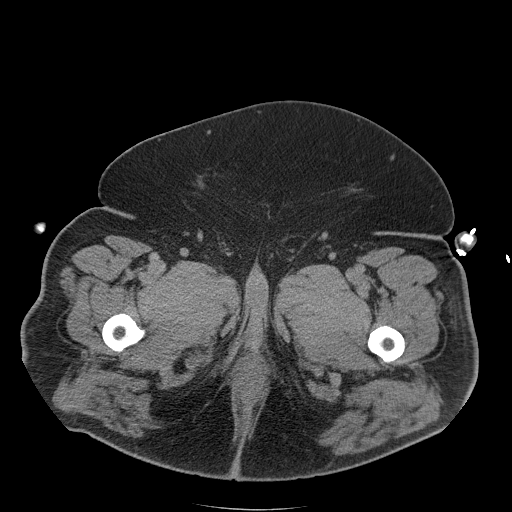
[im 8/102  bone]
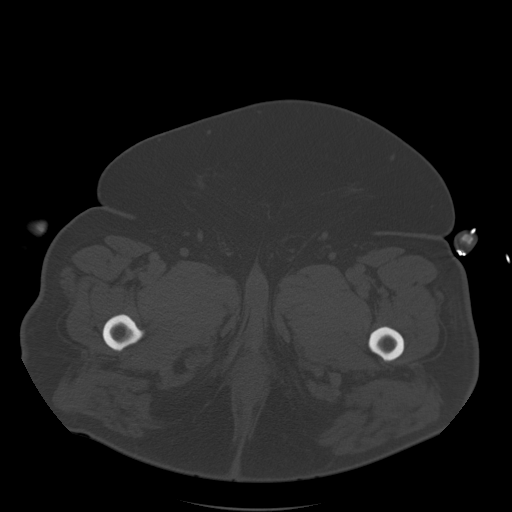
[im 16/102  soft-tissue]
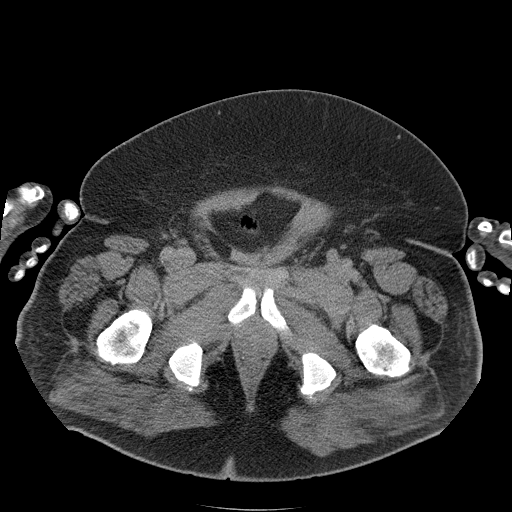
[im 24/102  soft-tissue]
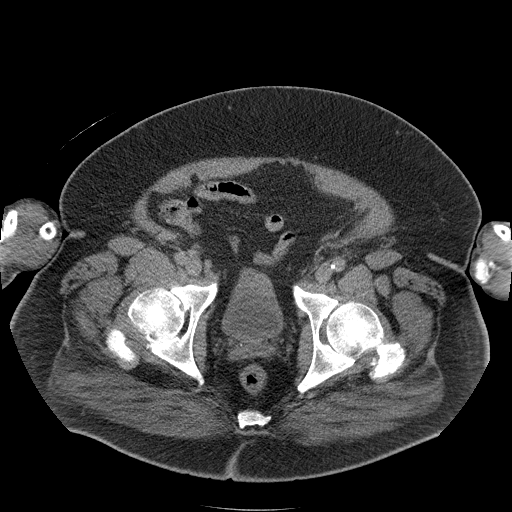
[im 32/102  soft-tissue]
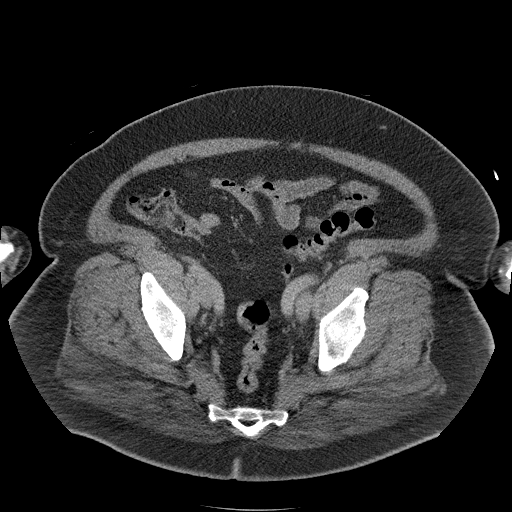
[im 39/102  soft-tissue]
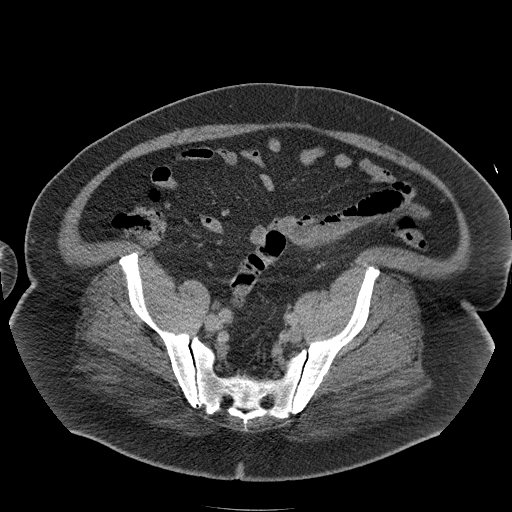
[im 47/102  soft-tissue]
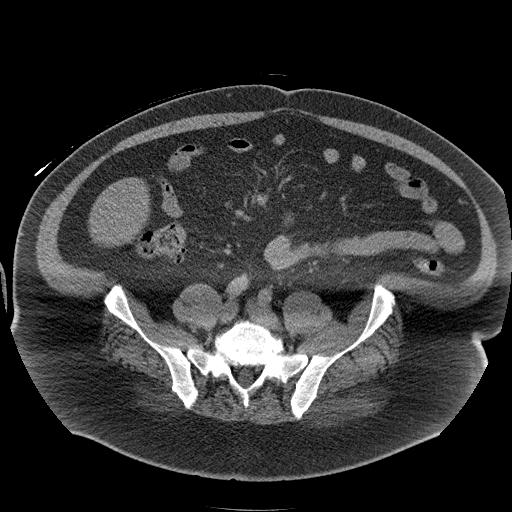
[im 55/102  soft-tissue]
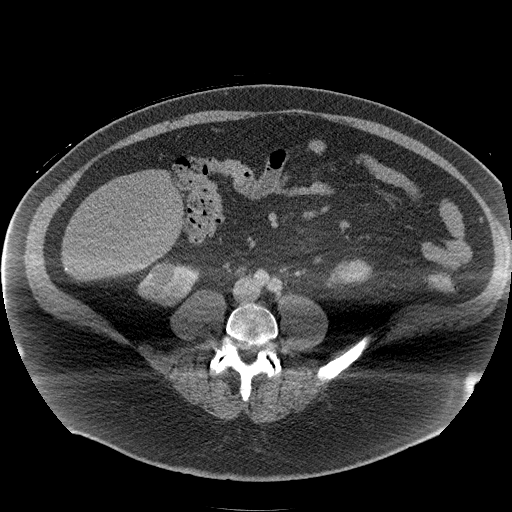
[im 63/102  soft-tissue]
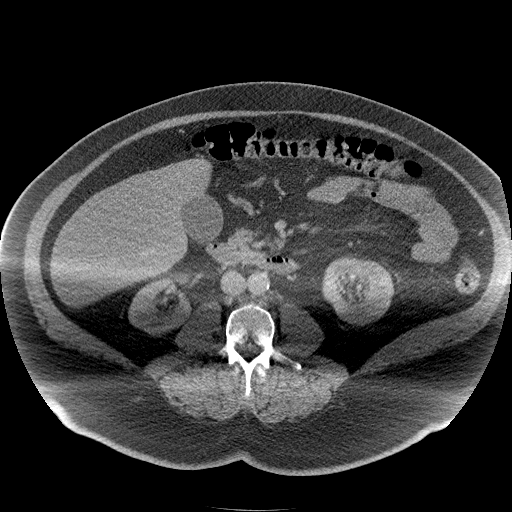
[im 70/102  soft-tissue]
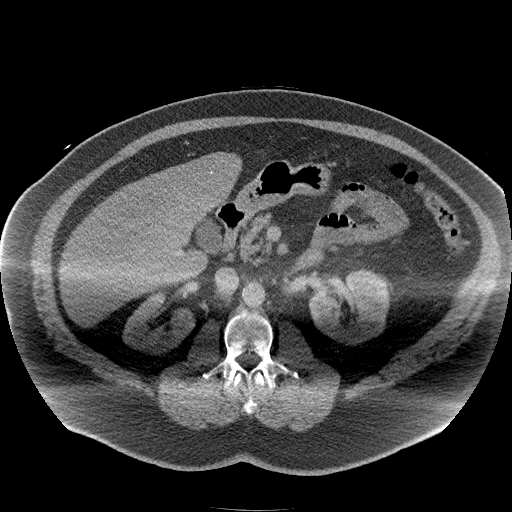
[im 70/102  bone]
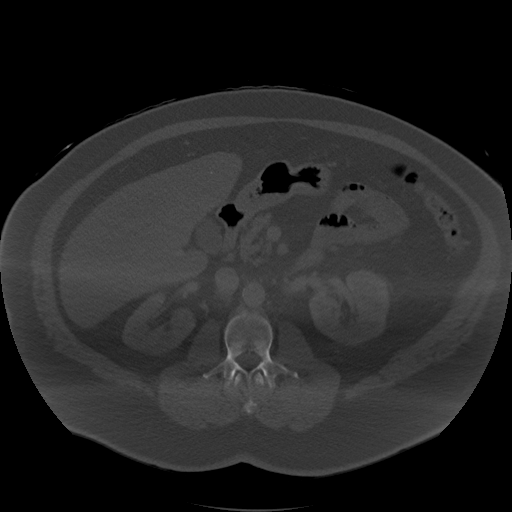
[im 78/102  soft-tissue]
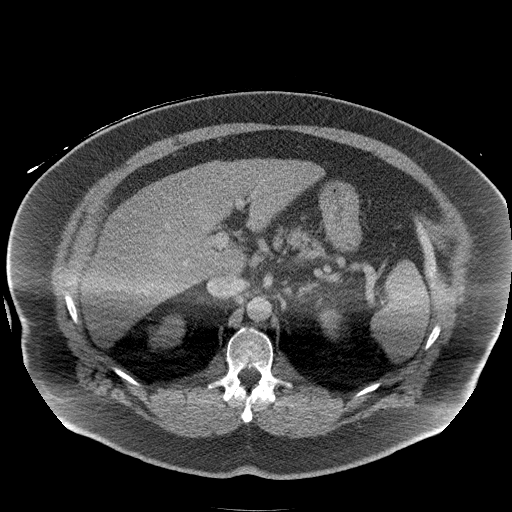
[im 86/102  soft-tissue]
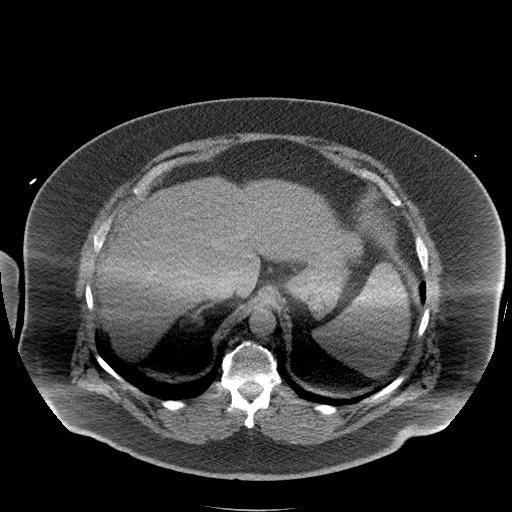
[im 94/102  soft-tissue]
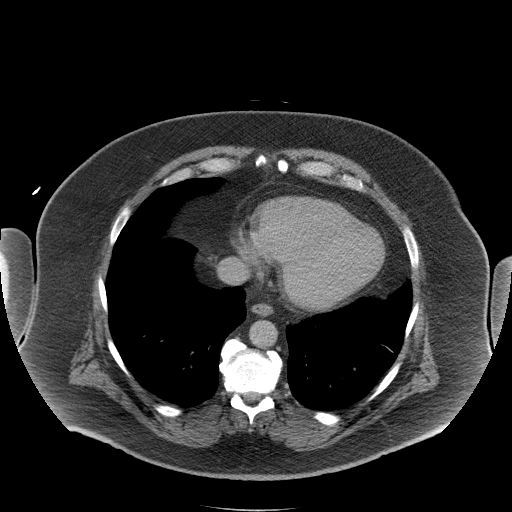

[Series 5: coronal st · coronal · 1.05mm/px · 3 of 169 slices shown]
[im 57/169  soft-tissue]
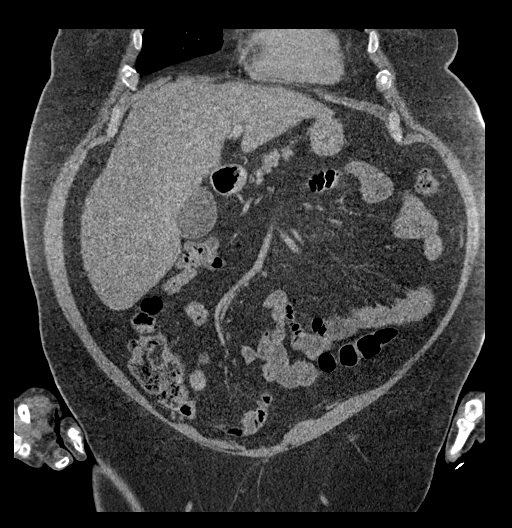
[im 75/169  soft-tissue]
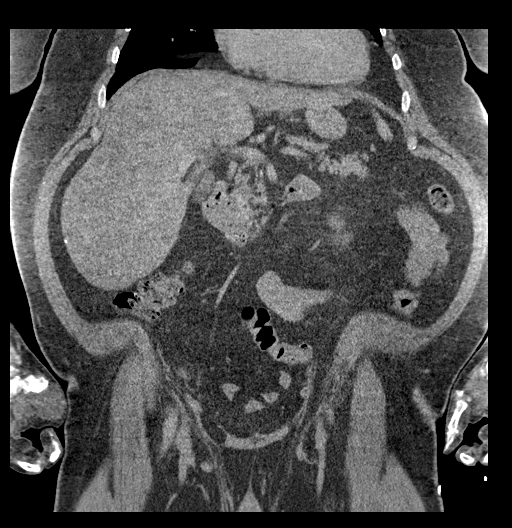
[im 94/169  soft-tissue]
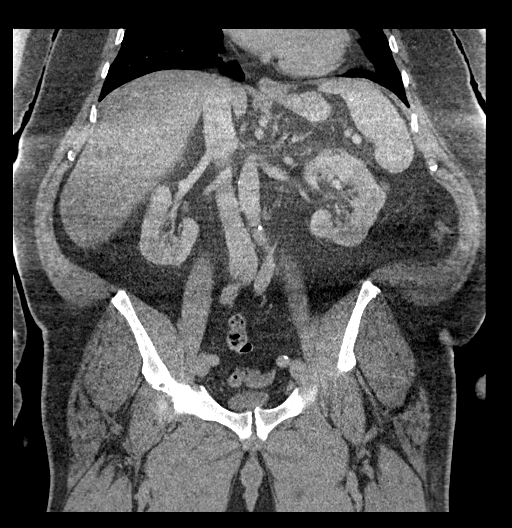

[15 of 46 positions shown; findings below may reference images not displayed]

FINDINGS: Lower chest: Subsegmental atelectasis or scarring in both lower
lobes. No pleural fluid. Calcified lymph node in the inferior left
hilum. Calcified granuloma in the left lower lobe. Upper normal
heart size. No pericardial effusion.

Hepatobiliary: Enlarged liver spanning 24 cm cranial caudal. Diffuse
hepatic steatosis. Few scattered calcified granuloma. No evidence of
focal hepatic lesion. Gallbladder physiologically distended, no
calcified stone. No biliary dilatation.

Pancreas: No ductal dilatation or inflammation.

Spleen: Normal in size. Scattered calcified granuloma.

Adrenals/Urinary Tract: Normal adrenal glands. No hydronephrosis or
perinephric edema. Homogeneous renal enhancement with symmetric
excretion on delayed phase imaging. 2.2 cm low-density lesion in the
lower right kidney is likely cyst, evaluation partially obscured by
streak artifact related to habitus abutting the CT gantry. Urinary
bladder is near completely empty.

Stomach/Bowel: Inflammatory changes about the inferior right gluteal
crease with small focus of air, series 3, image 5, no drainable
fluid collection. There is no definite connection to the anus or
rectum. No rectal wall thickening. Small volume of colonic stool
with scattered diverticulosis. No diverticulitis. No small bowel
obstruction or inflammation. Diminutive appendix tentatively
visualized. No evidence of appendicitis.

Vascular/Lymphatic: Mild aortic atherosclerosis. No aortic aneurysm.
No evidence of portal vein thrombosis. No enlarged lymph nodes in
the abdomen or pelvis.

Reproductive: Prostate is unremarkable.

Other: Inflammatory changes about the inferior right gluteal crease
is small focus of air, best appreciated on series 3, image 5. No
focal fluid collection. No intra-abdominopelvic fluid collection. No
free air free fluid. Tiny fat containing umbilical hernia. Fat in
both inguinal canals.

Musculoskeletal: Multilevel degenerative change in the spine with
degenerative disc disease and facet hypertrophy. There are no acute
or suspicious osseous abnormalities.
IMPRESSION: 1. Inflammatory changes about the inferior right gluteal crease with
small focus of air, no drainable fluid collection. No definite
connection to the anus or rectum.
2. No other acute findings in the abdomen/pelvis. Incidental
findings of hepatomegaly and hepatic steatosis. Colonic
diverticulosis without diverticulitis.

Aortic Atherosclerosis ([id number]).

## 2021-08-28 ENCOUNTER — Telehealth: Payer: Self-pay | Admitting: Internal Medicine

## 2021-08-28 NOTE — Chronic Care Management (AMB) (Signed)
°  Chronic Care Management   Outreach Note  08/28/2021 Name: Dustin Ayala MRN: [id number] DOB: Jun 25, 1956  Referred by: Unk Pinto, MD Reason for referral : No chief complaint on file.   A second unsuccessful telephone outreach was attempted today. The patient was referred to pharmacist for assistance with care management and care coordination.  Follow Up Plan:   Tatjana Dellinger Upstream Scheduler

## 2021-09-03 ENCOUNTER — Telehealth: Payer: Self-pay | Admitting: Internal Medicine

## 2021-09-03 NOTE — Progress Notes (Signed)
°  Chronic Care Management   Note  09/03/2021 Name: Dustin Ayala MRN: [id number] DOB: 08-12-1956  Dustin Ayala is a 65 y.o. year old male who is a primary care patient of Unk Pinto, MD. I reached out to Jonell Cluck by phone today in response to a referral sent by Dustin Ayala's PCP, Unk Pinto, MD.   Dustin Ayala was given information about Chronic Care Management services today including:  CCM service includes personalized support from designated clinical staff supervised by his physician, including individualized plan of care and coordination with other care providers 24/7 contact phone numbers for assistance for urgent and routine care needs. Service will only be billed when office clinical staff spend 20 minutes or more in a month to coordinate care. Only one practitioner may furnish and bill the service in a calendar month. The patient may stop CCM services at any time (effective at the end of the month) by phone call to the office staff.   Patient agreed to services and verbal consent obtained.   Follow up plan:   Tatjana Secretary/administrator

## 2021-09-23 ENCOUNTER — Encounter: Payer: Self-pay | Admitting: Nurse Practitioner

## 2021-09-24 NOTE — Telephone Encounter (Signed)
Please schedule appointment to discuss weight

## 2021-10-01 NOTE — Progress Notes (Deleted)
Assessment and Plan:  There are no diagnoses linked to this encounter.    Further disposition pending results of labs. Discussed med's effects and SE's.   Over 30 minutes of exam, counseling, chart review, and critical decision making was performed.   Future Appointments  Date Time Provider [place]  10/05/2021  3:30 PM Magda Bernheim, NP [facility] None  10/07/2021  3:00 PM Newton Pigg, [facility] None  03/24/2022  2:00 PM Magda Bernheim, NP [facility] None    ------------------------------------------------------------------------------------------------------------------   HPI There were no vitals taken for this visit. 66 y.o.male presents for  Past Medical History:  Diagnosis Date   Aortic atherosclerosis ([place])    GERD (gastroesophageal reflux disease)    Hepatic steatosis 11/15/2019   Hepatomegaly 11/15/2019   Hypertension    Microcytic anemia    Morbid obesity ([facility])    Sleep apnea    no cpap   TIA (transient ischemic attack) 2017   Type 2 diabetes mellitus with hyperlipidemia ([facility])    Vitamin D deficiency      Allergies  Allergen Reactions   Sulfamethoxazole-Trimethoprim Rash    Current Outpatient Medications on File Prior to Visit  Medication Sig   atorvastatin (LIPITOR) 80 MG tablet TAKE 1 TABLET BY MOUTH EVERY DAY FOR CHOLESTEROL   carvedilol (COREG) 12.5 MG tablet Take 1 tablet  2 x /day (every 12 hours)  for BP & Heart   Cholecalciferol 125 MCG (5000 UT) TABS Take 5,000 Units by mouth daily.    clopidogrel (PLAVIX) 75 MG tablet Take 1 tablet (75 mg total) by mouth daily.   Coenzyme Q10 200 MG capsule Take 200 mg by mouth daily before breakfast.   ezetimibe (ZETIA) 10 MG tablet TAKE 1 TABLET BY MOUTH EVERY DAY FOR CHOLESTEROL   furosemide (LASIX) 40 MG tablet Take 1 tablet daily for BP & Fluid Retention / Ankle Swelling   glucose blood (ONETOUCH VERIO) test strip Check blood sugar 1 time a day-E11.22.   omeprazole (PRILOSEC) 40 MG capsule Take        1 capsule       Daily         to Prevent Heart burn & Indigestion   phentermine (ADIPEX-P) 37.5 MG tablet TAKE 1 TABLET DAILY FOR DIETING & WEIGHT LOSS   Semaglutide, 1 MG/DOSE, (OZEMPIC, 1 MG/DOSE,) 2 MG/1.5ML SOPN Inject 1 mg into the skin once a week. Inject into the skin.   topiramate (TOPAMAX) 25 MG tablet Take 1 tablet (25 mg total) by mouth daily.   valsartan (DIOVAN) 160 MG tablet Take 1 tablet daily for BP & Diabetic Kidney Protection   vitamin B-12 (CYANOCOBALAMIN) 500 MCG tablet Take 1,000 mcg by mouth daily. Not started   No current facility-administered medications on file prior to visit.    ROS: all negative except above.   Physical Exam:  There were no vitals taken for this visit.  General Appearance: Well nourished, in no apparent distress. Eyes: PERRLA, EOMs, conjunctiva no swelling or erythema Sinuses: No Frontal/maxillary tenderness ENT/Mouth: Ext aud canals clear, TMs without erythema, bulging. No erythema, swelling, or exudate on post pharynx.  Tonsils not swollen or erythematous. Hearing normal.  Neck: Supple, thyroid normal.  Respiratory: Respiratory effort normal, BS equal bilaterally without rales, rhonchi, wheezing or stridor.  Cardio: RRR with no MRGs. Brisk peripheral pulses without edema.  Abdomen: Soft, + BS.  Non tender, no guarding, rebound, hernias, masses. Lymphatics: Non tender without lymphadenopathy.  Musculoskeletal: Full ROM, 5/5 strength, normal gait.  Skin: Warm, dry without rashes, lesions, ecchymosis.  Neuro: Cranial nerves intact. Normal muscle tone, no cerebellar symptoms. Sensation intact.  Psych: Awake and oriented X 3, normal affect, Insight and Judgment appropriate.     Magda Bernheim, NP 3:27 PM [facility]

## 2021-10-05 ENCOUNTER — Ambulatory Visit: Payer: PPO | Admitting: Nurse Practitioner

## 2021-10-05 ENCOUNTER — Telehealth: Payer: Self-pay

## 2021-10-05 NOTE — Telephone Encounter (Signed)
Called patient today and confirmed upcoming visit with CPP.   Total time spent: 30 minutes

## 2021-10-07 ENCOUNTER — Ambulatory Visit: Payer: PPO | Admitting: Pharmacist

## 2021-10-07 ENCOUNTER — Other Ambulatory Visit: Payer: Self-pay

## 2021-10-07 DIAGNOSIS — Z6841 Body Mass Index (BMI) 40.0 and over, adult: Secondary | ICD-10-CM

## 2021-10-07 DIAGNOSIS — G4733 Obstructive sleep apnea (adult) (pediatric): Secondary | ICD-10-CM

## 2021-10-07 DIAGNOSIS — I10 Essential (primary) hypertension: Secondary | ICD-10-CM

## 2021-10-07 DIAGNOSIS — Z79899 Other long term (current) drug therapy: Secondary | ICD-10-CM

## 2021-10-07 DIAGNOSIS — E785 Hyperlipidemia, unspecified: Secondary | ICD-10-CM

## 2021-10-07 DIAGNOSIS — N182 Chronic kidney disease, stage 2 (mild): Secondary | ICD-10-CM

## 2021-10-09 NOTE — Progress Notes (Signed)
Pharmacist Visit  TAJEE, SAVANT [id number] 71 years, Male  DOB: [birth year]  M: [phone] Care Team: Rhys Martini, Cire Deyarmin  __________________________________________________ Clinical Summary Situation:: Dustin Ayala is a 66 year old male who present for initial CCM visit. His chief concerns today are weight loss and cost of Ozempic. Background:: Patient copay for Ozempic had increased to $215 dollars for a 30 day supply. Pt is unwilling to pay this much moving forward. Pt has been having issues with weight loss as well. He discontinued phentermine 1.5 months ago due to inability to keep weight off. Pt is active at work but not currently participating in any exercise program. Assessment:: Although patients diabetes is controlled patient will no longer be able to afford Ozempic. Patient weight loss is uncontrolled. Pt sleep is uncontrolled. Has not tried anything for sleep. Recommendations:: Filled out patient assistance form for Ozempic. Recommended increasing Ozempic to 2mg  weekly for weight loss benefit. Patient will be enrolled in [facility] for packaging and delivery benefit Discussed importance of increasing exercise. Encouraged patient to start silver sneakers program. Recommended patient follow with sleep neurologist and try low dose melatonin nightly to aide with sleep Attestation Statement:: CCM Services:  This encounter meets complex CCM services and moderate to high medical decision making.  Prior to outreach and patient consent for Chronic Care Management, I referred this patient for services after reviewing the nominated patient list or from a personal encounter with the patient.  I have personally reviewed this encounter including the documentation in this note and have collaborated with the care management provider regarding care management and care coordination activities to include development and update of the comprehensive care plan I am certifying that I  agree with the content of this note and encounter as supervising physician.   Visit Details Patient scheduled for CCM visit with the clinical pharmacist.  Patient is referred for CCM by their PCP and CPP is under general PCP supervision.: At least 2 of these conditions are expected to last 12 months or longer and patient is at significant risk for acute exacerbations and/or functional decline.  Patient has consented to participation in CCM program.  Chronic Conditions Patient's Chronic Conditions: Hypertension (HTN), Gastroesophageal Reflux Disease (GERD), Diabetes (DM), Chronic Kidney Disease (CKD), Anemia, Venous insufficiency, Aortic atherosclerosis, TIA, OSA, Hepatic steatosis, Hepatomegaly, Thrombophilia, Vitamin D deficiency  Doctor and Hospital Visits Were there PCP Visits in last 6 months?: No Were there Specialist Visits in last 6 months?: No Was there a Hospital Visit in last 30 days?: No Were there other Hospital Visits in last 6 months?: No  Medication Information Are there any Medication discrepancies?: Yes Details:  Atorvastatin 80mg -08/20/21 (&gt;5 days gap) [facility] Phentermine 37.5-10/20/22 (&gt;5 days gap) [facility] Valsartan 160mg -03/24/21 (&gt;5 days gap) [facility] Ezetimibe 10mg -03/24/21 (&gt;5 days gap) [facility] Clopidogrel 75mg -03/24/21 (&gt;5 days gap) [facility] Carvedilol 12.5mg -03/24/21 (&gt;5 days gap) [facility] Furosemide 40mg -03/24/21 (&gt;5 days gap) [facility]DS [facility] Omeprazole 40mg -03/24/21 (&gt;5 days gap) [facility] Ozempic 1mg -03/24/21 (&gt;5 days gap) [facility] Topamax 25mg -03/24/21 (&gt;5 days gap) [facility]  Are there any Medication adherence gaps (beyond 5 days past due)?: Yes Details:  Atorvastatin 80mg -08/20/21 (&gt;5 days gap) [facility] Phentermine 37.5-10/20/22 (&gt;5 days gap) [facility] Valsartan 160mg -03/24/21 (&gt;5 days gap) 180DS by [facility] Ezetimibe 10mg -03/24/21 (&gt;5 days gap) 90DS by [facility] Clopidogrel 75mg -03/24/21 (&gt;5  days gap) [facility] by [facility] Carvedilol 12.5mg -03/24/21 (&gt;5 days gap) 90DS by  [facility] Furosemide 40mg -03/24/21 (&gt;5 days gap) 180DS by [facility] Omeprazole 40mg -03/24/21 (&gt;5 days gap) [facility] Ozempic 1mg -03/24/21 (&gt;5 days gap) by [facility] Topamax 25mg -03/24/21 (&gt;5 days gap) [facility]  Medication adherence rates for the STAR rating drugs:  Atorvastatin 80mg -08/20/21 90DS Valsartan 160mg -03/24/21  180DS Carvedilol 12.5mg -03/24/21  90DS  List Patient's current Care Gaps: Need A1c Documented, AWV Not performed  Pre-Call Questions [facility]) Are you able to connect with Patient: Yes Visit Type: Phone Call May we confirm what is the best phone # for the pharmacist to call you?: [phone] Confirm visit date/time: 1/25 at 3:00 Have you seen any other providers since your last visit?: No Any changes in your medicines or health?: No Any side effects from any medications?: No Do you have any symptoms or problems not managed by your medications?: Yes Details: Phentermine 37.5 was taking Topamax What are your top 2 health concerns right now?:  Keep weight off was down to 290 and pt. is close to 300. AC1 was getting 6.2 last year cost of Ozempic  What are your top 2 medication concerns right now?: Other Details: No medication concerns at this time. 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  What are your top 1 or 2 goals for your health?:  Weight loss  What are you doing to try to reach these goals?: Exercise, Taking medications, Monitoring weight, Diet What, if any, problems do you have getting your medications from the pharmacy?: None Is there anything else that you would like to discuss during the appointment?: No  Disease Assessments Current BP: 110/78 Current HR: 67 taken on: 03/23/2021 Previous BP:  134/80 Previous HR: 67 taken on: 10/22/2020 Weight: 301 BMI: 43.19 Last GFR: 58 taken on: 03/23/2021 Why did the patient present?: CCM initial visit Marital status?: Married Details: Taysom Glymph 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 Who does the patient spend their time with and what do they do?: Wife, Sister lives closeby, and daughter Lifestyle habits such as diet and exercise?: Exercise: No Diet: Depends. Usually always eats breakfast supper dinner. Skips lunch sometimes Coffee: None Water: 3-4 bottles of water per day Alcohol, tobacco, and illicit drug usage?: Alcohol: none Tobacco: none Illegal drug use: none What is the patient's sleep pattern?: Other (with free form text), Trouble staying asleep, Trouble going back to sleep after waking up Details: Tried CPAP and BiPap. Masks have been uncomfortable and pt cannot adjust to it. Neuropathy has kept patient awake. Wakes up to urinate 2 times a night How many hours per night does patient typically sleep?: 4 hours, 5-6 hours on a good day Patient pleased with health care they are receiving?: Yes Family, occupational, and living circumstances relevant to overall health?: His family history includes COPD in his mother and sister; Cancer in his mother; Cancer (age of onset: 43) in his father; Colon cancer in his father; Crohn's disease in his son; Diabetes in his father; Heart disease in his father; Hyperlipidemia in his father and mother; Hypertension in his father and mother; Osteoporosis in his mother; Prostate cancer in his father. Factors that may affect medication adherence?: Prior treatment failures Name and  location of Current pharmacy: [id number] [place], [place] [address] Current Rx insurance plan: HTA Are meds synced by current pharmacy?: Yes Are meds delivered by current  pharmacy?: No - delivery available but patient prefers to not use Would patient benefit from direct intervention of clinical lead in dispensing process to optimize clinical outcomes?: Yes Are [facility] services available where patient lives?: Yes Is patient disadvantaged to use [facility]?: No [facility] services reviewed with patient and patient wishes to change pharmacy?: Yes - patient provided verbal consent to transfer prescriptions to [facility].  Patient made aware that CCM team is committed to working with any pharmacy and patient is free to select the pharmacy of their choice that best meets their needs.  Patient also made aware they may discontinue use of [facility] at any time if they feel the service is not meeting their personal needs. Reasons patient wishes to change to [facility]: Packaging, Delivery of medication, Direct intervention of clinical lead in dispensing process to optimize clinical outcomes Dispensing Preference: Packaging 90 DS Any additional demeanor/mood notes?: Senai Kingsley is a 66 year old male who present for initial CCM visit. His chief concerns today are weight loss and cost of Ozempic.  Hypertension (HTN) Assess this condition today?: Yes Is patient able to obtain BP reading today?: No Goal: <130/80 mmHG Hypertension Stage: Normal (SBP <120 and DBP < 80) Is Patient checking BP at home?: Yes Pt. home BP readings are ranging: 115-119/50s How often does patient miss taking their blood pressure medications?: sometimes evening meds. not regular bases Has patient experienced hypotension, dizziness, falls or bradycardia?: No Check present secondary causes (below) for HTN: Obesity, Sleep Apnea, CKD BP RPM device: Does patient qualify?: Yes, but barriers exist for patient Document barriers: No checking often enough We discussed: DASH diet:  following a diet emphasizing fruits and vegetables and low-fat dairy products along  with whole grains, fish, poultry, and nuts. Reducing red meats and sugars., Reducing the amount of salt intake to 1500mg /per day., Targeting 150 minutes of aerobic activity per week Assessment:: Controlled Drug: carvedilol (COREG) 12.5 MG tablet Take 1 tablet 2 x /day (every 12 hours) for BP & Heart Assessment: Appropriate, Effective, Safe, Accessible Drug: furosemide (LASIX) 40 MG tablet Take 1 tablet daily for BP & Fluid Retention / Ankle Swelling, Assessment: Appropriate, Effective, Safe, Accessible Drug: valsartan (DIOVAN) 160 MG tablet Take 1 tablet daily for BP & Diabetic Kidney Protection, Assessment: Appropriate, Effective, Safe, Accessible HC Follow up: N/A Pharmacist Follow up: Assess BP, HR, BMP  Diabetes (DM) Current A1C: 6.2 taken on: 03/23/2021 Previous A1C: 6.4 taken on: 10/22/2020 Type: 2 The current microalbumin ratio is: 2 tested on: 03/23/2021 Last DM Foot Exam on: 12/08/2020 Results: Abnormal/Retinopathy Last DM Eye Exam on: 12/08/2020 Results: Normal/Retinopathy Results: Unknown Assess this condition today?: Yes Goal A1C: < 7.0 % Type: 2 Is Patient taking statin medication?: Yes Is patient taking ACEi / ARB?: Yes DM RPM device: Does patient qualify?: Yes Patient checking Blood Sugar at home?: Yes Does patient use a Continuous Glucose Monitoring (CGM) device?: No How often does patient check Blood Sugars?: ocassionally Recent fasting Blood sugar reading: less than 100. highest 98- 116 mg/dL Has patient experienced any hypoglycemic episodes?: No Diet recall discussed: No We discussed: Modifying lifestyle, including to participate in moderate physical activity (e.g., walking) at least 150 minutes per week., Low carbohydrate eating plan with an emphasis on whole grains, legumes, nuts, fruits, and vegetables and minimal refined  and processed foods., Diet and exercise extensively, How to recognize and treat signs of hypoglycemia. Assessment:: Controlled Drug:  Semaglutide, 1 MG/DOSE, (OZEMPIC, 1 MG/DOSE,) 2 MG/1.5ML SOPN Inject 1 mg into the skin once a week. Inject into the skin Assessment: Appropriate, Effective, Safe, Query Accessibility HC Follow up: N/A Pharmacist Follow up: Assess A1c, FBG, fill PAP for Ozempic  Chronic kidney disease (CKD) Previous GFR: 58 taken on: 03/23/2021 The current microalbumin ratio is: 2 tested on: 03/23/2021 Assess this condition today?: Yes CKD Stage: Stage 3a (GFR 45-60 mL/min) Albuminuria Stage: A1 (<30) Contributing factors for developing CKD: Diabetes, HTN Is Patient taking statin medication: Yes Is patient taking ACEi / ARB?: Yes Renal dose adjustments recommended?: No We discussed: Limiting dietary sodium intake to less than 2000 mg / day, Maintaining blood pressure control, Maintaining blood glucose control, Avoidance of nephrotoxic drugs (NSAIDs), Other Details: Discussed importance of hydration Assessment:: Controlled Drug: valsartan (DIOVAN) 160 MG tablet Take 1 tablet daily for BP & Diabetic Kidney Protection Assessment: Appropriate, Effective, Safe, Accessible HC Follow up: N/A Pharmacist Follow up: Assess GFR, SCr, adjust meds as necessary  Exercise, Diet and Non-Drug Coordination Needs Additional exercise counseling points. We discussed: encouraging participation in insurance-covered exercise program through local gym (i.e. Silver Sneakers), decreasing sedentary behavior, incorporating flexibility, balance, and strength training exercises, targeting at least 150 minutes per week of moderate-intensity aerobic exercise. Additional diet counseling points. We discussed: key components of the DASH diet, aiming to consume at least 8 cups of water day, key components of a low-carb eating plan Discussed Non-Drug Care Coordination Needs: Yes Does Patient have Medication financial barriers?: Yes Patient has insurance  Designer, fashion/clothing): HTA Plan 1 and has trouble affording medications  (list number):  Ozempic Their current copays are: $215 Goal: (pharmacist to fill in plan to resolve) i.e. change medications, samples, PAP.: PAP Patient meets income/out of pocket spend criteria for this medications patient assistance program. Reviewed application process, patient will provide proof of income, out of pocket spend report, and will sign application.  Will collaborate with PCP, and will submit for patient.: Yes  [facility] Health-Related Social Needs Screening Tool -  SDOH  (BloggerBowl.es) What is your living situation today? (ref #1): I have a steady place to live Think about the place you live. Do you have problems with any of the following? (ref #2): None of the above Within the past 12 months, you worried that your food would run out before you got money to buy more (ref #3): Never true Within the past 12 months, the food you bought just didn't last and you didn't have money to get more (ref #4): Never true In the past 12 months, has lack of reliable transportation kept you from medical appointments, meetings, work or from getting things needed for daily living? (ref #5): No In the past 12 months, has the electric, gas, oil, or water company threatened to shut off services in your home? (ref #6): No How often does anyone, including family and friends, physically hurt you? (ref #7): Never (1) How often does anyone, including family and friends, insult or talk down to you? (ref #8): Never (1) How often does anyone, including friends and family, threaten you with harm? (ref #9): Never (1) How often does anyone, including family and friends, scream or curse at you? (ref #10): Never (1)  Engagement Notes Newton Pigg on 10/07/2021 05:46 PM [facility] Chart/ CP prep: 24 Minutes LM-10/05/21 CPP Chart Review: 30 min CPP Office Visit: 81  min CPP Office Visit Documentation: 45 min CPP Coordination of Care: 18 min [facility] Care Plan  Completion: CPP Care Plan Review: 25 min  Engagement Notes Newton Pigg on 10/07/2021 05:46 PM HC F/u: [place] (cost analysis first, Pt has HTA Plan 1)  CPP F/u: 10/19/21: Ok [place] w/ Hinton Dyer 03/24/22: OV w/ dana 06/08/22: CCM f/u visit phone (Insomnia, did melatonin help, Ozempic PAP. Pt sign up for silver sneakers?)  Care Gaps: Pneumonia Vaccine - educated pt Hepatitis C screening - will get at next visit Tetanus/TDAP- educated pt Zoster Vaccine- educated pt Covid Vaccine- educated pt Influenza Vaccine- educated pt Hemoglobin A1c - will get at next visit   Follow up visit: 06/08/2022 3:45pm             Rachelle Hora. Jeannett Senior, PharmD  Clinical Pharmacist  [email or web address]  [phone]

## 2021-10-12 ENCOUNTER — Telehealth: Payer: Self-pay

## 2021-10-12 DIAGNOSIS — I10 Essential (primary) hypertension: Secondary | ICD-10-CM | POA: Diagnosis not present

## 2021-10-12 DIAGNOSIS — E1122 Type 2 diabetes mellitus with diabetic chronic kidney disease: Secondary | ICD-10-CM | POA: Diagnosis not present

## 2021-10-12 DIAGNOSIS — N182 Chronic kidney disease, stage 2 (mild): Secondary | ICD-10-CM | POA: Diagnosis not present

## 2021-10-12 DIAGNOSIS — E1169 Type 2 diabetes mellitus with other specified complication: Secondary | ICD-10-CM | POA: Diagnosis not present

## 2021-10-12 NOTE — Telephone Encounter (Signed)
Called patient to finish pharmacy onboarding form. Patient did not answer. Unable to leave vm. I will call back tomorrow.  Total time spent: 2 minutes Vanetta Shawl, [facility]

## 2021-10-14 NOTE — Progress Notes (Addendum)
FOLLOW UP 3 MONTH  Assessment and Plan:  Jodie was seen today for follow-up.  Diagnoses and all orders for this visit:   Essential hypertension Continue current medications: Carvediolol 12.5mg  BID, Valsartan 160mg  daily, furosemide 40mg  daily Monitor blood pressure at home; call if consistently over 130/80 Continue DASH diet.   Reminder to go to the ER if any CP, SOB, nausea, dizziness, severe HA, changes vision/speech, left arm numbness and tingling and jaw pain. -     CBC with Differential/Platelet -     COMPLETE METABOLIC PANEL WITH GFR  DMII with Hyperlipidemia, (HCC) Continue medications:Atorvastatin 80mg  daily Discussed dietary and exercise modifications Low fat diet -     Lipid panel - TSH  Aortic atherosclerosis ([place])  Per CT 11/15/19 Control blood pressure, cholesterol, glucose, increase exercise.   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 Education: Reviewed ABCs of diabetes management (respective goals in parentheses):  A1C (<7), blood pressure (<130/80), and cholesterol (LDL <70) Dietary recommendations Encouraged aerobic exercise.  Discussed foot care, check daily Yearly retinal exam Dental exam every 6 months Monitor blood glucose, discussed goal for patient -     Hemoglobin A1c  CKD stage 2 due to type 2 diabetes mellitus (HCC) Increase fluids  Avoid NSAIDS Blood pressure control Monitor sugars  Will continue to monitor  Vitamin D deficiency  Continue supplementation to maintain goal of 70-100 Taking Vitamin D 5,000 IU daily Defer  vitamin D level  Morbid Obesity BMI 44.0-44.9, adult (HCC) Discussed dietary and exercise modifications Phentermine is no longer controlling hunger Start Mounjaro 5 mg and will increase dose as needed  Medication management Continued   Venous insufficiency Discussed compression stockings Encouraged continued weight loss Continue to monitor   Further disposition pending results if labs check today. Discussed med's effects and SE's.   Over 30 minutes of face to face interview, exam, counseling, chart review, and critical decision making was performed.    HPI Patient presents for follow up for HTN, chol, DM, morbid obesity, OSA, venous insuff.   He reports overall he is doing well.  He does not have any health or medication concerns today.  He is taking Ozempic 1mg  in skin once a week .   He stopped taking metformin 1,000mg  BID.  His short term goal was to get below 300lbs.  Today he is 308lbs and last OV 309lbs / 322lbs.  He has lost a total of 27lbs.   BMI is Body mass index is 43.88 kg/m., he has been working on diet and exercise. He has OSA and has tried CPAP and BiPAP, has not been able to have one due to feeling of suffocation.  Wt Readings from Last 3 Encounters:  10/19/21 (!) 305 lb 12.8 oz (138.7 kg)  03/24/21 (!) 301 lb (136.5 kg)  10/22/20 (!) 308 lb 12.8 oz (140.1 kg)  He has chronic edema due to his morbid obesity, this had improved with ozempic and weight loss  Had right TLK replacement and repairs to bilateral shoulders. Continues to have pain in right knee and right shoulder.  He moved her from [place] in 2019  to be closer to his daughter, Mariam Dollar.  He had a work up for a possible TIA in 2019, normal echo, normal MRI brain, he is on plavix due to this.   His blood pressure has been controlled at home, today their BP is BP: 112/60 BP Readings from Last 3 Encounters:  10/19/21 112/60  03/24/21 110/78  10/22/20 134/80     He is on cholesterol medication and  denies myalgias. He has been working on diet and exercise for diabetes, With CKD stage 2 With hyperlipidemia on lipitor 80mg  He was on Ozempic but was not able to get that covered on Medicare  he is not on bASA but he is on plavix Carotid doppler 04/2019 less than 50% Retinal hemorrhages due to DM, eye exam 2022 resolved Lab Results  Component Value Date   HGBA1C 6.2 (H) 03/24/2021   Lab Results  Component Value Date   CHOL 148 03/24/2021   HDL 32 (L) 03/24/2021   LDLCALC 81 03/24/2021   TRIG 269 (H) 03/24/2021   CHOLHDL 4.6 03/24/2021   Lab Results  Component Value Date   GFRNONAA 72 10/22/2020     Current Medications:   Current Outpatient Medications (Endocrine & Metabolic):    Semaglutide, 1 MG/DOSE, (OZEMPIC, 1 MG/DOSE,) 2 MG/1.5ML SOPN, Inject 1 mg into the skin once a week. Inject into the skin.  Current Outpatient Medications (Cardiovascular):    atorvastatin (LIPITOR) 80 MG tablet, TAKE 1 TABLET BY MOUTH EVERY DAY FOR CHOLESTEROL   carvedilol (COREG) 12.5 MG tablet, Take 1 tablet  2 x /day (every 12 hours)  for BP & Heart   ezetimibe (ZETIA) 10 MG tablet, TAKE 1 TABLET BY MOUTH EVERY DAY FOR CHOLESTEROL   furosemide (LASIX) 40 MG tablet, Take 1 tablet daily for BP & Fluid Retention / Ankle Swelling   valsartan (DIOVAN) 160 MG tablet, Take 1 tablet daily for BP & Diabetic Kidney Protection    Current Outpatient Medications (Hematological):    clopidogrel (PLAVIX) 75 MG tablet, Take 1 tablet (75 mg total) by mouth daily.   Ferrous Sulfate (IRON) 325 (65 Fe) MG TABS, Take by mouth.  Current Outpatient Medications (Other):    Cholecalciferol 125 MCG (5000 UT) TABS, Take 5,000 Units by mouth daily.    Coenzyme Q10 200 MG capsule, Take 200 mg by mouth daily before breakfast.   Multiple Vitamin (MULTIVITAMIN) tablet, Take 1 tablet by mouth daily.   omeprazole (PRILOSEC) 40 MG capsule, Take       1 capsule       Daily         to Prevent Heart burn & Indigestion    glucose blood (ONETOUCH VERIO) test strip, Check blood sugar 1 time a day-E11.22.   phentermine (ADIPEX-P) 37.5 MG tablet, TAKE 1 TABLET DAILY FOR DIETING & WEIGHT LOSS (Patient not taking: Reported on 10/07/2021)   topiramate (TOPAMAX) 25 MG tablet, Take 1 tablet (25 mg total) by mouth daily. (Patient not taking: Reported on 10/19/2021)  Allergies:  Allergies  Allergen Reactions   Sulfamethoxazole-Trimethoprim Rash   Medical History:  has Morbid obesity ([place]); Type 2 diabetes mellitus with hyperlipidemia ([place]); Vitamin D deficiency; GERD (gastroesophageal reflux disease); OSA (obstructive sleep apnea); Hypertension; CKD stage 2 due to type 2 diabetes mellitus ([place]); Medication management; Venous insufficiency; Fecal occult blood test positive; Microcytic anemia; Aortic atherosclerosis ([place]); Hepatic steatosis; Hepatomegaly; BMI 45.0-49.9, adult ([place]); TIA (transient ischemic attack); and Thrombophilia ([place]) on their problem list.  Surgical History:  He  has  a past surgical history that includes Replacement total knee (Left); Total shoulder replacement (Right); Tonsillectomy; Colonoscopy (2010); and Total shoulder arthroplasty (Left, 02/07/2020). Family History:  Hisfamily history includes COPD in his mother and sister; Cancer in his mother; Cancer (age of onset: 18) in his father; Colon cancer in his father; Crohn's disease in his son; Diabetes in his father; Heart disease in his father; Hyperlipidemia in his father and mother; Hypertension in his father and mother; Osteoporosis in his mother; Prostate cancer in his father. Social History:   reports that he has never smoked. He has never used smokeless tobacco. He reports that he does not currently use alcohol. He reports that he does not use drugs.  Names of Other Physician/Practitioners you currently use: 1. [facility] here for primary care 2. Eye Exam, 2022 3. Dental Exam, [facility] Patient Care Team: Unk Pinto, MD as PCP - General (Internal Medicine) Newton Pigg, [facility] as Pharmacist (Pharmacist)   Screening Tests: Immunization History  Administered Date(s) Administered   PFIZER(Purple Top)SARS-COV-2 Vaccination 12/01/2019, 12/22/2019, 07/21/2020    Preventative care: Last colonoscopy: 3 years, polyps +heme, Recommended VCA Review of Systems:  Review of Systems  Constitutional:  Negative for chills, fever and weight loss.  HENT:  Negative for congestion and hearing loss.   Eyes: Negative.  Negative for blurred vision and double vision.  Respiratory:  Negative for cough, hemoptysis, sputum production, shortness of breath (with walking due to body habitus) and wheezing.   Cardiovascular:  Positive for leg swelling (improved). Negative for chest pain, palpitations, orthopnea and PND.  Gastrointestinal:  Negative for abdominal pain, blood in stool, constipation, diarrhea, heartburn (has hiatal hernia), melena, nausea and vomiting.  Genitourinary: Negative.   Musculoskeletal:  Positive for joint pain (right knee and shoulder). Negative for back pain, falls, myalgias and neck pain.  Skin:  Negative for rash.  Neurological: Negative.  Negative for dizziness, tingling, tremors, loss of consciousness and headaches.  Endo/Heme/Allergies: Negative.  Does not bruise/bleed easily.  Psychiatric/Behavioral: Negative.  Negative for depression, memory loss and suicidal ideas.    Physical Exam: Estimated body mass index is 43.88 kg/m as calculated from the following:   Height as of 03/24/21: 5\' 10"  (1.778 m).   Weight as of this encounter: 305 lb 12.8 oz (138.7 kg). BP 112/60    Pulse 64    Temp (!) 97.5 F (36.4 C)    Wt (!) 305 lb 12.8 oz (138.7 kg)    SpO2 96%    BMI 43.88 kg/m  General Appearance: Obese WM, in no apparent distress.  Eyes: PERRLA, EOMs, conjunctiva no swelling or erythema, normal fundi and vessels.  Sinuses: No Frontal/maxillary tenderness  ENT/Mouth: Ext aud canals clear,  normal light reflex with TMs without erythema, bulging. Mouth and nose not examined- patient wearing a facemask. Hearing normal.  Neck: Supple, thyroid normal. No bruits  Respiratory: Respiratory effort normal, BS decreased due to body habitus but equal bilaterally without rales, rhonchi, wheezing or stridor.  Cardio: RRR without murmurs, rubs or gallops. Brisk DP 2+ but 1+  TP, 1+ edema with improved venous stasis bilateral legs, no warmth, no erythema, no hard cord.  Chest: symmetric, with normal excursions and percussion.  Abdomen: Soft, obese, nontender, no guarding, rebound, hernias, masses, or organomegaly.  Lymphatics: Non tender without lymphadenopathy.  Musculoskeletal: Full ROM all peripheral extremities,antalgic gait, decreased movement right shoulder due to pain.  Skin: Warm, dry. Neuro: Cranial nerves intact, reflexes equal bilaterally. Normal muscle tone, no  cerebellar symptoms. Sensation intact bilateral feet without ulcers. Marland Kitchen  Psych: Awake and oriented X 3, normal affect, Insight and Judgment appropriate.    Magda Bernheim ANP-C  [facility]  10/19/2021

## 2021-10-15 ENCOUNTER — Telehealth: Payer: Self-pay

## 2021-10-15 NOTE — Telephone Encounter (Signed)
Called patient and completed pharmacy onboarding.   Total time spent: 57 minutes Vanetta Shawl, [facility]

## 2021-10-16 ENCOUNTER — Telehealth: Payer: Self-pay

## 2021-10-16 NOTE — Telephone Encounter (Signed)
Called patient to finish pharmacy onboarding. Patient did not pick up Left detailed message to patient to confirm last fill dates for his  Valsartan, Plavix and Carvedilol.  Total time spent: 2 minutes

## 2021-10-19 ENCOUNTER — Other Ambulatory Visit: Payer: Self-pay

## 2021-10-19 ENCOUNTER — Ambulatory Visit (INDEPENDENT_AMBULATORY_CARE_PROVIDER_SITE_OTHER): Payer: PPO | Admitting: Nurse Practitioner

## 2021-10-19 ENCOUNTER — Encounter: Payer: Self-pay | Admitting: Nurse Practitioner

## 2021-10-19 VITALS — BP 112/60 | HR 64 | Temp 97.5°F | Wt 305.8 lb

## 2021-10-19 DIAGNOSIS — Z79899 Other long term (current) drug therapy: Secondary | ICD-10-CM

## 2021-10-19 DIAGNOSIS — E559 Vitamin D deficiency, unspecified: Secondary | ICD-10-CM | POA: Diagnosis not present

## 2021-10-19 DIAGNOSIS — Z794 Long term (current) use of insulin: Secondary | ICD-10-CM

## 2021-10-19 DIAGNOSIS — D6859 Other primary thrombophilia: Secondary | ICD-10-CM | POA: Diagnosis not present

## 2021-10-19 DIAGNOSIS — E1122 Type 2 diabetes mellitus with diabetic chronic kidney disease: Secondary | ICD-10-CM

## 2021-10-19 DIAGNOSIS — I10 Essential (primary) hypertension: Secondary | ICD-10-CM | POA: Diagnosis not present

## 2021-10-19 DIAGNOSIS — I700 Atherosclerosis of aorta: Secondary | ICD-10-CM

## 2021-10-19 DIAGNOSIS — Z8673 Personal history of transient ischemic attack (TIA), and cerebral infarction without residual deficits: Secondary | ICD-10-CM | POA: Diagnosis not present

## 2021-10-19 DIAGNOSIS — E1169 Type 2 diabetes mellitus with other specified complication: Secondary | ICD-10-CM | POA: Diagnosis not present

## 2021-10-19 DIAGNOSIS — E785 Hyperlipidemia, unspecified: Secondary | ICD-10-CM

## 2021-10-19 DIAGNOSIS — Z6841 Body Mass Index (BMI) 40.0 and over, adult: Secondary | ICD-10-CM

## 2021-10-19 DIAGNOSIS — I872 Venous insufficiency (chronic) (peripheral): Secondary | ICD-10-CM | POA: Diagnosis not present

## 2021-10-19 DIAGNOSIS — N182 Chronic kidney disease, stage 2 (mild): Secondary | ICD-10-CM | POA: Diagnosis not present

## 2021-10-19 DIAGNOSIS — E0822 Diabetes mellitus due to underlying condition with diabetic chronic kidney disease: Secondary | ICD-10-CM | POA: Diagnosis not present

## 2021-10-19 MED ORDER — MOUNJARO 5 MG/0.5ML ~~LOC~~ SOAJ: 5.0000 mg | SUBCUTANEOUS | 3 refills | Status: DC

## 2021-10-20 ENCOUNTER — Other Ambulatory Visit: Payer: Self-pay | Admitting: Nurse Practitioner

## 2021-10-20 DIAGNOSIS — E782 Mixed hyperlipidemia: Secondary | ICD-10-CM

## 2021-10-20 DIAGNOSIS — Z79899 Other long term (current) drug therapy: Secondary | ICD-10-CM

## 2021-10-20 DIAGNOSIS — G4730 Sleep apnea, unspecified: Secondary | ICD-10-CM

## 2021-10-20 DIAGNOSIS — E785 Hyperlipidemia, unspecified: Secondary | ICD-10-CM

## 2021-10-20 DIAGNOSIS — I10 Essential (primary) hypertension: Secondary | ICD-10-CM

## 2021-10-20 DIAGNOSIS — E1169 Type 2 diabetes mellitus with other specified complication: Secondary | ICD-10-CM

## 2021-10-20 LAB — CBC WITH DIFFERENTIAL/PLATELET
Absolute Monocytes: 861 cells/uL (ref 200–950)
Basophils Absolute: 53 cells/uL (ref 0–200)
Basophils Relative: 0.5 %
Eosinophils Absolute: 263 cells/uL (ref 15–500)
Eosinophils Relative: 2.5 %
HCT: 36.1 % — ABNORMAL LOW (ref 38.5–50.0)
Hemoglobin: 11.8 g/dL — ABNORMAL LOW (ref 13.2–17.1)
Lymphs Abs: 2384 cells/uL (ref 850–3900)
MCH: 27.7 pg (ref 27.0–33.0)
MCHC: 32.7 g/dL (ref 32.0–36.0)
MCV: 84.7 fL (ref 80.0–100.0)
MPV: 9.8 fL (ref 7.5–12.5)
Monocytes Relative: 8.2 %
Neutro Abs: 6941 cells/uL (ref 1500–7800)
Neutrophils Relative %: 66.1 %
Platelets: 267 10*3/uL (ref 140–400)
RBC: 4.26 10*6/uL (ref 4.20–5.80)
RDW: 13.7 % (ref 11.0–15.0)
Total Lymphocyte: 22.7 %
WBC: 10.5 10*3/uL (ref 3.8–10.8)

## 2021-10-20 LAB — COMPLETE METABOLIC PANEL WITH GFR
AG Ratio: 1.6 (calc) (ref 1.0–2.5)
ALT: 26 U/L (ref 9–46)
AST: 20 U/L (ref 10–35)
Albumin: 4 g/dL (ref 3.6–5.1)
Alkaline phosphatase (APISO): 75 U/L (ref 35–144)
BUN/Creatinine Ratio: 31 (calc) — ABNORMAL HIGH (ref 6–22)
BUN: 30 mg/dL — ABNORMAL HIGH (ref 7–25)
CO2: 24 mmol/L (ref 20–32)
Calcium: 9.3 mg/dL (ref 8.6–10.3)
Chloride: 109 mmol/L (ref 98–110)
Creat: 0.96 mg/dL (ref 0.70–1.35)
Globulin: 2.5 g/dL (calc) (ref 1.9–3.7)
Glucose, Bld: 103 mg/dL — ABNORMAL HIGH (ref 65–99)
Potassium: 4.9 mmol/L (ref 3.5–5.3)
Sodium: 142 mmol/L (ref 135–146)
Total Bilirubin: 0.3 mg/dL (ref 0.2–1.2)
Total Protein: 6.5 g/dL (ref 6.1–8.1)
eGFR: 88 mL/min/{1.73_m2} (ref >=60)

## 2021-10-20 LAB — LIPID PANEL
Cholesterol: 125 mg/dL (ref <200)
HDL: 37 mg/dL — ABNORMAL LOW (ref >=40)
LDL Cholesterol (Calc): 61 mg/dL (calc)
Non-HDL Cholesterol (Calc): 88 mg/dL (calc) (ref <130)
Total CHOL/HDL Ratio: 3.4 (calc) (ref <5.0)
Triglycerides: 209 mg/dL — ABNORMAL HIGH (ref <150)

## 2021-10-20 LAB — HEMOGLOBIN A1C
Hgb A1c MFr Bld: 6.2 % of total Hgb — ABNORMAL HIGH (ref <5.7)
Mean Plasma Glucose: 131 mg/dL
eAG (mmol/L): 7.3 mmol/L

## 2021-10-20 LAB — TSH: TSH: 1.15 mIU/L (ref 0.40–4.50)

## 2021-10-20 MED ORDER — VALSARTAN 160 MG PO TABS: ORAL_TABLET | ORAL | 0 refills | Status: DC

## 2021-10-20 MED ORDER — FUROSEMIDE 40 MG PO TABS: ORAL_TABLET | ORAL | 0 refills | Status: DC

## 2021-10-20 MED ORDER — CARVEDILOL 12.5 MG PO TABS: ORAL_TABLET | ORAL | 1 refills | Status: DC

## 2021-10-20 MED ORDER — EZETIMIBE 10 MG PO TABS: ORAL_TABLET | ORAL | 1 refills | Status: DC

## 2021-10-20 MED ORDER — ATORVASTATIN CALCIUM 80 MG PO TABS: ORAL_TABLET | ORAL | 0 refills | Status: DC

## 2021-10-20 MED ORDER — OMEPRAZOLE 40 MG PO CPDR: DELAYED_RELEASE_CAPSULE | ORAL | 0 refills | Status: DC

## 2021-10-20 MED ORDER — OZEMPIC (2 MG/DOSE) 8 MG/3ML ~~LOC~~ SOPN: 2.0000 mg | PEN_INJECTOR | SUBCUTANEOUS | 3 refills | Status: DC

## 2021-10-20 MED ORDER — CLOPIDOGREL BISULFATE 75 MG PO TABS: 75.0000 mg | ORAL_TABLET | Freq: Every day | ORAL | 1 refills | Status: DC

## 2021-10-20 NOTE — Addendum Note (Signed)
Addended by: Magda Bernheim on: 10/20/2021 04:35 PM   Modules accepted: Orders

## 2021-10-24 ENCOUNTER — Encounter: Payer: Self-pay | Admitting: Nurse Practitioner

## 2021-11-10 DIAGNOSIS — N182 Chronic kidney disease, stage 2 (mild): Secondary | ICD-10-CM | POA: Diagnosis not present

## 2021-11-10 DIAGNOSIS — I10 Essential (primary) hypertension: Secondary | ICD-10-CM | POA: Diagnosis not present

## 2021-11-10 DIAGNOSIS — E1122 Type 2 diabetes mellitus with diabetic chronic kidney disease: Secondary | ICD-10-CM | POA: Diagnosis not present

## 2021-11-10 DIAGNOSIS — E1169 Type 2 diabetes mellitus with other specified complication: Secondary | ICD-10-CM | POA: Diagnosis not present

## 2021-11-18 ENCOUNTER — Other Ambulatory Visit: Payer: Self-pay | Admitting: Nurse Practitioner

## 2021-11-18 DIAGNOSIS — E785 Hyperlipidemia, unspecified: Secondary | ICD-10-CM

## 2021-11-18 DIAGNOSIS — Z79899 Other long term (current) drug therapy: Secondary | ICD-10-CM

## 2021-11-18 DIAGNOSIS — E1169 Type 2 diabetes mellitus with other specified complication: Secondary | ICD-10-CM

## 2022-01-06 ENCOUNTER — Encounter: Payer: Self-pay | Admitting: Nurse Practitioner

## 2022-01-06 DIAGNOSIS — Z79899 Other long term (current) drug therapy: Secondary | ICD-10-CM

## 2022-01-06 DIAGNOSIS — E782 Mixed hyperlipidemia: Secondary | ICD-10-CM

## 2022-01-06 NOTE — Telephone Encounter (Signed)
Can you please call his pharmacy to see if he needs refills

## 2022-01-07 MED ORDER — EZETIMIBE 10 MG PO TABS: ORAL_TABLET | ORAL | 1 refills | Status: DC

## 2022-01-12 ENCOUNTER — Other Ambulatory Visit: Payer: Self-pay | Admitting: Nurse Practitioner

## 2022-01-12 DIAGNOSIS — E1169 Type 2 diabetes mellitus with other specified complication: Secondary | ICD-10-CM

## 2022-01-25 ENCOUNTER — Other Ambulatory Visit: Payer: Self-pay | Admitting: Nurse Practitioner

## 2022-01-25 DIAGNOSIS — Z79899 Other long term (current) drug therapy: Secondary | ICD-10-CM

## 2022-01-25 DIAGNOSIS — G4730 Sleep apnea, unspecified: Secondary | ICD-10-CM

## 2022-01-25 MED ORDER — OMEPRAZOLE 40 MG PO CPDR: DELAYED_RELEASE_CAPSULE | ORAL | 2 refills | Status: DC

## 2022-03-07 ENCOUNTER — Other Ambulatory Visit: Payer: Self-pay | Admitting: Adult Health

## 2022-03-07 DIAGNOSIS — Z79899 Other long term (current) drug therapy: Secondary | ICD-10-CM

## 2022-03-07 DIAGNOSIS — E1169 Type 2 diabetes mellitus with other specified complication: Secondary | ICD-10-CM

## 2022-03-23 NOTE — Progress Notes (Unsigned)
 COMPLETE PHYSICAL Assessment and Plan:  Encounter for general adult medical examination with abnormal findings 1 year  Essential hypertension Decrease Carvedilol to 12.5 mg once a day instead of twice a day - continue medications, DASH diet, exercise and monitor at home. Call if greater than 130/80.   Go to the ER if any chest pain, shortness of breath, nausea, dizziness, severe HA, changes vision/speech   Acquired Thrombophilia (HCC) MONITOR- patient on plavix - Check CBC  Aortic atherosclerosis (HCC) Control blood pressure, cholesterol, glucose, increase exercise.   Morbid obesity (HCC)       Restart TOPAMAX and  PHENTERMINE Continue Ozempic Continue to decrease portion size, Limit Saturated Fats and simple carbs, increase activity.         - follow up 3 months for progress monitoring - increase veggies, decrease carbs - long discussion about weight loss, diet, and exercise   CKD stage 2 due to type 2 diabetes mellitus (HCC) -     COMPLETE METABOLIC PANEL WITH GFR -     Hemoglobin A1c -     EKG 12-Lead  Type 2 diabetes mellitus with hyperlipidemia (HCC) -     Lipid panel -     Hemoglobin A1c -     EKG 12-Lead Discussed general issues about diabetes pathophysiology and management., Educational material distributed., Suggested low cholesterol diet., Encouraged aerobic exercise., Discussed foot care., Reminded to get yearly retinal exam. CONTINUE OZEMPIC, CONTINUE WEIGHT LOSS  Essential hypertension -     COMPLETE METABOLIC PANEL WITH GFR -     TSH       - U/A microalbumin/creat ratio, U/A microscopic -     EKG 12-Lead - continue medications, DASH diet, exercise and monitor at home. Call if greater than 130/80.   Vitamin D deficiency -     VITAMIN D 25 Hydroxy (Vit-D Deficiency, Fractures)  Type 2 diabetes associated with Peripheral neuropathy(HCC) Start Gabapentin 100 mg TID as tolerated If persists can refer to neurology Regularly perform feet checks  Right  thumb pain Will refer to ortho for evaluation  Medication management -     Magnesium  Hepatic steatosis with Hepatomegaly -     COMPLETE METABOLIC PANEL WITH GFR Check labs, avoid tylenol, alcohol, weight loss advised.   Gastroesophageal reflux disease with esophagitis without hemorrhage Continue PPI/H2 blocker, diet discussed -Check Magnesium  OSA (obstructive sleep apnea) Long discussion about how it is harder to manage his chronic co morbidities if he is not treating OSA Continue weight loss   HX of TIA (transient ischemic attack) Continue plavix Control blood pressure, cholesterol, glucose, increase exercise.   BPH with LUTS -     PSA  Screening for ischemic heart disease - EKG  Screening for hematuria/proteinuria - Routine UA with reflex microscopic - Microalbumin/creatinine urine ratio   Discussed med's effects and SE's. Screening labs and tests as requested with regular follow-up as recommended. Over 40 minutes of exam, counseling, chart review and critical decision making was performed  HPI Patient presents for CPE and follow up for HTN, chol, DM, morbid obesity, OSA, venous insuff.   He moved her from [place] in 2019 to be closer to his daughter, Mariam Dollar.  He had a work up for a possible TIA in 2019, normal echo, normal MRI brain, he is on plavix due to this.  He has chronic edema due to his morbid obesity.   He had right shoulder replacement 2019 in [place], following with ortho here, had a reverse shoulder surgery with  Dr. Tamera Punt 01/24/20  Previous Ct showed hepatomegaly and fatty liver.   He has been experiencing pain in his right thumb x several months.  Hurts all the time, will keep him awake at night. Describes as an aching pain that will shoot up his arm at times. Nothing is making better or worse.   He is having numbness, tingling and burning of lower legs from knee down.  He previously received some injections- uncertain of name but only helped for 1  month and was not covered by his insurance.    BMI is Body mass index is 44.56 kg/m., he is working on diet and exercise.  He has OSA and has tried CPAP and BiPAP, has not been able to tolerate due to feeling of suffocation. He does more walking since he has started working part time at [employer]. He is on Ozempic but is not decreasing his appetite currently Wt Readings from Last 3 Encounters:  03/24/22 (!) 315 lb (142.9 kg)  10/19/21 (!) 305 lb 12.8 oz (138.7 kg)  03/24/21 (!) 301 lb (136.5 kg)   His blood pressure has been controlled at home, today their BP is BP: (!) 102/50 He is currently on Carvedilol 12.5 mg BID, Furosemide 40 mg QD, and valsartan 160 mg qd. BP Readings from Last 3 Encounters:  03/24/22 (!) 102/50  10/19/21 112/60  03/24/21 110/78  He does not workout. He denies chest pain, shortness of breath, dizziness.    He is on cholesterol medication, Ezetimibe 10 mg and Atorvastatin 80 mg QD and denies myalgias. He has been working on diet and exercise for diabetes, With CKD stage 2 Has not been checking his blood sugars- his machine broke Ozempic 2 mg SQ QW Lab Results  Component Value Date   HGBA1C 6.2 (H) 10/19/2021    Lab Results  Component Value Date   EGFR 88 10/19/2021     With hyperlipidemia on Lipitor 71m, Zetia 134m He is not on bASA but he is on plavix Carotid doppler 04/2019 less than 50% Retinal exam 03/2021 normal Last Cholesterol LDL was in goal, triglycerides remain elevated  Lab Results  Component Value Date   CHOL 125 10/19/2021   HDL 37 (L) 10/19/2021   LDLCALC 61 10/19/2021   TRIG 209 (H) 10/19/2021   CHOLHDL 3.4 10/19/2021      Current Medications:   Current Outpatient Medications (Endocrine & Metabolic):    OZEMPIC, 2 MG/DOSE, 8 MG/3ML SOPN, INJECT 2 MG INTO THE SKIN ONCE A WEEK.  Current Outpatient Medications (Cardiovascular):    atorvastatin (LIPITOR) 80 MG tablet, TAKE 1 TABLET BY MOUTH EVERY DAY FOR CHOLESTEROL    carvedilol (COREG) 12.5 MG tablet, Take 1 tablet  2 x /day (every 12 hours)  for BP & Heart   ezetimibe (ZETIA) 10 MG tablet, TAKE 1 TABLET BY MOUTH EVERY DAY FOR CHOLESTEROL   furosemide (LASIX) 40 MG tablet, Take 1 tablet daily for BP & Fluid Retention / Ankle Swelling   valsartan (DIOVAN) 160 MG tablet, Take 1 tablet daily for BP & Diabetic Kidney Protection    Current Outpatient Medications (Hematological):    clopidogrel (PLAVIX) 75 MG tablet, Take 1 tablet (75 mg total) by mouth daily.   Ferrous Sulfate (IRON) 325 (65 Fe) MG TABS, Take by mouth.  Current Outpatient Medications (Other):    Cholecalciferol 125 MCG (5000 UT) TABS, Take 5,000 Units by mouth daily.    Coenzyme Q10 200 MG capsule, Take 200 mg by mouth daily before  breakfast.   glucose blood (ONETOUCH VERIO) test strip, Check blood sugar 1 time a day-E11.22.   Multiple Vitamin (MULTIVITAMIN) tablet, Take 1 tablet by mouth daily.   omeprazole (PRILOSEC) 40 MG capsule, Take       1 capsule       Daily         to Prevent Heart burn & Indigestion  Allergies:  Allergies  Allergen Reactions   Sulfamethoxazole-Trimethoprim Rash   Colonoscopy: 01/18/20 Precancerous Polyps follow up 3 years recommended Dr Hilarie Fredrickson Patient Care Team: Unk Pinto, MD as PCP - General (Internal Medicine) Newton Pigg, [facility] as Pharmacist (Pharmacist)  Medical History:  has Morbid obesity ([place]); Type 2 diabetes mellitus with hyperlipidemia ([place]); Vitamin D deficiency; GERD (gastroesophageal reflux disease); OSA (obstructive sleep apnea); Hypertension; CKD stage 2 due to type 2 diabetes mellitus ([place]); Medication management; Venous insufficiency; Fecal occult blood test positive; Microcytic anemia; Aortic atherosclerosis ([place]); Hepatic steatosis; Hepatomegaly; BMI 45.0-49.9, adult ([place]); TIA (transient ischemic attack); and Thrombophilia ([place]) on their problem list. Surgical History:  He  has a past surgical history that includes Replacement total  knee (Left); Total shoulder replacement (Right); Tonsillectomy; Colonoscopy (2010); and Total shoulder arthroplasty (Left, 02/07/2020). Family History:  His family history includes COPD in his mother and sister; Cancer in his mother; Cancer (age of onset: 21) in his father; Colon cancer in his father; Crohn's disease in his son; Diabetes in his father; Heart disease in his father; Hyperlipidemia in his father and mother; Hypertension in his father and mother; Osteoporosis in his mother; Prostate cancer in his father.   Social History:   reports that he has never smoked. He has never used smokeless tobacco. He reports that he does not currently use alcohol. He reports that he does not use drugs. Review of Systems:  Review of Systems  Constitutional: Negative.  Negative for chills, fever, malaise/fatigue and weight loss.  HENT: Negative.  Negative for congestion, ear discharge, hearing loss, sore throat and tinnitus.   Eyes: Negative.  Negative for blurred vision, discharge and redness.  Respiratory:  Negative for cough, hemoptysis, sputum production, shortness of breath (with walking due to body habitus) and wheezing.   Cardiovascular:  Positive for leg swelling (improved). Negative for chest pain, palpitations, orthopnea and PND.  Gastrointestinal:  Negative for abdominal pain, blood in stool, constipation, diarrhea, heartburn (has hiatal hernia), melena, nausea and vomiting.  Genitourinary: Negative.  Negative for dysuria, frequency and urgency.  Musculoskeletal:  Positive for back pain, joint pain (Right thumb) and myalgias. Negative for falls and neck pain.  Skin:  Positive for rash.  Neurological:  Positive for tingling ([place] bilaterally) and sensory change ([place] bilaterally). Negative for dizziness, seizures, weakness and headaches.  Endo/Heme/Allergies: Negative.  Does not bruise/bleed easily.  Psychiatric/Behavioral: Negative.  Negative for depression, hallucinations, memory loss and  suicidal ideas. The patient does not have insomnia.     Physical Exam: Estimated body mass index is 44.56 kg/m as calculated from the following:   Height as of this encounter: 5' 10.5" (1.791 m).   Weight as of this encounter: 315 lb (142.9 kg). BP (!) 102/50   Pulse (!) 56   Temp 97.7 F (36.5 C)   Ht 5' 10.5" (1.791 m)   Wt (!) 315 lb (142.9 kg)   SpO2 95%   BMI 44.56 kg/m  General Appearance: Obese WM, in no apparent distress.  Eyes: PERRLA, EOMs, conjunctiva no swelling or erythema, normal fundi and vessels.  Sinuses: No Frontal/maxillary tenderness  ENT/Mouth:  Ext aud canals clear, normal light reflex with TMs without erythema, bulging. Mouth and nose not examined- patient wearing a facemask. Hearing normal.  Neck: Supple, thyroid normal. No bruits  Respiratory: Respiratory effort normal, BS decreased due to body habitus but equal bilaterally without rales, rhonchi, wheezing or stridor.  Cardio: RRR without murmurs, rubs or gallops. Brisk DP 2+ , 1+ edema with improved venous stasis bilateral legs, no warmth, no erythema, no hard cord.  Chest: symmetric, with normal excursions and percussion.  Abdomen: Soft, obese, nontender, no guarding, rebound, hernias, masses, or organomegaly.  Lymphatics: Non tender without lymphadenopathy.  Genitourinary: defer Musculoskeletal: Full ROM all peripheral extremities,antalgic gait, decreased movement right shoulder due to pain.  Skin: Warm, dry. Neuro: Cranial nerves intact, reflexes equal bilaterally. Normal muscle tone, no cerebellar symptoms. Sensation intact with monofilament bilateral feet without ulcers. Marland Kitchen  Psych: Awake and oriented X 3, normal affect, Insight and Judgment appropriate.  Foot Exam :Performed with monofilament- sensation intact bilaterally EKG: Junctional rhythm, no ST changes.   Lovie Zarling E  2:01 PM [facility]

## 2022-03-24 ENCOUNTER — Ambulatory Visit (INDEPENDENT_AMBULATORY_CARE_PROVIDER_SITE_OTHER): Payer: PPO | Admitting: Nurse Practitioner

## 2022-03-24 ENCOUNTER — Encounter: Payer: Self-pay | Admitting: Nurse Practitioner

## 2022-03-24 VITALS — BP 102/50 | HR 56 | Temp 97.7°F | Ht 70.5 in | Wt 315.0 lb

## 2022-03-24 DIAGNOSIS — E1169 Type 2 diabetes mellitus with other specified complication: Secondary | ICD-10-CM

## 2022-03-24 DIAGNOSIS — Z79899 Other long term (current) drug therapy: Secondary | ICD-10-CM | POA: Diagnosis not present

## 2022-03-24 DIAGNOSIS — D6859 Other primary thrombophilia: Secondary | ICD-10-CM | POA: Diagnosis not present

## 2022-03-24 DIAGNOSIS — Z0000 Encounter for general adult medical examination without abnormal findings: Secondary | ICD-10-CM | POA: Diagnosis not present

## 2022-03-24 DIAGNOSIS — Z125 Encounter for screening for malignant neoplasm of prostate: Secondary | ICD-10-CM

## 2022-03-24 DIAGNOSIS — E0822 Diabetes mellitus due to underlying condition with diabetic chronic kidney disease: Secondary | ICD-10-CM | POA: Diagnosis not present

## 2022-03-24 DIAGNOSIS — G4733 Obstructive sleep apnea (adult) (pediatric): Secondary | ICD-10-CM

## 2022-03-24 DIAGNOSIS — E559 Vitamin D deficiency, unspecified: Secondary | ICD-10-CM

## 2022-03-24 DIAGNOSIS — I10 Essential (primary) hypertension: Secondary | ICD-10-CM | POA: Diagnosis not present

## 2022-03-24 DIAGNOSIS — M79644 Pain in right finger(s): Secondary | ICD-10-CM

## 2022-03-24 DIAGNOSIS — E1142 Type 2 diabetes mellitus with diabetic polyneuropathy: Secondary | ICD-10-CM

## 2022-03-24 DIAGNOSIS — Z0001 Encounter for general adult medical examination with abnormal findings: Secondary | ICD-10-CM

## 2022-03-24 DIAGNOSIS — Z136 Encounter for screening for cardiovascular disorders: Secondary | ICD-10-CM

## 2022-03-24 DIAGNOSIS — N182 Chronic kidney disease, stage 2 (mild): Secondary | ICD-10-CM

## 2022-03-24 DIAGNOSIS — Z8673 Personal history of transient ischemic attack (TIA), and cerebral infarction without residual deficits: Secondary | ICD-10-CM

## 2022-03-24 DIAGNOSIS — Z6841 Body Mass Index (BMI) 40.0 and over, adult: Secondary | ICD-10-CM | POA: Diagnosis not present

## 2022-03-24 DIAGNOSIS — I700 Atherosclerosis of aorta: Secondary | ICD-10-CM

## 2022-03-24 DIAGNOSIS — E785 Hyperlipidemia, unspecified: Secondary | ICD-10-CM | POA: Diagnosis not present

## 2022-03-24 DIAGNOSIS — E1122 Type 2 diabetes mellitus with diabetic chronic kidney disease: Secondary | ICD-10-CM | POA: Diagnosis not present

## 2022-03-24 DIAGNOSIS — N401 Enlarged prostate with lower urinary tract symptoms: Secondary | ICD-10-CM | POA: Diagnosis not present

## 2022-03-24 DIAGNOSIS — Z1329 Encounter for screening for other suspected endocrine disorder: Secondary | ICD-10-CM

## 2022-03-24 DIAGNOSIS — K760 Fatty (change of) liver, not elsewhere classified: Secondary | ICD-10-CM

## 2022-03-24 DIAGNOSIS — Z1389 Encounter for screening for other disorder: Secondary | ICD-10-CM

## 2022-03-24 DIAGNOSIS — K2100 Gastro-esophageal reflux disease with esophagitis, without bleeding: Secondary | ICD-10-CM

## 2022-03-24 MED ORDER — BLOOD GLUCOSE MONITOR KIT: PACK | 0 refills | Status: AC

## 2022-03-24 MED ORDER — GABAPENTIN 100 MG PO CAPS: 100.0000 mg | ORAL_CAPSULE | Freq: Three times a day (TID) | ORAL | 3 refills | Status: DC

## 2022-03-24 MED ORDER — VALSARTAN 160 MG PO TABS: ORAL_TABLET | ORAL | 0 refills | Status: DC

## 2022-03-24 MED ORDER — TOPIRAMATE 50 MG PO TABS: 50.0000 mg | ORAL_TABLET | Freq: Two times a day (BID) | ORAL | 2 refills | Status: DC

## 2022-03-24 MED ORDER — PHENTERMINE HCL 37.5 MG PO TABS
37.5000 mg | ORAL_TABLET | Freq: Every day | ORAL | 2 refills | Status: DC
Start: 2022-03-24 — End: 2023-07-28

## 2022-03-24 NOTE — Patient Instructions (Signed)
Decrease Carvedilol to one time a day Monitor BP if > 140/90 notify the office  Start Topamax twice a day and Phentermine once a day Continue Ozempic  Referral placed to orthopedics for right thumb pain  Gabapentin 100 mg take at bedtime to start if doesn't make sleepy can do three times a day for neuropathy pain in legs  Diabetic Neuropathy Diabetic neuropathy refers to nerve damage that is caused by diabetes. Over time, people with diabetes can develop nerve damage throughout the body. There are several types of diabetic neuropathy: Peripheral neuropathy. This is the most common type of diabetic neuropathy. It damages the nerves that carry signals between the spinal cord and other parts of the body (peripheral nerves). This usually affects nerves in the feet, legs, hands, and arms. Autonomic neuropathy. This type causes damage to nerves that control involuntary functions (autonomic nerves). Involuntary functions are functions of the body that you do not control. They include heartbeat, body temperature, blood pressure, urination, digestion, sweating, sexual function, or response to changes in blood glucose. Focal neuropathy. This type of nerve damage affects one area of the body, such as an arm, a leg, or the face. The injury may involve one nerve or a small group of nerves. Focal neuropathy can be painful and unpredictable. It occurs most often in older adults with diabetes. This often develops suddenly, but usually improves over time and does not cause long-term problems. Proximal neuropathy. This type of nerve damage affects the nerves of the thighs, hips, buttocks, or legs. It causes severe pain, weakness, and muscle death (atrophy), usually in the thigh muscles. It is more common among older men and people who have type 2 diabetes. The length of recovery time may vary. What are the causes? Peripheral, autonomic, and focal neuropathies are caused by diabetes that is not well controlled with  treatment. The cause of proximal neuropathy is not known, but it may be caused by inflammation related to uncontrolled blood glucose levels. What are the signs or symptoms? Peripheral neuropathy Peripheral neuropathy develops slowly over time. When the nerves of the feet and legs no longer work, you may experience: Burning, stabbing, or aching pain in the legs or feet. Pain or cramping in the legs or feet. Loss of feeling (numbness) and inability to feel pressure or pain in the feet. This can lead to: Thick calluses or sores on areas of constant pressure. Ulcers. Reduced ability to feel temperature changes. Foot deformities. Muscle weakness. Loss of balance or coordination. Autonomic neuropathy The symptoms of autonomic neuropathy vary depending on which nerves are affected. Symptoms may include: Problems with digestion, such as: Nausea or vomiting. Poor appetite. Bloating. Diarrhea or constipation. Trouble swallowing. Losing weight without trying to. Problems with the heart, blood, and lungs, such as: Dizziness, especially when standing up. Fainting. Shortness of breath. Irregular heartbeat. Bladder problems, such as: Trouble starting or stopping urination. Leaking urine. Trouble emptying the bladder. Urinary tract infections (UTIs). Problems with other body functions, such as: Sweat. You may sweat too much or too little. Temperature. You might get hot easily. Or, you might feel cold more than usual. Sexual function. Men may not be able to get or maintain an erection. Women may have vaginal dryness and difficulty with arousal. Focal neuropathy Symptoms affect only one area of the body. Common symptoms include: Numbness. Tingling. Burning pain. Prickling feeling. Very sensitive skin. Weakness. Inability to move (paralysis). Muscle twitching. Muscles getting smaller (wasting). Poor coordination. Double or blurred vision. Proximal neuropathy Sudden, severe  pain in the  hip, thigh, or buttocks. Pain may spread from the back into the legs (sciatica). Pain and numbness in the arms and legs. Tingling. Loss of bladder control or bowel control. Weakness and wasting of thigh muscles. Difficulty getting up from a seated position. Abdominal swelling. Unexplained weight loss. How is this diagnosed? Diagnosis varies depending on the type of neuropathy your health care provider suspects. Peripheral neuropathy Your health care provider will do a neurologic exam. This exam checks your reflexes, how you move, and what you can feel. You may have other tests, such as: Blood tests. Tests of the fluid that surrounds the spinal cord (lumbar puncture). CT scan. MRI. Checking the nerves that control muscles (electromyogram, or EMG). Checking how quickly signals pass through your nerves (nerve conduction study). Checking a small piece of a nerve using a microscope (biopsy). Autonomic neuropathy You may have tests, such as: Tests to measure your blood pressure and heart rate. You may be secured to an exam table that moves you from a lying position to an upright position (table tilt test). Breathing tests to check your lungs. Tests to check how food moves through the digestive system (gastric emptying tests). Blood, sweat, or urine tests. Ultrasound of your bladder. Spinal fluid tests. Focal neuropathy This condition may be diagnosed with: A neurologic exam. CT scan. MRI. EMG. Nerve conduction study. Proximal neuropathy There is no test to diagnose this type of neuropathy. You may have tests to rule out other possible causes of this type of neuropathy. Tests may include: X-rays of your spine and lumbar region. Lumbar puncture. MRI. How is this treated? The goal of treatment is to keep nerve damage from getting worse. Treatment may include: Following your diabetes management plan. This will help keep your blood glucose level and your A1C level within your target  range. This is the most important treatment. Using prescription pain medicine. Follow these instructions at home: Diabetes management Follow your diabetes management plan as told by your health care provider. Check your blood glucose levels. Keep your blood glucose in your target range. Have your A1C level checked at least two times a year, or as often as told. Take over the counter and prescription medicines only as told by your health care provider. This includes insulin and diabetes medicine.  Lifestyle  Do not use any products that contain nicotine or tobacco, such as cigarettes, e-cigarettes, and chewing tobacco. If you need help quitting, ask your health care provider. Be physically active every day. Include strength training and balance exercises. Follow a healthy meal plan. Work with your health care provider to manage your blood pressure. General instructions Ask your health care provider if the medicine prescribed to you requires you to avoid driving or using machinery. Check your skin and feet every day for cuts, bruises, redness, blisters, or sores. Keep all follow-up visits. This is important. Contact a health care provider if: You have burning, stabbing, or aching pain in your legs or feet. You are unable to feel pressure or pain in your feet. You develop problems with digestion, such as: Nausea. Vomiting. Bloating. Constipation. Diarrhea. Abdominal pain. You have difficulty with urination, such as: Inability to control when you urinate (incontinence). Inability to completely empty the bladder (retention). You feel as if your heart is racing (palpitations). You feel dizzy, weak, or faint when you stand up. Get help right away if: You cannot urinate. You have sudden weakness or loss of coordination. You have trouble speaking. You have pain  or pressure in your chest. You have an irregular heartbeat. You have sudden inability to move a part of your body. These  symptoms may represent a serious problem that is an emergency. Do not wait to see if the symptoms will go away. Get medical help right away. Call your local emergency services (911 in the [place] Do not drive yourself to the hospital. Summary Diabetic neuropathy is nerve damage that is caused by diabetes. It can cause numbness and pain in the arms, legs, digestive tract, heart, and other body systems. This condition is treated by keeping your blood glucose level and your A1C level within your target range. This can help prevent neuropathy from getting worse. Check your skin and feet every day for cuts, bruises, redness, blisters, or sores. Do not use any products that contain nicotine or tobacco, such as cigarettes, e-cigarettes, and chewing tobacco. This information is not intended to replace advice given to you by your health care provider. Make sure you discuss any questions you have with your health care provider. Document Revised: 01/10/2020 Document Reviewed: 01/10/2020 Elsevier Patient Education  [place].

## 2022-03-25 LAB — CBC WITH DIFFERENTIAL/PLATELET
Absolute Monocytes: 733 cells/uL (ref 200–950)
Basophils Absolute: 56 cells/uL (ref 0–200)
Basophils Relative: 0.6 %
Eosinophils Absolute: 282 cells/uL (ref 15–500)
Eosinophils Relative: 3 %
HCT: 43.7 % (ref 38.5–50.0)
Hemoglobin: 13.9 g/dL (ref 13.2–17.1)
Lymphs Abs: 2256 cells/uL (ref 850–3900)
MCH: 26.1 pg — ABNORMAL LOW (ref 27.0–33.0)
MCHC: 31.8 g/dL — ABNORMAL LOW (ref 32.0–36.0)
MCV: 82.1 fL (ref 80.0–100.0)
MPV: 9.5 fL (ref 7.5–12.5)
Monocytes Relative: 7.8 %
Neutro Abs: 6072 cells/uL (ref 1500–7800)
Neutrophils Relative %: 64.6 %
Platelets: 274 10*3/uL (ref 140–400)
RBC: 5.32 10*6/uL (ref 4.20–5.80)
RDW: 14.9 % (ref 11.0–15.0)
Total Lymphocyte: 24 %
WBC: 9.4 10*3/uL (ref 3.8–10.8)

## 2022-03-25 LAB — URINALYSIS, ROUTINE W REFLEX MICROSCOPIC
Bilirubin Urine: NEGATIVE
Glucose, UA: NEGATIVE
Hgb urine dipstick: NEGATIVE
Ketones, ur: NEGATIVE
Leukocytes,Ua: NEGATIVE
Nitrite: NEGATIVE
Protein, ur: NEGATIVE
Specific Gravity, Urine: 1.023 (ref 1.001–1.035)
pH: 5.5 (ref 5.0–8.0)

## 2022-03-25 LAB — COMPLETE METABOLIC PANEL WITH GFR
AG Ratio: 1.7 (calc) (ref 1.0–2.5)
ALT: 24 U/L (ref 9–46)
AST: 22 U/L (ref 10–35)
Albumin: 4.4 g/dL (ref 3.6–5.1)
Alkaline phosphatase (APISO): 76 U/L (ref 35–144)
BUN/Creatinine Ratio: 23 (calc) — ABNORMAL HIGH (ref 6–22)
BUN: 28 mg/dL — ABNORMAL HIGH (ref 7–25)
CO2: 28 mmol/L (ref 20–32)
Calcium: 9.9 mg/dL (ref 8.6–10.3)
Chloride: 106 mmol/L (ref 98–110)
Creat: 1.24 mg/dL (ref 0.70–1.35)
Globulin: 2.6 g/dL (calc) (ref 1.9–3.7)
Glucose, Bld: 89 mg/dL (ref 65–99)
Potassium: 5.3 mmol/L (ref 3.5–5.3)
Sodium: 143 mmol/L (ref 135–146)
Total Bilirubin: 0.5 mg/dL (ref 0.2–1.2)
Total Protein: 7 g/dL (ref 6.1–8.1)
eGFR: 64 mL/min/{1.73_m2} (ref >=60)

## 2022-03-25 LAB — LIPID PANEL
Cholesterol: 148 mg/dL (ref <200)
HDL: 38 mg/dL — ABNORMAL LOW (ref >=40)
LDL Cholesterol (Calc): 80 mg/dL (calc)
Non-HDL Cholesterol (Calc): 110 mg/dL (calc) (ref <130)
Total CHOL/HDL Ratio: 3.9 (calc) (ref <5.0)
Triglycerides: 199 mg/dL — ABNORMAL HIGH (ref <150)

## 2022-03-25 LAB — MICROALBUMIN / CREATININE URINE RATIO
Creatinine, Urine: 157 mg/dL (ref 20–320)
Microalb, Ur: <0.2 mg/dL

## 2022-03-25 LAB — TSH: TSH: 0.78 mIU/L (ref 0.40–4.50)

## 2022-03-25 LAB — HEMOGLOBIN A1C
Hgb A1c MFr Bld: 6.3 % of total Hgb — ABNORMAL HIGH (ref <5.7)
Mean Plasma Glucose: 134 mg/dL
eAG (mmol/L): 7.4 mmol/L

## 2022-03-25 LAB — VITAMIN D 25 HYDROXY (VIT D DEFICIENCY, FRACTURES): Vit D, 25-Hydroxy: 50 ng/mL (ref 30–100)

## 2022-03-25 LAB — MAGNESIUM: Magnesium: 2 mg/dL (ref 1.5–2.5)

## 2022-03-25 LAB — PSA: PSA: 0.28 ng/mL (ref <=4.00)

## 2022-04-01 DIAGNOSIS — M1811 Unilateral primary osteoarthritis of first carpometacarpal joint, right hand: Secondary | ICD-10-CM | POA: Diagnosis not present

## 2022-04-12 ENCOUNTER — Telehealth: Payer: Self-pay | Admitting: Nurse Practitioner

## 2022-04-12 ENCOUNTER — Other Ambulatory Visit: Payer: Self-pay

## 2022-04-12 DIAGNOSIS — E1169 Type 2 diabetes mellitus with other specified complication: Secondary | ICD-10-CM

## 2022-04-12 MED ORDER — OZEMPIC (2 MG/DOSE) 8 MG/3ML ~~LOC~~ SOPN: 2.0000 mg | PEN_INJECTOR | SUBCUTANEOUS | 1 refills | Status: DC

## 2022-04-12 NOTE — Telephone Encounter (Signed)
Pt has called multiple different pharmacies and they said that they all have his dose of Ozempic on back order. Wanting to know what he can do.

## 2022-04-13 ENCOUNTER — Encounter: Payer: Self-pay | Admitting: Nurse Practitioner

## 2022-04-14 ENCOUNTER — Other Ambulatory Visit: Payer: Self-pay | Admitting: Nurse Practitioner

## 2022-04-14 DIAGNOSIS — E1142 Type 2 diabetes mellitus with diabetic polyneuropathy: Secondary | ICD-10-CM

## 2022-04-14 MED ORDER — ONETOUCH ULTRASOFT LANCETS MISC: 12 refills | Status: AC

## 2022-04-14 MED ORDER — ONETOUCH VERIO VI STRP: ORAL_STRIP | 12 refills | Status: DC

## 2022-05-04 ENCOUNTER — Other Ambulatory Visit: Payer: Self-pay | Admitting: Nurse Practitioner

## 2022-05-04 DIAGNOSIS — E1169 Type 2 diabetes mellitus with other specified complication: Secondary | ICD-10-CM

## 2022-05-04 DIAGNOSIS — Z79899 Other long term (current) drug therapy: Secondary | ICD-10-CM

## 2022-05-05 ENCOUNTER — Encounter: Payer: Self-pay | Admitting: Nurse Practitioner

## 2022-05-05 ENCOUNTER — Other Ambulatory Visit: Payer: Self-pay | Admitting: Nurse Practitioner

## 2022-05-05 DIAGNOSIS — E1169 Type 2 diabetes mellitus with other specified complication: Secondary | ICD-10-CM

## 2022-05-05 MED ORDER — OZEMPIC (2 MG/DOSE) 8 MG/3ML ~~LOC~~ SOPN: 2.0000 mg | PEN_INJECTOR | SUBCUTANEOUS | 1 refills | Status: DC

## 2022-05-06 DIAGNOSIS — M1811 Unilateral primary osteoarthritis of first carpometacarpal joint, right hand: Secondary | ICD-10-CM | POA: Diagnosis not present

## 2022-05-10 ENCOUNTER — Encounter: Payer: Self-pay | Admitting: Nurse Practitioner

## 2022-05-10 ENCOUNTER — Ambulatory Visit (INDEPENDENT_AMBULATORY_CARE_PROVIDER_SITE_OTHER): Payer: PPO | Admitting: Nurse Practitioner

## 2022-05-10 VITALS — BP 126/68 | HR 61 | Temp 97.9°F | Resp 17 | Ht 70.5 in | Wt 301.0 lb

## 2022-05-10 DIAGNOSIS — K2100 Gastro-esophageal reflux disease with esophagitis, without bleeding: Secondary | ICD-10-CM

## 2022-05-10 DIAGNOSIS — Z8673 Personal history of transient ischemic attack (TIA), and cerebral infarction without residual deficits: Secondary | ICD-10-CM

## 2022-05-10 DIAGNOSIS — N401 Enlarged prostate with lower urinary tract symptoms: Secondary | ICD-10-CM

## 2022-05-10 DIAGNOSIS — K760 Fatty (change of) liver, not elsewhere classified: Secondary | ICD-10-CM

## 2022-05-10 DIAGNOSIS — E1122 Type 2 diabetes mellitus with diabetic chronic kidney disease: Secondary | ICD-10-CM | POA: Diagnosis not present

## 2022-05-10 DIAGNOSIS — E1142 Type 2 diabetes mellitus with diabetic polyneuropathy: Secondary | ICD-10-CM | POA: Diagnosis not present

## 2022-05-10 DIAGNOSIS — Z0001 Encounter for general adult medical examination with abnormal findings: Secondary | ICD-10-CM | POA: Diagnosis not present

## 2022-05-10 DIAGNOSIS — Z79899 Other long term (current) drug therapy: Secondary | ICD-10-CM

## 2022-05-10 DIAGNOSIS — G4733 Obstructive sleep apnea (adult) (pediatric): Secondary | ICD-10-CM | POA: Diagnosis not present

## 2022-05-10 DIAGNOSIS — I700 Atherosclerosis of aorta: Secondary | ICD-10-CM | POA: Diagnosis not present

## 2022-05-10 DIAGNOSIS — R051 Acute cough: Secondary | ICD-10-CM

## 2022-05-10 DIAGNOSIS — D6859 Other primary thrombophilia: Secondary | ICD-10-CM | POA: Diagnosis not present

## 2022-05-10 DIAGNOSIS — I10 Essential (primary) hypertension: Secondary | ICD-10-CM

## 2022-05-10 DIAGNOSIS — R6889 Other general symptoms and signs: Secondary | ICD-10-CM | POA: Diagnosis not present

## 2022-05-10 DIAGNOSIS — N182 Chronic kidney disease, stage 2 (mild): Secondary | ICD-10-CM

## 2022-05-10 DIAGNOSIS — Z0000 Encounter for general adult medical examination without abnormal findings: Secondary | ICD-10-CM

## 2022-05-10 DIAGNOSIS — E1169 Type 2 diabetes mellitus with other specified complication: Secondary | ICD-10-CM | POA: Diagnosis not present

## 2022-05-10 DIAGNOSIS — E559 Vitamin D deficiency, unspecified: Secondary | ICD-10-CM | POA: Diagnosis not present

## 2022-05-10 DIAGNOSIS — E785 Hyperlipidemia, unspecified: Secondary | ICD-10-CM

## 2022-05-10 NOTE — Patient Instructions (Signed)
Rinitis alrgica en adultos Allergic Rhinitis, Adult La rinitis alrgica es una reaccin a alrgenos. Los alrgenos son cosas que pueden causar Nurse, mental health. Esta afeccin afecta el revestimiento del interior de la nariz (membrana mucosa). [employer] tipos de rinitis alrgica: Psychologist, counselling. Este tipo tambin se denomina fiebre del heno. Sucede nicamente durante algunas pocas del ao. [place]. Este tipo puede ocurrir en cualquier momento del ao. Esta afeccin no puede transmitirse de [place] persona a otra (no es contagiosa). Puede ser leve, moderada o muy grave. Puede aparecer a cualquier edad y se puede superar con los [place]. Cules son las causas? Esta afeccin puede ser causada por lo siguiente: El polen que proviene de los rboles, el pasto y las [place]. caros del polvo. Humo. Moho. Humo de vehculos. El pis (orina), la saliva o la caspa de [place]. La caspa son las clulas muertas de la piel de [place]. Qu incrementa el riesgo? Es ms probable que sufra esta afeccin si: Building surveyor en su familia. Tiene problemas como alergias en su familia. Es posible que tenga: Hinchazn en parte de los ojos y los prpados. Asma. Esta afecta la respiracin. Enrojecimiento e hinchazn a Psychologist, forensic piel. Alergias a los alimentos. Cules son los signos o sntomas? El sntoma principal de esta afeccin es la secrecin nasal o el taponamiento nasal (congestin nasal). Otros sntomas pueden incluir: Tos o estornudos. Picazn y lagrimeo en los ojos. Mucosidad que gotea por la parte posterior de la garganta (goteo posnasal). Dificultad para dormir. Cansancio. Dolor de [place]. Dolor de Investment banker, operational. Cmo se trata? No hay cura para esta afeccin. Debe evitar las cosas a las cuales es [place] ayudar a Public house manager los sntomas. Esto puede incluir: Medicamentos que [employer] sntomas de la [place], como los corticoesteroides o los antihistamnicos. Estos pueden  administrarse en forma de inyeccin, aerosol nasal o comprimidos. Evitar las cosas a las cuales es alrgico. Medicamentos que le dan pequeas cantidades de cosas a las que es alrgico a lo largo del [place]. Esto se denomina inmunoterapia. Se realiza si otros tratamientos no son eficaces. Pueden darle lo siguiente: Inyecciones. Medicamentos debajo de Barrister's clerk. Medicamentos ms potentes, si otros tratamientos no son eficaces. Siga estas instrucciones en su casa: Evite los alrgenos [employer] cosas a las que es alrgico y Advertising account planner. Para hacer esto, pruebe lo siguiente: Si tiene Sales promotion account executive del ao: Reemplace las alfombras por pisos de [place], baldosas o vinilo. Las alfombras pueden retener la caspa de las mascotas y [place]. No fume. No permita que fumen en su casa. Cambie los filtros de la calefaccin y del aire acondicionado al menos una vez al mes. Si tiene Radio producer en algunos momentos del ao: [place]. Planee hacer las cosas al aire libre cuando las concentraciones de polen estn [place]. Fjese en las concentraciones de polen antes de planificar cosas para hacer al [employer]. Cuando vuelva al interior, cmbiese de ropa y dchese antes de sentarse en los muebles o en la cama. Si es alrgico a Interior and spatial designer: Mantenga a la mascota fuera de su dormitorio. Pase la aspiradora, barra y limpie el polvo con frecuencia.  Instrucciones generales Use los medicamentos de venta libre y los recetados solamente como se lo haya indicado el mdico. Beba suficiente lquido para Theatre manager el pis (orina) de color amarillo plido. Concurra a todas las visitas de seguimiento como se lo haya indicado el mdico. Esto es importante. Dnde buscar ms informacin  American Academy of Allergy, Asthma & Immunology (Academia Estadounidense de [place], [place] e Inmunologa): www.aaaai.org Comunquese con un mdico si: Tiene fiebre. Tiene tos que no  desaparece. Comienza a emitir un sonido agudo al respirar (sibilancias). Los sntomas lo enlentecen. Los Cytogeneticist las cosas habituales todos [place]. Solicite ayuda de inmediato si: Le falta el aire. Este sntoma puede Sales executive. No espere a ver si el sntoma desaparece. Solicite atencin mdica de inmediato. Comunquese con el servicio de emergencias de su localidad (911 en los [place]). No conduzca por sus propios medios [employer] hospital. Resumen La rinitis alrgica puede tratarse tomando medicamentos y [employer] cosas a las cuales es Air cabin crew. Si tiene Set designer solamente parte del ao, mantenga las ventanas cerradas cuando pueda en esos momentos. Comunquese con el mdico si tiene una fiebre o una tos que no desaparece. Esta informacin no tiene Marine scientist el consejo del mdico. Asegrese de hacerle al mdico cualquier pregunta que tenga. Document Revised: 10/25/2019 Document Reviewed: 10/25/2019 Elsevier Patient Education  [place].

## 2022-05-10 NOTE — Progress Notes (Signed)
ANNUAL WELLNESS  Assessment and Plan:  Annual Wellness Due annually  Essential hypertension Continue medications Coreg, Valsartan Discussed DASH (Dietary Approaches to Stop Hypertension) DASH diet is lower in sodium than a typical American diet. Cut back on foods that are high in saturated fat, cholesterol, and trans fats. Eat more whole-grain foods, fish, poultry, and nuts Remain active and exercise as tolerated daily.  Monitor BP at home-Call if greater than 130/80.  Check CMP/CBC   Acquired Thrombophilia ([place]) MONITOR- patient on plavix Check CBC  Aortic atherosclerosis (HCC) Discussed lifestyle modifications, continue Atorvastatin.  Recommended diet heavy in fruits and veggies, omega 3's. Decrease consumption of animal meats, cheeses, and dairy products. Remain active and exercise as tolerated. Continue to monitor. Check/monitor lipids/TSH   Morbid obesity (HCC) Continues TOPAMAX and  PHENTERMINE Continue Ozempic He is down 15 lb.  Discussed appropriate BMI Goal of losing 1 lb per month. Diet modification. Physical activity. Encouraged/praised to build confidence. Patient wishes to be 295 lb short term goal.  CKD stage 2 due to type 2 diabetes mellitus ([place]) Continue ARB, statin, Plavix Discussed how what you eat and drink can aide in kidney protection. Stay well hydrated. Avoid high salt foods. Avoid NSAIDS. Keep BP and BG well controlled.   Take medications as prescribed. Remain active and exercise as tolerated daily. Maintain weight.  Continue to monitor. Check CMP/GFR/Microablumin   Type 2 diabetes mellitus with hyperlipidemia [facility]) Education: Reviewed 'ABCs' of diabetes management  Discussed goals to be met and/or maintained include A1C (<7) Blood pressure (<130/80) Cholesterol (LDL <70) Continue Eye Exam yearly  Continue Dental Exam Q6 mo Discussed dietary recommendations Discussed Physical Activity recommendations Foot exam  UTD Check/monitor A1C   Vitamin D deficiency At goal Monitor levels VITAMIN D 25 Hydroxy (Vit-D Deficiency, Fractures)  Type 2 diabetes associated with Peripheral neuropathy(HCC) Worse at night when sleeping. Increase Gabapentin to 300 mg HS, may add 100 mg for breakthrough.    Continue to monitor If persists possibly refer to neurology Regularly perform feet checks  Right thumb pain Has had injections which were not helpful. [facility] requesting for him to have surgical intervention. Patient is hesitant. Continue to monitor   Medication management All medications discussed and reviewed in full. All questions and concerns regarding medications addressed.     Hepatic steatosis with Hepatomegaly Avoid tylenol, alcohol, weight loss advised.  Check/monitor CMP  Gastroesophageal reflux disease with esophagitis without hemorrhage No suspected reflux complications ([place]/stricture). Lifestyle modification:  wt loss, avoid meals 2-3h before bedtime. Consider eliminating food triggers:  chocolate, caffeine, EtOH, acid/spicy food.  OSA (obstructive sleep apnea) Unable to tolerate CPAP Long discussion about how it is harder to manage his chronic co morbidities if he is not treating OSA Continue weight loss  HX of TIA (transient ischemic attack) Continue plavix Control blood pressure, cholesterol, glucose, increase exercise.   BPH with LUTS Monitor PSA  Acute Cough Zyrtec samples proved.  Recently had lab work completed and stable-defer until next [place].  Discussed med's effects and SE's. Screening labs and tests as requested with regular follow-up as recommended. Over 40 minutes of exam, counseling, chart review and critical decision making was performed  HPI  Patient presents for annual wellness and follow up for HTN, chol, DM, morbid obesity, OSA, venous insuff.    He moved her from [place] in 2019 to be closer to his daughter, Mariam Dollar.   He is  continuing to work part-time.  This keeps him busy and active.  He does  not follow with Dermatology but has followed in the past.  Has not noticed any new spots, nodules, growths, skin changes.    He had a work up for a possible TIA in 2019, normal echo, normal MRI brain, he is on plavix due to this.   He has chronic edema due to his morbid obesity.   He had right shoulder replacement 2019 in [place], following with ortho here, had a reverse shoulder surgery with Dr. Tamera Punt 01/24/20  Previous Ct showed hepatomegaly and fatty liver.   He has been experiencing pain in his right thumb x several months.  Recently seen [employer].  He is not wishing to have further intervention/surgery at this time.    He is having numbness, tingling and burning of lower legs from knee down.  He was placed on Gabapentin 100 mg TID which has not helped.  He continues to wake at night from pain.   BMI is Body mass index is 42.58 kg/m., he is working on diet and exercise.  He has OSA and has tried CPAP and BiPAP, has not been able to tolerate due to feeling of suffocation. He has lost almost 15 lb in 6 weeks.  Wt Readings from Last 3 Encounters:  05/10/22 (!) 301 lb (136.5 kg)  03/24/22 (!) 315 lb (142.9 kg)  10/19/21 (!) 305 lb 12.8 oz (138.7 kg)   His blood pressure has been controlled at home, today their BP is BP: 126/68 He is currently on Carvedilol 12.5 mg BID, Furosemide 40 mg QD, and valsartan 160 mg qd. BP Readings from Last 3 Encounters:  05/10/22 126/68  03/24/22 (!) 102/50  10/19/21 112/60  He does not workout. He denies chest pain, shortness of breath, dizziness.    He is on cholesterol medication, Ezetimibe 10 mg and Atorvastatin 80 mg QD and denies myalgias. He has been working on diet and exercise for diabetes, With CKD stage 2 Has not been checking his blood sugars.  Ozempic 2 mg SQ QW. Last A1c: Lab Results  Component Value Date   HGBA1C 6.3 (H) 03/24/2022    Lab Results   Component Value Date   EGFR 64 03/24/2022     With hyperlipidemia on Lipitor $RemoveBe'80mg'lVlarrKiz$ , Zetia $Remov'10mg'myJgWD$   He is not on bASA but he is on plavix Carotid doppler 04/2019 less than 50% Retinal exam 03/2021 normal Last Cholesterol LDL was in goal, triglycerides remain elevated  Lab Results  Component Value Date   CHOL 148 03/24/2022   HDL 38 (L) 03/24/2022   LDLCALC 80 03/24/2022   TRIG 199 (H) 03/24/2022   CHOLHDL 3.9 03/24/2022      Current Medications:   Current Outpatient Medications (Endocrine & Metabolic):    Semaglutide, 2 MG/DOSE, (OZEMPIC, 2 MG/DOSE,) 8 MG/3ML SOPN, Inject 2 mg into the skin once a week.  Current Outpatient Medications (Cardiovascular):    atorvastatin (LIPITOR) 80 MG tablet, TAKE 1 TABLET BY MOUTH EVERY DAY FOR CHOLESTEROL   carvedilol (COREG) 12.5 MG tablet, Take 1 tablet  2 x /day (every 12 hours)  for BP & Heart   ezetimibe (ZETIA) 10 MG tablet, TAKE 1 TABLET BY MOUTH EVERY DAY FOR CHOLESTEROL   furosemide (LASIX) 40 MG tablet, Take 1 tablet daily for BP & Fluid Retention / Ankle Swelling   valsartan (DIOVAN) 160 MG tablet, Take 1 tablet daily for BP & Diabetic Kidney Protection    Current Outpatient Medications (Hematological):    clopidogrel (PLAVIX) 75 MG tablet, Take 1 tablet (75 mg total)  by mouth daily.   Ferrous Sulfate (IRON) 325 (65 Fe) MG TABS, Take by mouth.  Current Outpatient Medications (Other):    blood glucose meter kit and supplies KIT, Dispense based on patient and insurance preference. Use up to four times daily as directed.   Cholecalciferol 125 MCG (5000 UT) TABS, Take 5,000 Units by mouth daily.    Coenzyme Q10 200 MG capsule, Take 200 mg by mouth daily before breakfast.   gabapentin (NEURONTIN) 100 MG capsule, Take 1 capsule (100 mg total) by mouth 3 (three) times daily.   glucose blood (ONETOUCH VERIO) test strip, Check blood sugar 1 time a day-E11.22.   Lancets (ONETOUCH ULTRASOFT) lancets, Use as instructed   Multiple Vitamin  (MULTIVITAMIN) tablet, Take 1 tablet by mouth daily.   omeprazole (PRILOSEC) 40 MG capsule, Take       1 capsule       Daily         to Prevent Heart burn & Indigestion   phentermine (ADIPEX-P) 37.5 MG tablet, Take 1 tablet (37.5 mg total) by mouth daily before breakfast.   topiramate (TOPAMAX) 50 MG tablet, TAKE 1 TABLET BY MOUTH TWICE A DAY  Allergies:  Allergies  Allergen Reactions   Sulfamethoxazole-Trimethoprim Rash   Colonoscopy: 01/18/20 Precancerous Polyps follow up 3 years recommended Dr Hilarie Fredrickson Due 2025  Eye Exam:  Has not recently had eyes checked, deferring at this time.  Feels as though worse at night.  Has prescription glasses for night time driving but does not use.  Dental Exam:  Will follow as needed - defers yearly skin checks.  Patient Care Team: Unk Pinto, MD as PCP - General (Internal Medicine) Newton Pigg, [facility] as Pharmacist (Pharmacist)  Medical History:  has Morbid obesity [facility]); Type 2 diabetes mellitus with hyperlipidemia ([place]); Vitamin D deficiency; GERD (gastroesophageal reflux disease); OSA (obstructive sleep apnea); Hypertension; CKD stage 2 due to type 2 diabetes mellitus ([place]); Medication management; Venous insufficiency; Fecal occult blood test positive; Microcytic anemia; Aortic atherosclerosis ([place]); Hepatic steatosis; Hepatomegaly; BMI 45.0-49.9, adult ([place]); TIA (transient ischemic attack); and Thrombophilia ([place]) on their problem list. Surgical History:  He  has a past surgical history that includes Replacement total knee (Left); Total shoulder replacement (Right); Tonsillectomy; Colonoscopy (2010); and Total shoulder arthroplasty (Left, 02/07/2020). Family History:  His family history includes COPD in his mother and sister; Cancer in his mother; Cancer (age of onset: 17) in his father; Colon cancer in his father; Crohn's disease in his son; Diabetes in his father; Heart disease in his father; Hyperlipidemia in his father and mother; Hypertension in  his father and mother; Osteoporosis in his mother; Prostate cancer in his father.   Social History:   reports that he has never smoked. He has never used smokeless tobacco. He reports that he does not currently use alcohol. He reports that he does not use drugs. Review of Systems:  Review of Systems  Constitutional: Negative.  Negative for chills, fever, malaise/fatigue and weight loss.  HENT: Negative.  Negative for congestion, ear discharge, hearing loss, sore throat and tinnitus.   Eyes: Negative.  Negative for blurred vision, discharge and redness.  Respiratory:  Negative for cough, hemoptysis, sputum production, shortness of breath (with walking due to body habitus) and wheezing.   Cardiovascular:  Positive for leg swelling (improved). Negative for chest pain, palpitations, orthopnea and PND.  Gastrointestinal:  Negative for abdominal pain, blood in stool, constipation, diarrhea, heartburn (has hiatal hernia), melena, nausea and vomiting.  Genitourinary: Negative.  Negative  for dysuria, frequency and urgency.  Musculoskeletal:  Positive for back pain, joint pain (Right thumb) and myalgias. Negative for falls and neck pain.  Skin:  Positive for rash.  Neurological:  Positive for tingling ([place] bilaterally) and sensory change ([place] bilaterally). Negative for dizziness, seizures, weakness and headaches.  Endo/Heme/Allergies: Negative.  Does not bruise/bleed easily.  Psychiatric/Behavioral: Negative.  Negative for depression, hallucinations, memory loss and suicidal ideas. The patient does not have insomnia.     Physical Exam: Estimated body mass index is 42.58 kg/m as calculated from the following:   Height as of this encounter: 5' 10.5" (1.791 m).   Weight as of this encounter: 301 lb (136.5 kg). BP 126/68   Pulse 61   Temp 97.9 F (36.6 C)   Resp 17   Ht 5' 10.5" (1.791 m)   Wt (!) 301 lb (136.5 kg)   SpO2 96%   BMI 42.58 kg/m  General Appearance: Obese WM, in no apparent  distress.  Eyes: PERRLA, EOMs, conjunctiva no swelling or erythema, normal fundi and vessels.  Sinuses: No Frontal/maxillary tenderness  ENT/Mouth: Ext aud canals clear, normal light reflex with TMs without erythema, bulging. Mouth and nose not examined- patient wearing a facemask. Hearing normal.  Neck: Supple, thyroid normal. No bruits  Respiratory: Respiratory effort normal, BS decreased due to body habitus but equal bilaterally without rales, rhonchi, wheezing or stridor.  Cardio: RRR without murmurs, rubs or gallops. Brisk DP 2+ , 1+ edema with improved venous stasis bilateral legs, no warmth, no erythema, no hard cord.  Chest: symmetric, with normal excursions and percussion.  Abdomen: Soft, obese, nontender, no guarding, rebound, hernias, masses, or organomegaly.  Lymphatics: Non tender without lymphadenopathy.  Genitourinary: defer Musculoskeletal: Full ROM all peripheral extremities,antalgic gait, decreased movement right shoulder due to pain.  Skin: Warm, dry. Neuro: Cranial nerves intact, reflexes equal bilaterally. Normal muscle tone, no cerebellar symptoms. Sensation intact with monofilament bilateral feet without ulcers. Marland Kitchen  Psych: Awake and oriented X 3, normal affect, Insight and Judgment appropriate.  Foot Exam :Performed with monofilament- sensation intact bilaterally  EKG: Junctional rhythm, no ST changes.   Birtha Hatler 2:52 PM [facility]

## 2022-05-24 ENCOUNTER — Telehealth: Payer: Self-pay

## 2022-05-24 NOTE — Telephone Encounter (Signed)
LM-05/24/22-Chart Prep started, Reviewing OV, Consults, Hospital visits, Labs and medication changes.  Chart prep completed for CP follow up office visit on 9/26.  Total time spent: 44 min.

## 2022-06-07 NOTE — Telephone Encounter (Signed)
LM-06/07/22-Called pt. To confirm CP f/u office visit for 9/26 at 4:15pm. Pt. canceled d/t starting new job on 9/26. Offered to go ahead and r/s visit, but pt. Requested to call me when he gets his work schedule to r/s CP visit. Provided pt. Contact [phone] and requested pt. Call when ready to r/s. Pt. Verbalized understanding and agreed. Updated CP schedule and canceled visit.  Total time spent: 7 min.

## 2022-06-08 ENCOUNTER — Ambulatory Visit: Payer: PPO | Admitting: Pharmacy Technician

## 2022-06-23 ENCOUNTER — Telehealth: Payer: Self-pay

## 2022-06-23 NOTE — Telephone Encounter (Signed)
LM-06/23/22-Called pt. To see if he was okay with scheduling CP focused outreach for 10/25 (phone call) visit. Pt. Verbalized agreement and is aware CP will call him between 3-5PM. Informed pt. I will call him a few days prior to his visit to confirm. Pt. Verbalized understanding and agreed. Updated CP schedule and added pt. In time slot for 10/25. CP aware and approved of visit date/time.  Total time spent: 8 min.

## 2022-06-23 NOTE — Telephone Encounter (Signed)
LM-06/23/22-Pt. replied and stated he needs an appointment for either a Wednesday or Thursday. Sent CP message to see if pt. can have visit scheduled for Wed 10/25.    Total time spent: 5 min.

## 2022-07-05 ENCOUNTER — Ambulatory Visit: Payer: PPO | Admitting: Internal Medicine

## 2022-07-05 ENCOUNTER — Encounter: Payer: Self-pay | Admitting: Nurse Practitioner

## 2022-07-06 NOTE — Telephone Encounter (Signed)
LM-07/06/22-Called pt. To inform him CP will not be available d/t mandatory meeting on 11/25. Pt. Verbalized understanding and requested to r/s to 11/15 between 3-5PM. Informed pt. I will schedule visit, and will call if we need to make any changes d/t provider typically only being at [facility] on Tuesday's only. Pt. Verbalized understanding and asked me to send him a message through Mychart if we need to change date.  Total time spent: 10 min.

## 2022-07-07 ENCOUNTER — Telehealth: Payer: PPO | Admitting: Pharmacy Technician

## 2022-07-19 ENCOUNTER — Telehealth: Payer: Self-pay

## 2022-07-19 NOTE — Telephone Encounter (Signed)
LM-07/19/22-Chart review started. Reviewing OV, Consults, Hospital visits, Labs, and medication changes for general review call. Chart review complete. Called pt. And informed him of his CP visit being canceled and informed him I was calling to complete an assessment. Pt. Stated he was at work and would need to call me back on Wednesday when he is off work.   Total time spent: 17 min.

## 2022-07-27 NOTE — Telephone Encounter (Signed)
LM-07/27/22-Reviewed patients LFD's for review call. Called pt. To complete a general review call and inform pt. On CP visit cancellation. Unable to reach pt. [place] X1.  Total time spent: 6 min.

## 2022-08-04 NOTE — Telephone Encounter (Signed)
LM-08/04/22-Reviewed pts. LFD's for medication adherence prior to calling pt.Called pt. To complete General Review Call. Unable to reach pt. [place] X2.  Total time spent: 5 min.

## 2022-08-11 NOTE — Telephone Encounter (Signed)
LM-08/11/22-Called pt. And completed a General Review Call. Patient had no health concerns/questions. Pt. Aware I will call to r/s his f/u visit when we have new CP on staff.  Total time spent: 16 min.  General Review ([facility])  Chart Review Have there been any documented new, changed, or discontinued medications since last visit?  (Include name, dose, frequency, date): 03/24/22-Dana Carlye Grippe, NP-  STARTED Gabapentin 100 mg Oral 3 times daily Phentermine HCl 37.5 mg Oral Daily before breakfast Topiramate 50 mg Oral 2 times daily  Has there been any documented recent hospitalizations or ED visits since last visit with Clinical Lead?: No  Adherence Review Does the [facility] have access to medication refill data?: Yes Adherence rates for STAR metric medications: Atorvastatin '80mg'$  / 90DS / 03/24/22 & 05/31/22 Valsartan '160mg'$  / 65 & 90DS / 11/26/21 & 03/24/22 Ezetimibe '10mg'$  / 90DS / 03/07/22 & 06/03/22 Semaglutide '8mg'$  / 28DS / 05/06/22 & 06/02/22 Adherence rates for medications indicated for disease state being reviewed: Atorvastatin '80mg'$  / 90DS / 03/24/22 & 05/31/22 Valsartan '160mg'$  / 65 & 90DS / 11/26/21 & 03/24/22 Ezetimibe '10mg'$  / 90DS / 03/07/22 & 06/03/22 Semaglutide '8mg'$  / 28DS / 05/06/22 & 06/02/22 Does the patient have >5 day gap between last estimated fill dates for any of the above medications or other medication gaps?: Yes Reasons for medication gaps: Furosemide '40mg'$  / 90 & 75DS / 08/22/21 & 11/16/21-Stopped taking months ago pt. stopped on his own Gabapentin '100mg'$  / 30DS / 03/24/22 & 06/15/22-Takes PRN Omeprazole '40mg'$  / 90DS / 01/25/22 & 04/03/22-Had extra on hand, still taking Phentermine 37.'5mg'$  / 90 & 30DS / 07/02/21 & 03/24/22-Pt. has extra on hand, still taking Valsartan '160mg'$  / 65 & 90DS / 11/26/21 & 03/24/22-had extra on hand, still taking Semaglutide '8mg'$  / 28DS / 05/06/22 & 06/02/22-still taking per pt.  Disease State Questions Able to connect with the Patient?: Yes Did patient have any problems  with their health recently?: No Have you had any admissions or emergency room visits or worsening of your condition(s) since last visit?: No Have you had any visits with new specialists or providers since your last visit?: No Have you had any new health care problem(s) since your last visit?: No Have you run out of any of your medications since you last spoke with your clinical lead?: No Are there any medications you are not taking as prescribed?: Yes What has kept you from taking your medications as prescribed?: Furosemide- Pt. decided to stop d/t undesired side effects or frequent urination, and pt. feels he does not need to take it.  Are you having any issues or side effects with your medications?: No Do you have any other health concerns or questions you want to discuss with your Clinical lead  before your next visit?: No Are there any health concerns that you feel we can do a better job addressing?: No Any falls since last visit?: No Any increased or uncontrolled pain since last visit?: No Next visit Type:: Phone Visit with:: [facility] Date:: 08/11/2022 Time:: 8AM

## 2022-08-12 DIAGNOSIS — I10 Essential (primary) hypertension: Secondary | ICD-10-CM | POA: Diagnosis not present

## 2022-08-12 DIAGNOSIS — E1169 Type 2 diabetes mellitus with other specified complication: Secondary | ICD-10-CM | POA: Diagnosis not present

## 2022-08-12 DIAGNOSIS — E1122 Type 2 diabetes mellitus with diabetic chronic kidney disease: Secondary | ICD-10-CM | POA: Diagnosis not present

## 2022-08-12 DIAGNOSIS — N182 Chronic kidney disease, stage 2 (mild): Secondary | ICD-10-CM | POA: Diagnosis not present

## 2022-08-16 ENCOUNTER — Ambulatory Visit: Payer: PPO | Admitting: Internal Medicine

## 2022-08-25 ENCOUNTER — Encounter: Payer: Self-pay | Admitting: Internal Medicine

## 2022-08-26 ENCOUNTER — Other Ambulatory Visit: Payer: Self-pay | Admitting: Nurse Practitioner

## 2022-08-26 DIAGNOSIS — Z79899 Other long term (current) drug therapy: Secondary | ICD-10-CM

## 2022-08-26 DIAGNOSIS — G4730 Sleep apnea, unspecified: Secondary | ICD-10-CM

## 2022-09-15 NOTE — Progress Notes (Unsigned)
Assessment and Plan:  There are no diagnoses linked to this encounter.    Further disposition pending results of labs. Discussed med's effects and SE's.   Over 30 minutes of exam, counseling, chart review, and critical decision making was performed.   Future Appointments  Date Time Provider [place]  09/16/2022 10:30 AM Alycia Rossetti, NP [facility] None  03/28/2023  2:00 PM Alycia Rossetti, NP [facility] None  06/28/2023  2:00 PM Darrol Jump, NP [facility] None    ------------------------------------------------------------------------------------------------------------------   HPI There were no vitals taken for this visit. 67 y.o.male presents for  Past Medical History:  Diagnosis Date   Aortic atherosclerosis ([place])    GERD (gastroesophageal reflux disease)    Hepatic steatosis 11/15/2019   Hepatomegaly 11/15/2019   Hypertension    Microcytic anemia    Morbid obesity (HCC)    Sleep apnea    no cpap   TIA (transient ischemic attack) 2017   Type 2 diabetes mellitus with hyperlipidemia (HCC)    Vitamin D deficiency      Allergies  Allergen Reactions   Sulfamethoxazole-Trimethoprim Rash    Current Outpatient Medications on File Prior to Visit  Medication Sig   atorvastatin (LIPITOR) 80 MG tablet TAKE 1 TABLET BY MOUTH EVERY DAY FOR CHOLESTEROL   blood glucose meter kit and supplies KIT Dispense based on patient and insurance preference. Use up to four times daily as directed.   carvedilol (COREG) 12.5 MG tablet Take 1 tablet  2 x /day (every 12 hours)  for BP & Heart   Cholecalciferol 125 MCG (5000 UT) TABS Take 5,000 Units by mouth daily.    clopidogrel (PLAVIX) 75 MG tablet Take 1 tablet (75 mg total) by mouth daily.   Coenzyme Q10 200 MG capsule Take 200 mg by mouth daily before breakfast.   ezetimibe (ZETIA) 10 MG tablet TAKE 1 TABLET BY MOUTH EVERY DAY FOR CHOLESTEROL   Ferrous Sulfate (IRON) 325 (65 Fe) MG TABS Take by mouth.   furosemide  (LASIX) 40 MG tablet Take 1 tablet daily for BP & Fluid Retention / Ankle Swelling   gabapentin (NEURONTIN) 100 MG capsule Take 1 capsule (100 mg total) by mouth 3 (three) times daily.   glucose blood (ONETOUCH VERIO) test strip Check blood sugar 1 time a day-E11.22.   Lancets (ONETOUCH ULTRASOFT) lancets Use as instructed   Multiple Vitamin (MULTIVITAMIN) tablet Take 1 tablet by mouth daily.   omeprazole (PRILOSEC) 40 MG capsule TAKE 1 CAPSULE DAILY TO PREVENT HEART BURN & INDIGESTION   phentermine (ADIPEX-P) 37.5 MG tablet Take 1 tablet (37.5 mg total) by mouth daily before breakfast.   Semaglutide, 2 MG/DOSE, (OZEMPIC, 2 MG/DOSE,) 8 MG/3ML SOPN Inject 2 mg into the skin once a week.   topiramate (TOPAMAX) 50 MG tablet TAKE 1 TABLET BY MOUTH TWICE A DAY   valsartan (DIOVAN) 160 MG tablet Take 1 tablet daily for BP & Diabetic Kidney Protection   No current facility-administered medications on file prior to visit.    ROS: all negative except above.   Physical Exam:  There were no vitals taken for this visit.  General Appearance: Well nourished, in no apparent distress. Eyes: PERRLA, EOMs, conjunctiva no swelling or erythema Sinuses: No Frontal/maxillary tenderness ENT/Mouth: Ext aud canals clear, TMs without erythema, bulging. No erythema, swelling, or exudate on post pharynx.  Tonsils not swollen or erythematous. Hearing normal.  Neck: Supple, thyroid normal.  Respiratory: Respiratory effort normal, BS equal bilaterally without rales, rhonchi, wheezing or stridor.  Cardio: RRR with no MRGs. Brisk peripheral pulses without edema.  Abdomen: Soft, + BS.  Non tender, no guarding, rebound, hernias, masses. Lymphatics: Non tender without lymphadenopathy.  Musculoskeletal: Full ROM, 5/5 strength, normal gait.  Skin: Warm, dry without rashes, lesions, ecchymosis.  Neuro: Cranial nerves intact. Normal muscle tone, no cerebellar symptoms. Sensation intact.  Psych: Awake and oriented X 3,  normal affect, Insight and Judgment appropriate.     Alycia Rossetti, NP 9:17 AM [facility]

## 2022-09-16 ENCOUNTER — Ambulatory Visit (INDEPENDENT_AMBULATORY_CARE_PROVIDER_SITE_OTHER): Payer: PPO | Admitting: Nurse Practitioner

## 2022-09-16 ENCOUNTER — Other Ambulatory Visit: Payer: Self-pay

## 2022-09-16 ENCOUNTER — Encounter: Payer: Self-pay | Admitting: Nurse Practitioner

## 2022-09-16 VITALS — BP 136/60 | HR 55 | Temp 97.3°F | Ht 70.5 in | Wt 309.8 lb

## 2022-09-16 DIAGNOSIS — Z1152 Encounter for screening for COVID-19: Secondary | ICD-10-CM

## 2022-09-16 DIAGNOSIS — R6889 Other general symptoms and signs: Secondary | ICD-10-CM | POA: Diagnosis not present

## 2022-09-16 DIAGNOSIS — L309 Dermatitis, unspecified: Secondary | ICD-10-CM | POA: Diagnosis not present

## 2022-09-16 DIAGNOSIS — I10 Essential (primary) hypertension: Secondary | ICD-10-CM

## 2022-09-16 DIAGNOSIS — J0100 Acute maxillary sinusitis, unspecified: Secondary | ICD-10-CM | POA: Diagnosis not present

## 2022-09-16 LAB — POC COVID19 BINAXNOW: SARS Coronavirus 2 Ag: NEGATIVE

## 2022-09-16 LAB — POCT INFLUENZA A/B
Influenza A, POC: NEGATIVE
Influenza B, POC: NEGATIVE

## 2022-09-16 MED ORDER — BETAMETHASONE DIPROPIONATE 0.05 % EX CREA: TOPICAL_CREAM | Freq: Two times a day (BID) | CUTANEOUS | 0 refills | Status: DC

## 2022-09-16 MED ORDER — DEXAMETHASONE 1 MG PO TABS: ORAL_TABLET | ORAL | 0 refills | Status: DC

## 2022-09-16 MED ORDER — BENZONATATE 100 MG PO CAPS: 100.0000 mg | ORAL_CAPSULE | Freq: Four times a day (QID) | ORAL | 1 refills | Status: DC | PRN

## 2022-09-16 MED ORDER — ALBUTEROL SULFATE HFA 108 (90 BASE) MCG/ACT IN AERS: 2.0000 | INHALATION_SPRAY | Freq: Four times a day (QID) | RESPIRATORY_TRACT | 2 refills | Status: DC | PRN

## 2022-09-16 MED ORDER — AZITHROMYCIN 250 MG PO TABS: ORAL_TABLET | ORAL | 1 refills | Status: DC

## 2022-09-16 NOTE — Patient Instructions (Signed)

## 2022-09-22 ENCOUNTER — Encounter: Payer: Self-pay | Admitting: Nurse Practitioner

## 2022-09-22 ENCOUNTER — Other Ambulatory Visit: Payer: Self-pay | Admitting: Nurse Practitioner

## 2022-09-22 MED ORDER — DOXYCYCLINE HYCLATE 100 MG PO CAPS: ORAL_CAPSULE | ORAL | 0 refills | Status: DC

## 2022-10-11 ENCOUNTER — Other Ambulatory Visit: Payer: Self-pay | Admitting: Nurse Practitioner

## 2022-10-11 DIAGNOSIS — Z79899 Other long term (current) drug therapy: Secondary | ICD-10-CM

## 2022-10-11 DIAGNOSIS — E1142 Type 2 diabetes mellitus with diabetic polyneuropathy: Secondary | ICD-10-CM

## 2022-10-20 ENCOUNTER — Other Ambulatory Visit: Payer: Self-pay | Admitting: Nurse Practitioner

## 2022-10-20 ENCOUNTER — Telehealth: Payer: Self-pay | Admitting: Nurse Practitioner

## 2022-10-20 DIAGNOSIS — E1169 Type 2 diabetes mellitus with other specified complication: Secondary | ICD-10-CM

## 2022-10-20 NOTE — Telephone Encounter (Signed)
Script sent to [facility]

## 2022-10-20 NOTE — Telephone Encounter (Signed)
Patient is requesting a refill on Ozempic to [facility].

## 2022-10-26 ENCOUNTER — Other Ambulatory Visit: Payer: Self-pay | Admitting: Nurse Practitioner

## 2022-11-30 ENCOUNTER — Other Ambulatory Visit: Payer: Self-pay | Admitting: Nurse Practitioner

## 2022-11-30 DIAGNOSIS — Z79899 Other long term (current) drug therapy: Secondary | ICD-10-CM

## 2022-11-30 DIAGNOSIS — E785 Hyperlipidemia, unspecified: Secondary | ICD-10-CM

## 2022-12-04 ENCOUNTER — Encounter: Payer: Self-pay | Admitting: Nurse Practitioner

## 2022-12-05 ENCOUNTER — Other Ambulatory Visit: Payer: Self-pay | Admitting: Nurse Practitioner

## 2022-12-05 DIAGNOSIS — E1169 Type 2 diabetes mellitus with other specified complication: Secondary | ICD-10-CM

## 2022-12-05 MED ORDER — MOUNJARO 10 MG/0.5ML ~~LOC~~ SOAJ: 10.0000 mg | SUBCUTANEOUS | 3 refills | Status: DC

## 2022-12-06 ENCOUNTER — Telehealth: Payer: Self-pay

## 2022-12-06 NOTE — Telephone Encounter (Signed)
Prior auth completed and submitted. 

## 2022-12-07 ENCOUNTER — Other Ambulatory Visit: Payer: Self-pay | Admitting: Nurse Practitioner

## 2022-12-07 DIAGNOSIS — Z79899 Other long term (current) drug therapy: Secondary | ICD-10-CM

## 2022-12-07 DIAGNOSIS — E782 Mixed hyperlipidemia: Secondary | ICD-10-CM

## 2022-12-22 NOTE — Telephone Encounter (Signed)
[place] approved

## 2022-12-23 ENCOUNTER — Other Ambulatory Visit: Payer: Self-pay | Admitting: Nurse Practitioner

## 2022-12-23 DIAGNOSIS — Z79899 Other long term (current) drug therapy: Secondary | ICD-10-CM

## 2022-12-23 DIAGNOSIS — I10 Essential (primary) hypertension: Secondary | ICD-10-CM

## 2022-12-26 ENCOUNTER — Ambulatory Visit
Admission: EM | Admit: 2022-12-26 | Discharge: 2022-12-26 | Disposition: A | Discharge status: Home / Self Care | Payer: PPO | Attending: Urgent Care | Admitting: Urgent Care

## 2022-12-26 DIAGNOSIS — J019 Acute sinusitis, unspecified: Secondary | ICD-10-CM

## 2022-12-26 DIAGNOSIS — E119 Type 2 diabetes mellitus without complications: Secondary | ICD-10-CM

## 2022-12-26 MED ORDER — AMOXICILLIN-POT CLAVULANATE 875-125 MG PO TABS: 1.0000 | ORAL_TABLET | Freq: Two times a day (BID) | ORAL | 0 refills | Status: DC

## 2022-12-26 MED ORDER — PSEUDOEPHEDRINE HCL 30 MG PO TABS: 30.0000 mg | ORAL_TABLET | Freq: Three times a day (TID) | ORAL | 0 refills | Status: DC | PRN

## 2022-12-26 MED ORDER — CETIRIZINE HCL 10 MG PO TABS: 10.0000 mg | ORAL_TABLET | Freq: Every day | ORAL | 0 refills | Status: AC

## 2022-12-26 MED ORDER — PROMETHAZINE-DM 6.25-15 MG/5ML PO SYRP: 5.0000 mL | ORAL_SOLUTION | Freq: Three times a day (TID) | ORAL | 0 refills | Status: DC | PRN

## 2022-12-26 NOTE — Discharge Instructions (Addendum)
We will manage this as a sinus infection with amoxicillin-clavulanate. For sore throat or cough try using a honey-based tea. Use 3 teaspoons of honey with juice squeezed from half lemon. Place shaved pieces of ginger into 1/2-1 cup of water and warm over stove top. Then mix the ingredients and repeat every 4 hours as needed. Please take ibuprofen 600mg every 6 hours with food alternating with OR taken together with Tylenol 500mg-650mg every 6 hours for throat pain, fevers, aches and pains. Hydrate very well with at least 2 liters of water. Eat light meals such as soups (chicken and noodles, vegetable, chicken and wild rice).  Do not eat foods that you are allergic to.  Taking an antihistamine like Zyrtec can help against postnasal drainage, sinus congestion which can cause sinus pain, sinus headaches, throat pain, painful swallowing, coughing.  You can take this together with pseudoephedrine (Sudafed) at a dose of 60 mg 3 times a day or twice daily as needed for the same kind of nasal drip, congestion.  Use cough medication as needed.   

## 2022-12-26 NOTE — ED Triage Notes (Signed)
Pt reports cough, runny nose and ear fullness x 3 weeks. Claritin gives no relief.

## 2022-12-26 NOTE — ED Provider Notes (Signed)
[facility]  Note:  This document was prepared using Conservation officer, historic buildings and may include unintentional dictation errors.  MRN: [id number] DOB: [birth year]  Subjective:   Dustin Ayala is a 67 y.o. male presenting for 3-week history of persistent runny and stuffy nose, sinus drainage, now having right ear fullness and pain.  Has also been coughing.  No chest pain, shortness of breath or wheezing.  No history of asthma. No smoking of any kind including cigarettes, cigars, vaping, marijuana use.  Has used Claritin with minimal relief.  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  No current facility-administered medications for this encounter.  Current Outpatient Medications:    albuterol (VENTOLIN HFA) 108 (90 Base) MCG/ACT inhaler, Inhale 2 puffs into the lungs every 6 (six) hours as needed for wheezing or shortness of breath., Disp: 8 g, Rfl: 2   atorvastatin (LIPITOR) 80 MG tablet, TAKE 1 TABLET BY MOUTH EVERY DAY FOR CHOLESTEROL, Disp: 90 tablet, Rfl: 0   azithromycin (ZITHROMAX) 250 MG tablet, Take 2 tablets (500 mg) on  Day 1,  followed by 1 tablet (250 mg) once daily on Days 2 through 5., Disp: 6 each, Rfl: 1   benzonatate (TESSALON PERLES) 100 MG capsule, Take 1 capsule (100 mg total) by mouth every 6 (six) hours as needed for cough., Disp: 30 capsule, Rfl: 1   betamethasone dipropionate 0.05 % cream, Apply topically 2 (two) times daily., Disp: 30 g, Rfl: 0   blood glucose meter kit and supplies KIT, Dispense based on patient and insurance preference. Use up to four times daily as directed., Disp: 1 each, Rfl: 0   carvedilol (COREG) 12.5 MG tablet, Take 1 tablet  2 x /day (every 12 hours)  for BP & Heart, Disp: 180 tablet, Rfl: 1   Cholecalciferol 125 MCG (5000 UT) TABS, Take 5,000 Units by mouth daily. , Disp: , Rfl:    clopidogrel (PLAVIX) 75 MG tablet, TAKE 1 TABLET BY MOUTH EVERY DAY, Disp: 90 tablet, Rfl: 1   Coenzyme  Q10 200 MG capsule, Take 200 mg by mouth daily before breakfast., Disp: , Rfl:    dexamethasone (DECADRON) 1 MG tablet, Take 3 tabs for 3 days, 2 tabs for 3 days 1 tab for 5 days. Take with food., Disp: 20 tablet, Rfl: 0   Dextromethorphan-guaiFENesin (MUCINEX DM PO), Take by mouth., Disp: , Rfl:    doxycycline (VIBRAMYCIN) 100 MG capsule, Take 1 capsule 2 x /day with meals for Infection, Disp: 10 capsule, Rfl: 0   ezetimibe (ZETIA) 10 MG tablet, TAKE 1 TABLET BY MOUTH EVERY DAY FOR CHOLESTEROL, Disp: 90 tablet, Rfl: 1   Ferrous Sulfate (IRON) 325 (65 Fe) MG TABS, Take by mouth., Disp: , Rfl:    furosemide (LASIX) 40 MG tablet, Take 1 tablet daily for BP & Fluid Retention / Ankle Swelling, Disp: 180 tablet, Rfl: 0   gabapentin (NEURONTIN) 100 MG capsule, TAKE 1 CAPSULE (100 MG TOTAL) BY MOUTH THREE TIMES DAILY., Disp: 90 capsule, Rfl: 3   glucose blood (ONETOUCH VERIO) test strip, Check blood sugar 1 time a day-E11.22., Disp: 100 each, Rfl: 12   Lancets (ONETOUCH ULTRASOFT) lancets, Use as instructed, Disp: 100 each, Rfl: 12   Multiple Vitamin (MULTIVITAMIN) tablet, Take 1 tablet by mouth daily., Disp: , Rfl:    omeprazole (PRILOSEC) 40 MG capsule, TAKE 1 CAPSULE DAILY TO PREVENT HEART BURN & INDIGESTION, Disp: 90 capsule, Rfl: 2   phentermine (ADIPEX-P) 37.5  MG tablet, Take 1 tablet (37.5 mg total) by mouth daily before breakfast., Disp: 30 tablet, Rfl: 2   tirzepatide (MOUNJARO) 10 MG/0.5ML Pen, Inject 10 mg into the skin once a week., Disp: 2 mL, Rfl: 3   topiramate (TOPAMAX) 50 MG tablet, TAKE 1 TABLET BY MOUTH TWICE A DAY, Disp: 180 tablet, Rfl: 2   valsartan (DIOVAN) 160 MG tablet, TAKE 1 TABLET DAILY FOR BP & DIABETIC KIDNEY PROTECTION, Disp: 90 tablet, Rfl: 1   Allergies  Allergen Reactions   Sulfamethoxazole-Trimethoprim Rash    Past Medical History:  Diagnosis Date   Aortic atherosclerosis    GERD (gastroesophageal reflux disease)    Hepatic steatosis 11/15/2019   Hepatomegaly  11/15/2019   Hypertension    Microcytic anemia    Morbid obesity    Sleep apnea    no cpap   TIA (transient ischemic attack) 2017   Type 2 diabetes mellitus with hyperlipidemia    Vitamin D deficiency      Past Surgical History:  Procedure Laterality Date   COLONOSCOPY  2010   no family history   REPLACEMENT TOTAL KNEE Left    BACK IN 2010   TONSILLECTOMY     TOTAL SHOULDER ARTHROPLASTY Left 02/07/2020   Procedure: REVERSE TOTAL SHOULDER ARTHROPLASTY;  Surgeon: Jones Broom, MD;  Location: [facility];  Service: Orthopedics;  Laterality: Left;   TOTAL SHOULDER REPLACEMENT Right    8.21.2018    Family History  Problem Relation Age of Onset   COPD Mother    Cancer Mother        basal   Hypertension Mother    Hyperlipidemia Mother    Osteoporosis Mother    Prostate cancer Father    Cancer Father 16       stomach CA   Diabetes Father    Hypertension Father    Hyperlipidemia Father    Colon cancer Father    Heart disease Father    COPD Sister    Crohn's disease Son    Esophageal cancer Neg Hx    Pancreatic cancer Neg Hx    Stomach cancer Neg Hx     Social History   Tobacco Use   Smoking status: Never   Smokeless tobacco: Never  Vaping Use   Vaping Use: Never used  Substance Use Topics   Alcohol use: Not Currently   Drug use: Never    ROS   Objective:   Vitals: BP 126/72 (BP Location: Right Arm)   Pulse 64   Temp 98.1 F (36.7 C) (Oral)   Resp 19   SpO2 94%   Physical Exam Constitutional:      General: He is not in acute distress.    Appearance: Normal appearance. He is well-developed and normal weight. He is not ill-appearing, toxic-appearing or diaphoretic.  HENT:     Head: Normocephalic and atraumatic.     Right Ear: Ear canal and external ear normal. No drainage, swelling or tenderness. A middle ear effusion is present. There is no impacted cerumen. Tympanic membrane is injected. Tympanic membrane is not erythematous or bulging.     Left Ear:  Ear canal and external ear normal. No drainage, swelling or tenderness. A middle ear effusion is present. There is no impacted cerumen. Tympanic membrane is not injected, erythematous or bulging.     Nose: Congestion present. No rhinorrhea.     Mouth/Throat:     Mouth: Mucous membranes are moist.     Pharynx: No oropharyngeal exudate or posterior oropharyngeal  erythema.  Eyes:     General: No scleral icterus.       Right eye: No discharge.        Left eye: No discharge.     Extraocular Movements: Extraocular movements intact.     Conjunctiva/sclera: Conjunctivae normal.  Cardiovascular:     Rate and Rhythm: Normal rate and regular rhythm.     Heart sounds: Normal heart sounds. No murmur heard.    No friction rub. No gallop.  Pulmonary:     Effort: Pulmonary effort is normal. No respiratory distress.     Breath sounds: Normal breath sounds. No stridor. No wheezing, rhonchi or rales.  Musculoskeletal:     Cervical back: Normal range of motion and neck supple. No rigidity. No muscular tenderness.  Neurological:     General: No focal deficit present.     Mental Status: He is alert and oriented to person, place, and time.  Psychiatric:        Mood and Affect: Mood normal.        Behavior: Behavior normal.        Thought Content: Thought content normal.     Assessment and Plan :   PDMP not reviewed this encounter.  1. Acute sinusitis, recurrence not specified, unspecified location   2. Type 2 diabetes mellitus treated without insulin     Deferred imaging given clear cardiopulmonary exam, hemodynamically stable vital signs.  Creatinine clearance calculated to be 112 mL/min based off of his results from July of last year. Will start empiric treatment for sinusitis with Augmentin.  Recommended supportive care otherwise.  Has no history of arrhythmias and therefore is reasonable to use pseudoephedrine.  Counseled patient on potential for adverse effects with medications  prescribed/recommended today, ER and return-to-clinic precautions discussed, patient verbalized understanding.    Wallis Bamberg, [place] 12/26/22 1044

## 2023-01-25 ENCOUNTER — Encounter: Payer: Self-pay | Admitting: Nurse Practitioner

## 2023-01-26 ENCOUNTER — Other Ambulatory Visit: Payer: Self-pay | Admitting: Nurse Practitioner

## 2023-01-26 DIAGNOSIS — Z79899 Other long term (current) drug therapy: Secondary | ICD-10-CM

## 2023-01-26 DIAGNOSIS — I10 Essential (primary) hypertension: Secondary | ICD-10-CM

## 2023-01-26 MED ORDER — CARVEDILOL 12.5 MG PO TABS: ORAL_TABLET | ORAL | 1 refills | Status: DC

## 2023-02-09 ENCOUNTER — Telehealth: Payer: Self-pay | Admitting: *Deleted

## 2023-02-09 ENCOUNTER — Ambulatory Visit (INDEPENDENT_AMBULATORY_CARE_PROVIDER_SITE_OTHER): Payer: PPO | Admitting: Nurse Practitioner

## 2023-02-09 ENCOUNTER — Encounter: Payer: Self-pay | Admitting: Nurse Practitioner

## 2023-02-09 VITALS — BP 104/60 | HR 53 | Temp 97.3°F | Ht 70.5 in | Wt 317.2 lb

## 2023-02-09 DIAGNOSIS — Z79899 Other long term (current) drug therapy: Secondary | ICD-10-CM | POA: Diagnosis not present

## 2023-02-09 DIAGNOSIS — M542 Cervicalgia: Secondary | ICD-10-CM

## 2023-02-09 DIAGNOSIS — R635 Abnormal weight gain: Secondary | ICD-10-CM

## 2023-02-09 DIAGNOSIS — Z1329 Encounter for screening for other suspected endocrine disorder: Secondary | ICD-10-CM

## 2023-02-09 NOTE — Telephone Encounter (Signed)
General Review Call  Dustin Ayala,Dustin Ayala  67 years, Male  DOB: [birth year]  M:   __________________________________________________ General Review [facility]) Chart Review Have there been any documented new, changed, or discontinued medications since last visit?  (Include name, dose, frequency, date):  01/04/2024Aundria Rud (PCP office)- started albuterol sulfate 2 puffs q6hr prn, started azithromycin 250mg  2 tablets day 1 then 1 tablet daily for 4 days, started benzonatate 100mg  q6hr prn, started betamethasone dipropionate 0.05% BID, started dexamethasone 1mg  3 tabs for 3 days then 2 tabs for 3 days then 1 tab for 5 days. 01/10/2024Lawernce Ion (PCP office)- started doxycycline 100mg  BID for 5 days. 03/24/2024Aundria Rud (PCP office)- stopped Ozempic 2mg , started [facility] 10mg  inject once a week. 04/14/2024Urban Gibson (Urgent care)- started amoxicillin-pot clavulanate 875-125mg  BID, started cetirizine HCl 10mg  daily, started promethazine-dm 6.25-15mg  take TID prn, started pseudoephedrine HCl 30mg  q8hr prn.  Has there been any documented recent hospitalizations or ED visits since last visit with Clinical Lead?: No  Adherence Review Does the [facility] have access to medication refill data?: Yes Adherence rates for STAR metric medications: Valsartan 160mg - 12/24/2022 90DS, 03/24/2022 90DS Ezetimibe 10mg - 12/07/2022 90DS, 09/11/2022 90DS Atorvastatin 80mg - 11/30/2022 90DS, 09/04/2022 90Ds Adherence rates for medications indicated for disease state being reviewed: Valsartan 160mg - 12/24/2022 90DS, 03/24/2022 90DS Ezetimibe 10mg - 12/07/2022 90DS, 09/11/2022 90DS Atorvastatin 80mg - 11/30/2022 90DS, 09/04/2022 90Ds Does the patient have >5 day gap between last estimated fill dates for any of the above medications or other medication gaps?: No  Disease State Questions Able to connect with the Patient?: Yes Did patient have any problems with their health recently?: No Have you had any admissions or emergency room  visits or worsening of your condition(s) since last visit?: No Have you had any visits with new specialists or providers since your last visit?: No Have you had any new health care problem(s) since your last visit?: No Have you run out of any of your medications since you last spoke with your clinical lead?: No Are there any medications you are not taking as prescribed?: No Are you having any issues or side effects with your medications?: No Do you have any other health concerns or questions you want to discuss with your Clinical lead  before your next visit?: No Are there any health concerns that you feel we can do a better job addressing?: No Any falls since last visit?: No Any increased or uncontrolled pain since last visit?: No Next visit Type:: Phone Visit with:: [facility] Date:: 01/26/2023 Time:: 2pm Additional Details: Patient states that he is doing well and he does not have any concerns or issues currently with his health or his medications. Patient did run into an issue with Ozempic back in March and switched to [facility], this was only due to supply shortage. Patient is not having any issues with Mounjaro since starting the medication and so far so good with being able to get it filled. Instructed patient to call us if he does run into any concerns or issues regarding medications or health in general, pt verbalized understanding. Engagement Notes HC reviewing pts chart prior to general review call. Reviewing office visits, consults, hospital visits, lab and medication adherence/changes. Patient is doing well and has no concerns or issues with health or medications at this time. ( )  Clinical Lead Review Review Adherence gaps identified?: No Drug Therapy Problems identified?: No Assessment: No new needs identified. Follow-up as scheduled CRN call review: (4 min)

## 2023-02-09 NOTE — Progress Notes (Signed)
Assessment and Plan:  Dustin Ayala was seen today for an episodic visit.  Diagnoses and all order for this visit:  1. Weight gain Monitor TSH levels - past have been normal Discussed possible switch from [facility] to back to Ozempic  - TSH  2. Screening for thyroid disorder  - TSH  3. Medication management All medications discussed and reviewed in full. All questions and concerns regarding medications addressed.    - TSH  4. Neck pain Extend Mounjaro injection from 7 days to 12 days to see if neck pain improves.   If responsive and neck pain decreases discontinue Mounjaro and restart Ozempic Otherwise will treat as neck strain and complete further work up and diagnostics.   Notify office for further evaluation and treatment, questions or concerns if s/s fail to improve. The risks and benefits of my recommendations, as well as other treatment options were discussed with the patient today. Questions were answered.  Further disposition pending results of labs. Discussed med's effects and SE's.    Over 20 minutes of exam, counseling, chart review, and critical decision making was performed.   Future Appointments  Date Time Provider Department Center  03/28/2023  2:00 PM Raynelle Dick, NP [facility] None  06/28/2023  2:00 PM Amarria Andreasen, Archie Patten, NP [facility] None    ------------------------------------------------------------------------------------------------------------------  HPI BP 104/60   Pulse (!) 53   Temp (!) 97.3 F (36.3 C)   Ht 5' 10.5" (1.791 m)   Wt (!) 317 lb 3.2 oz (143.9 kg)   SpO2 95%   BMI 44.87 kg/m   67 y.o.male presents for evaluation of neck pain.  He feels as though this could be related to switching from Ozempic to [place].  States the neck pain was noticed during that time and has lingered.  Neck pain is located along the back lateral sides of his neck.  States that he has changed pillows which has not helped.  Reports stiffness when turing  from side to side.  Pain feels as though it is a muscle strain.  He has taken OTC medications with  minimal relief.  He is concerned as he feels as though this has occurred over the last four months.  He denies any recent trauma, fall, injury.    He was recently seen and treated for URI since 09/2022.  Past Medical History:  Diagnosis Date   Aortic atherosclerosis (HCC)    GERD (gastroesophageal reflux disease)    Hepatic steatosis 11/15/2019   Hepatomegaly 11/15/2019   Hypertension    Microcytic anemia    Morbid obesity (HCC)    Sleep apnea    no cpap   TIA (transient ischemic attack) 2017   Type 2 diabetes mellitus with hyperlipidemia (HCC)    Vitamin D deficiency      Allergies  Allergen Reactions   Sulfamethoxazole-Trimethoprim Rash    Current Outpatient Medications on File Prior to Visit  Medication Sig   atorvastatin (LIPITOR) 80 MG tablet TAKE 1 TABLET BY MOUTH EVERY DAY FOR CHOLESTEROL   betamethasone dipropionate 0.05 % cream Apply topically 2 (two) times daily.   blood glucose meter kit and supplies KIT Dispense based on patient and insurance preference. Use up to four times daily as directed.   carvedilol (COREG) 12.5 MG tablet Take 1 tablet  2 x /day (every 12 hours)  for BP & Heart   cetirizine (ZYRTEC ALLERGY) 10 MG tablet Take 1 tablet (10 mg total) by mouth daily.   Cholecalciferol 125 MCG (5000 UT) TABS  Take 5,000 Units by mouth daily.    Coenzyme Q10 200 MG capsule Take 200 mg by mouth daily before breakfast.   ezetimibe (ZETIA) 10 MG tablet TAKE 1 TABLET BY MOUTH EVERY DAY FOR CHOLESTEROL   Ferrous Sulfate (IRON) 325 (65 Fe) MG TABS Take by mouth.   furosemide (LASIX) 40 MG tablet Take 1 tablet daily for BP & Fluid Retention / Ankle Swelling   gabapentin (NEURONTIN) 100 MG capsule TAKE 1 CAPSULE (100 MG TOTAL) BY MOUTH THREE TIMES DAILY.   glucose blood (ONETOUCH VERIO) test strip Check blood sugar 1 time a day-E11.22.   Lancets (ONETOUCH ULTRASOFT) lancets  Use as instructed   Multiple Vitamin (MULTIVITAMIN) tablet Take 1 tablet by mouth daily.   omeprazole (PRILOSEC) 40 MG capsule TAKE 1 CAPSULE DAILY TO PREVENT HEART BURN & INDIGESTION   phentermine (ADIPEX-P) 37.5 MG tablet Take 1 tablet (37.5 mg total) by mouth daily before breakfast.   tirzepatide (MOUNJARO) 10 MG/0.5ML Pen Inject 10 mg into the skin once a week.   topiramate (TOPAMAX) 50 MG tablet TAKE 1 TABLET BY MOUTH TWICE A DAY   valsartan (DIOVAN) 160 MG tablet TAKE 1 TABLET DAILY FOR BP & DIABETIC KIDNEY PROTECTION   albuterol (VENTOLIN HFA) 108 (90 Base) MCG/ACT inhaler Inhale 2 puffs into the lungs every 6 (six) hours as needed for wheezing or shortness of breath. (Patient not taking: Reported on 02/09/2023)   amoxicillin-clavulanate (AUGMENTIN) 875-125 MG tablet Take 1 tablet by mouth 2 (two) times daily. (Patient not taking: Reported on 02/09/2023)   benzonatate (TESSALON PERLES) 100 MG capsule Take 1 capsule (100 mg total) by mouth every 6 (six) hours as needed for cough. (Patient not taking: Reported on 02/09/2023)   clopidogrel (PLAVIX) 75 MG tablet TAKE 1 TABLET BY MOUTH EVERY DAY (Patient not taking: Reported on 02/09/2023)   dexamethasone (DECADRON) 1 MG tablet Take 3 tabs for 3 days, 2 tabs for 3 days 1 tab for 5 days. Take with food. (Patient not taking: Reported on 02/09/2023)   Dextromethorphan-guaiFENesin [facility] DM PO) Take by mouth. (Patient not taking: Reported on 02/09/2023)   promethazine-dextromethorphan (PROMETHAZINE-DM) 6.25-15 MG/5ML syrup Take 5 mLs by mouth 3 (three) times daily as needed for cough. (Patient not taking: Reported on 02/09/2023)   pseudoephedrine (SUDAFED) 30 MG tablet Take 1 tablet (30 mg total) by mouth every 8 (eight) hours as needed for congestion. (Patient not taking: Reported on 02/09/2023)   No current facility-administered medications on file prior to visit.    ROS: all negative except what is noted in the HPI.   Physical Exam:  BP 104/60    Pulse (!) 53   Temp (!) 97.3 F (36.3 C)   Ht 5' 10.5" (1.791 m)   Wt (!) 317 lb 3.2 oz (143.9 kg)   SpO2 95%   BMI 44.87 kg/m   General Appearance: NAD.  Awake, conversant and cooperative. Eyes: PERRLA, EOMs intact.  Sclera white.  Conjunctiva without erythema. Sinuses: No frontal/maxillary tenderness.  No nasal discharge. Nares patent.  ENT/Mouth: Ext aud canals clear.  Bilateral TMs w/DOL and without erythema or bulging. Hearing intact.  Posterior pharynx without swelling or exudate.  Tonsils without swelling or erythema.  Neck: Supple.  No masses, nodules or thyromegaly. Respiratory: Effort is regular with non-labored breathing. Breath sounds are equal bilaterally without rales, rhonchi, wheezing or stridor.  Cardio: RRR with no MRGs. Brisk peripheral pulses without edema.  Abdomen: Active BS in all four quadrants.  Soft and non-tender without guarding,  rebound tenderness, hernias or masses. Lymphatics: Non tender without lymphadenopathy.  Musculoskeletal: LROM in left, turning from side to side d/t pain. Full ROM, 5/5 strength, normal ambulation.  No clubbing or cyanosis. Skin: Appropriate color for ethnicity. Warm without rashes, lesions, ecchymosis, ulcers.  Neuro: CN II-XII grossly normal. Normal muscle tone without cerebellar symptoms and intact sensation.   Psych: AO X 3,  appropriate mood and affect, insight and judgment.     Adela Glimpse, NP 4:29 PM [facility]

## 2023-02-10 LAB — TSH: TSH: 1.06 mIU/L (ref 0.40–4.50)

## 2023-02-22 ENCOUNTER — Encounter: Payer: Self-pay | Admitting: Internal Medicine

## 2023-02-28 ENCOUNTER — Encounter: Payer: Self-pay | Admitting: Nurse Practitioner

## 2023-02-28 DIAGNOSIS — M4802 Spinal stenosis, cervical region: Secondary | ICD-10-CM

## 2023-02-28 DIAGNOSIS — M542 Cervicalgia: Secondary | ICD-10-CM

## 2023-03-01 ENCOUNTER — Other Ambulatory Visit: Payer: Self-pay | Admitting: Nurse Practitioner

## 2023-03-01 ENCOUNTER — Encounter: Payer: Self-pay | Admitting: Nurse Practitioner

## 2023-03-01 DIAGNOSIS — M542 Cervicalgia: Secondary | ICD-10-CM

## 2023-03-01 DIAGNOSIS — E1169 Type 2 diabetes mellitus with other specified complication: Secondary | ICD-10-CM

## 2023-03-01 MED ORDER — SEMAGLUTIDE (1 MG/DOSE) 4 MG/3ML ~~LOC~~ SOPN
1.0000 mg | PEN_INJECTOR | SUBCUTANEOUS | 2 refills | Status: DC
Start: 2023-03-01 — End: 2023-03-28

## 2023-03-01 MED ORDER — CYCLOBENZAPRINE HCL 5 MG PO TABS
5.0000 mg | ORAL_TABLET | Freq: Three times a day (TID) | ORAL | 0 refills | Status: DC | PRN
Start: 2023-03-01 — End: 2023-08-16

## 2023-03-07 ENCOUNTER — Other Ambulatory Visit: Payer: Self-pay | Admitting: Nurse Practitioner

## 2023-03-07 DIAGNOSIS — E1169 Type 2 diabetes mellitus with other specified complication: Secondary | ICD-10-CM

## 2023-03-07 DIAGNOSIS — Z79899 Other long term (current) drug therapy: Secondary | ICD-10-CM

## 2023-03-09 ENCOUNTER — Ambulatory Visit
Admission: RE | Admit: 2023-03-09 | Discharge: 2023-03-09 | Disposition: A | Discharge status: Home / Self Care | Payer: PPO | Source: Ambulatory Visit | Attending: Nurse Practitioner | Admitting: Nurse Practitioner

## 2023-03-09 DIAGNOSIS — M4802 Spinal stenosis, cervical region: Secondary | ICD-10-CM | POA: Diagnosis not present

## 2023-03-09 DIAGNOSIS — M542 Cervicalgia: Secondary | ICD-10-CM

## 2023-03-28 ENCOUNTER — Encounter: Payer: Self-pay | Admitting: Nurse Practitioner

## 2023-03-28 ENCOUNTER — Ambulatory Visit (INDEPENDENT_AMBULATORY_CARE_PROVIDER_SITE_OTHER): Payer: PPO | Admitting: Nurse Practitioner

## 2023-03-28 VITALS — BP 112/62 | HR 62 | Temp 97.5°F | Ht 70.5 in | Wt 317.8 lb

## 2023-03-28 DIAGNOSIS — R160 Hepatomegaly, not elsewhere classified: Secondary | ICD-10-CM

## 2023-03-28 DIAGNOSIS — E785 Hyperlipidemia, unspecified: Secondary | ICD-10-CM | POA: Diagnosis not present

## 2023-03-28 DIAGNOSIS — K2100 Gastro-esophageal reflux disease with esophagitis, without bleeding: Secondary | ICD-10-CM

## 2023-03-28 DIAGNOSIS — Z1389 Encounter for screening for other disorder: Secondary | ICD-10-CM | POA: Diagnosis not present

## 2023-03-28 DIAGNOSIS — D6859 Other primary thrombophilia: Secondary | ICD-10-CM

## 2023-03-28 DIAGNOSIS — Z0001 Encounter for general adult medical examination with abnormal findings: Secondary | ICD-10-CM

## 2023-03-28 DIAGNOSIS — E1169 Type 2 diabetes mellitus with other specified complication: Secondary | ICD-10-CM | POA: Diagnosis not present

## 2023-03-28 DIAGNOSIS — K760 Fatty (change of) liver, not elsewhere classified: Secondary | ICD-10-CM

## 2023-03-28 DIAGNOSIS — N401 Enlarged prostate with lower urinary tract symptoms: Secondary | ICD-10-CM

## 2023-03-28 DIAGNOSIS — E1142 Type 2 diabetes mellitus with diabetic polyneuropathy: Secondary | ICD-10-CM | POA: Diagnosis not present

## 2023-03-28 DIAGNOSIS — Z0000 Encounter for general adult medical examination without abnormal findings: Secondary | ICD-10-CM | POA: Diagnosis not present

## 2023-03-28 DIAGNOSIS — G4733 Obstructive sleep apnea (adult) (pediatric): Secondary | ICD-10-CM

## 2023-03-28 DIAGNOSIS — I10 Essential (primary) hypertension: Secondary | ICD-10-CM | POA: Diagnosis not present

## 2023-03-28 DIAGNOSIS — Z136 Encounter for screening for cardiovascular disorders: Secondary | ICD-10-CM | POA: Diagnosis not present

## 2023-03-28 DIAGNOSIS — E559 Vitamin D deficiency, unspecified: Secondary | ICD-10-CM | POA: Diagnosis not present

## 2023-03-28 DIAGNOSIS — Z79899 Other long term (current) drug therapy: Secondary | ICD-10-CM

## 2023-03-28 DIAGNOSIS — E1122 Type 2 diabetes mellitus with diabetic chronic kidney disease: Secondary | ICD-10-CM

## 2023-03-28 DIAGNOSIS — I700 Atherosclerosis of aorta: Secondary | ICD-10-CM | POA: Diagnosis not present

## 2023-03-28 DIAGNOSIS — N182 Chronic kidney disease, stage 2 (mild): Secondary | ICD-10-CM | POA: Diagnosis not present

## 2023-03-28 DIAGNOSIS — Z8673 Personal history of transient ischemic attack (TIA), and cerebral infarction without residual deficits: Secondary | ICD-10-CM

## 2023-03-28 LAB — CBC WITH DIFFERENTIAL/PLATELET
Absolute Monocytes: 696 cells/uL (ref 200–950)
Basophils Absolute: 94 cells/uL (ref 0–200)
Basophils Relative: 1 %
Eosinophils Absolute: 338 cells/uL (ref 15–500)
MCH: 26.3 pg — ABNORMAL LOW (ref 27.0–33.0)
Monocytes Relative: 7.4 %
Neutrophils Relative %: 66.2 %
RBC: 5.39 10*6/uL (ref 4.20–5.80)
RDW: 14.1 % (ref 11.0–15.0)

## 2023-03-28 MED ORDER — CARVEDILOL 12.5 MG PO TABS
12.5000 mg | ORAL_TABLET | Freq: Every day | ORAL | Status: DC
Start: 2023-03-28 — End: 2023-04-28

## 2023-03-28 NOTE — Patient Instructions (Signed)
Fair life protein shakes Eat more frequently - try not to go more than 6 hours without protein Aim for 90 grams of protein a day- 30 breakfast/30 lunch 30 dinner Try to keep net carbs less than 50 Net Carbs=Total Carbs-fiber- sugar alcohols Exercise heartrate 120-140(fat burning zone)- walking 20-30 minutes 4 days a week  

## 2023-03-28 NOTE — Progress Notes (Signed)
COMPLETE PHYSICAL Assessment and Plan:  Encounter for general adult medical examination with abnormal findings 1 year  Essential hypertension Continue Carvedilol to 12.5 mg once a day, valsartan 160 mg QD - continue medications, DASH diet, exercise and monitor at home. Call if greater than 130/80.   Go to the ER if any chest pain, shortness of breath, nausea, dizziness, severe HA, changes vision/speech   Acquired Thrombophilia (HCC) MONITOR- patient on plavix - Check CBC  Aortic atherosclerosis (HCC) Control blood pressure, cholesterol, glucose, increase exercise.   Type 2 diabetes mellitus with Morbid obesity (HCC)       Continue TOPAMAX and  PHENTERMINE Continue Ozempic 2 mg SQ QW Continue to decrease portion size, Limit Saturated Fats and simple carbs, increase activity.         - follow up 3 months for progress monitoring - increase veggies, decrease carbs - long discussion about weight loss, diet, and exercise   CKD stage 2 due to type 2 diabetes mellitus (HCC) Increase fluids, avoid NSAIDS, monitor sugars, will monitor  -     COMPLETE METABOLIC PANEL WITH GFR -     Hemoglobin A1c -     EKG 12-Lead  Type 2 diabetes mellitus with hyperlipidemia (HCC) Continue Atorvastatin 80 mg QD Discussed general issues about diabetes pathophysiology and management., Educational material distributed., Suggested low cholesterol diet., Encouraged aerobic exercise., Discussed foot care., Reminded to get yearly retinal exam. CONTINUE OZEMPIC, CONTINUE WEIGHT LOSS -     Lipid panel -     Hemoglobin A1c -     EKG 12-Lead  Essential hypertension - continue medications, DASH diet, exercise and monitor at home. Call if greater than 130/80.  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   Gastroesophageal reflux disease with esophagitis without hemorrhage Continue PPI/H2 blocker, diet discussed -Check Magnesium  OSA (obstructive sleep apnea) Long discussion about how it is harder to manage his chronic co morbidities if he is not treating OSA Stressed importance of weight loss  HX of TIA (transient ischemic attack) Continue plavix Control blood pressure, cholesterol, glucose, increase exercise.   BPH with LUTS -     PSA  Screening for ischemic heart disease - EKG  Screening for hematuria/proteinuria - Routine UA with reflex microscopic - Microalbumin/creatinine urine ratio   Discussed med's effects and SE's. Screening labs and tests as requested with regular follow-up as recommended. Over 40 minutes of exam, counseling, chart review and critical decision making was performed Future Appointments  Date Time Provider Department Center  06/28/2023  2:00 PM Adela Glimpse, NP [facility] None  03/27/2024  2:00 PM Raynelle Dick, NP [facility] None    HPI Patient presents for CPE and follow up for HTN, chol, DM, morbid obesity, OSA, venous insuff.   He moved her from [place] in 2019 to be closer to his daughter, Clemetine Marker.  He had a work up for a possible  TIA in 2019, normal echo, normal MRI brain, he is on plavix due to this.  He has chronic edema due to his morbid obesity.   He had right shoulder replacement 2019 in [place], following with ortho here, had a reverse shoulder surgery with Dr. Ave Filter 01/24/20  Previous Ct showed hepatomegaly and fatty liver.   He is having neck pain and xray showed : Severe right and moderate left neuroforaminal narrowing at  C3-4 and C4-5. He has an appointment at [facility] for cervical injection on 04/04/23.   He has a history of peripheral neuropathy related to diabetes. He is having numbness, tingling and burning of lower legs from mid calf down.  He previously received some injections- uncertain of name but only helped for 1 month and was not covered by his insurance.    BMI is Body mass index is 44.96 kg/m., he is working on diet and exercise.  He has OSA and has tried CPAP and BiPAP, has not been able to tolerate due to feeling of suffocation. He does more walking since he has started working part time at [employer]. He is on Ozempic 2 mg SQ QW , phentermine 37.5 mg every day and Topiramate 50 mg BID. He has not lost any weight since last visit 2 months ago. He is trying to limit white bread, white rice, pasta, potatoes. He does drink sweet sodas.  Blood sugars have been running 100-116 fasting Wt Readings from Last 3 Encounters:  03/28/23 (!) 317 lb 12.8 oz (144.2 kg)  02/09/23 (!) 317 lb 3.2 oz (143.9 kg)  09/16/22 (!) 309 lb 12.8 oz (140.5 kg)   His blood pressure has been controlled at home, today their BP is BP: 112/62 He is currently on Carvedilol 12.5 mg every day and valsartan 160 mg qd. He was prescribed Furosemide 40 mg every day but is not taking because swelling has been controlled.  BP Readings from Last 3 Encounters:  03/28/23 112/62  02/09/23 104/60  12/26/22 126/72  He does not workout. He denies chest pain, shortness of breath, dizziness.    He is currently taking Ozempic 2 mg SQ QW He does have peripheral neuropathy related to diabetes He has been working on diet and exercise for diabetes, With CKD stage 2 Has not been checking his blood sugars- his machine broke Lab Results  Component Value Date   HGBA1C 6.3 (H) 03/24/2022   He has been drinking a lot of water  Lab Results  Component Value Date   EGFR 64 03/24/2022     With hyperlipidemia on Lipitor 80mg , Zetia 10mg    He is not on bASA but he is on plavix Carotid doppler 04/2019 less than 50% Retinal exam 03/2021 normal Last Cholesterol LDL was in goal, triglycerides remain elevated Lab Results  Component Value Date   CHOL 148 03/24/2022   HDL 38 (L) 03/24/2022   LDLCALC 80 03/24/2022   TRIG 199 (H) 03/24/2022   CHOLHDL 3.9 03/24/2022      Current Medications:   Current Outpatient Medications (Endocrine & Metabolic):    Semaglutide, 1 MG/DOSE, 4 MG/3ML SOPN, Inject 1 mg into the skin once a week.  Current Outpatient Medications (Cardiovascular):    atorvastatin (LIPITOR) 80 MG tablet, TAKE 1 TABLET BY MOUTH EVERY DAY FOR CHOLESTEROL   carvedilol (COREG) 12.5 MG tablet, Take 1 tablet  2 x /day (every 12 hours)  for BP & Heart   ezetimibe (ZETIA) 10 MG tablet, TAKE 1 TABLET BY MOUTH EVERY DAY  FOR CHOLESTEROL   furosemide (LASIX) 40 MG tablet, Take 1 tablet daily for BP & Fluid Retention / Ankle Swelling   valsartan (DIOVAN) 160 MG tablet, TAKE 1 TABLET DAILY FOR BP & DIABETIC KIDNEY PROTECTION  Current Outpatient Medications (Respiratory):    cetirizine (ZYRTEC ALLERGY) 10 MG tablet, Take 1 tablet (10 mg total) by mouth daily.   Current Outpatient Medications (Hematological):    Ferrous Sulfate (IRON) 325 (65 Fe) MG TABS, Take by mouth.  Current Outpatient Medications (Other):    betamethasone dipropionate 0.05 % cream, Apply topically 2 (two) times daily.   blood glucose meter kit and supplies KIT, Dispense based on patient and insurance preference. Use up to four times daily as directed.   Cholecalciferol 125 MCG (5000 UT) TABS, Take 5,000 Units by mouth daily.    Coenzyme Q10 200 MG capsule, Take 200 mg by mouth daily before breakfast.   cyclobenzaprine (FLEXERIL) 5 MG tablet, Take 1 tablet (5 mg total) by mouth 3 (three) times daily as needed for muscle spasms.   gabapentin (NEURONTIN) 100 MG capsule, TAKE 1 CAPSULE (100 MG TOTAL) BY MOUTH THREE TIMES DAILY.   glucose blood (ONETOUCH  VERIO) test strip, Check blood sugar 1 time a day-E11.22.   Lancets (ONETOUCH ULTRASOFT) lancets, Use as instructed   Multiple Vitamin (MULTIVITAMIN) tablet, Take 1 tablet by mouth daily.   omeprazole (PRILOSEC) 40 MG capsule, TAKE 1 CAPSULE DAILY TO PREVENT HEART BURN & INDIGESTION   phentermine (ADIPEX-P) 37.5 MG tablet, Take 1 tablet (37.5 mg total) by mouth daily before breakfast.   topiramate (TOPAMAX) 50 MG tablet, TAKE 1 TABLET BY MOUTH TWICE A DAY  Allergies:  Allergies  Allergen Reactions   Sulfamethoxazole-Trimethoprim Rash   Colonoscopy: 01/18/20 Precancerous Polyps follow up 3 years recommended Dr Rhea Belton Patient Care Team: Lucky Cowboy, MD as PCP - General (Internal Medicine) Charlett Nose, [facility] (Inactive) as Pharmacist (Pharmacist)  Medical History:  has Morbid obesity (HCC); Type 2 diabetes mellitus with hyperlipidemia (HCC); Vitamin D deficiency; GERD (gastroesophageal reflux disease); OSA (obstructive sleep apnea); Hypertension; CKD stage 2 due to type 2 diabetes mellitus (HCC); Medication management; Venous insufficiency; Fecal occult blood test positive; Microcytic anemia; Aortic atherosclerosis (HCC); Hepatic steatosis; Hepatomegaly; BMI 45.0-49.9, adult (HCC); TIA (transient ischemic attack); and Thrombophilia (HCC) on their problem list. Surgical History:  He  has a past surgical history that includes Replacement total knee (Left); Total shoulder replacement (Right); Tonsillectomy; Colonoscopy (2010); and Total shoulder arthroplasty (Left, 02/07/2020). Family History:  His family history includes COPD in his mother and sister; Cancer in his mother; Cancer (age of onset: 37) in his father; Colon cancer in his father; Crohn's disease in his son; Diabetes in his father; Heart disease in his father; Hyperlipidemia in his father and mother; Hypertension in his father and mother; Osteoporosis in his mother; Prostate cancer in his father.   Social History:   reports that he  has never smoked. He has never used smokeless tobacco. He reports that he does not currently use alcohol. He reports that he does not use drugs. Review of Systems:  Review of Systems  Constitutional: Negative.  Negative for chills, fever, malaise/fatigue and weight loss.  HENT: Negative.  Negative for congestion, ear discharge, hearing loss, sore throat and tinnitus.   Eyes: Negative.  Negative for blurred vision, discharge and redness.  Respiratory:  Negative for cough, hemoptysis, sputum production, shortness of breath (with walking due to body habitus) and wheezing.   Cardiovascular:  Negative for chest pain, palpitations,  orthopnea, leg swelling and PND.  Gastrointestinal:  Negative for abdominal pain, blood in stool, constipation, diarrhea, heartburn (has hiatal hernia), melena, nausea and vomiting.  Genitourinary: Negative.  Negative for dysuria, frequency and urgency.  Musculoskeletal:  Positive for back pain, myalgias and neck pain. Negative for falls and joint pain.  Skin:  Negative for rash.  Neurological:  Positive for tingling (midcalf to foot bilaterally) and sensory change (midcalf to foot bilaterally). Negative for dizziness, seizures, weakness and headaches.  Endo/Heme/Allergies: Negative.  Does not bruise/bleed easily.  Psychiatric/Behavioral: Negative.  Negative for depression, hallucinations, memory loss and suicidal ideas. The patient does not have insomnia.     Physical Exam: Estimated body mass index is 44.96 kg/m as calculated from the following:   Height as of this encounter: 5' 10.5" (1.791 m).   Weight as of this encounter: 317 lb 12.8 oz (144.2 kg). BP 112/62   Pulse 62   Temp (!) 97.5 F (36.4 C)   Ht 5' 10.5" (1.791 m)   Wt (!) 317 lb 12.8 oz (144.2 kg)   SpO2 95%   BMI 44.96 kg/m  General Appearance: Obese WM, in no apparent distress.  Eyes: PERRLA, EOMs, conjunctiva no swelling or erythema Sinuses: No Frontal/maxillary tenderness  ENT/Mouth: Ext aud  canals clear, normal light reflex with TMs without erythema, bulging. Pharynx without swelling, erythema or exudate. Hearing normal.  Neck: Supple, thyroid normal. No bruits  Respiratory: Respiratory effort normal, BS decreased due to body habitus but equal bilaterally without rales, rhonchi, wheezing or stridor.  Cardio: RRR without murmurs, rubs or gallops. Brisk DP 2+ , 1+ edema with improved venous stasis bilateral legs, no warmth, no erythema, no hard cord.  Chest: symmetric, with normal excursions and percussion.  Abdomen: Difficult to examine due to body habitus. Soft, obese, nontender, no guarding, rebound, hernias, masses, or organomegaly.  Lymphatics: Non tender without lymphadenopathy.  Genitourinary: defer Musculoskeletal: Full ROM all peripheral extremities,antalgic gait, decreased movement right shoulder due to pain.  Skin: Warm, dry. Neuro: Cranial nerves intact, reflexes equal bilaterally. Normal muscle tone, no cerebellar symptoms. Sensation intact with monofilament bilateral feet without ulcers. Marland Kitchen  Psych: Awake and oriented X 3, normal affect, Insight and Judgment appropriate.  Foot Exam :Performed with monofilament- sensation intact bilaterally EKG: Junctional rhythm, no ST changes.  AAA: < 3 cm  Gaynor Ferreras E Aundria Rud 2:08 PM [facility]

## 2023-03-29 LAB — COMPLETE METABOLIC PANEL WITH GFR
AG Ratio: 1.8 (calc) (ref 1.0–2.5)
ALT: 20 U/L (ref 9–46)
AST: 20 U/L (ref 10–35)
Albumin: 4.5 g/dL (ref 3.6–5.1)
Alkaline phosphatase (APISO): 89 U/L (ref 35–144)
BUN: 23 mg/dL (ref 7–25)
CO2: 25 mmol/L (ref 20–32)
Calcium: 9.7 mg/dL (ref 8.6–10.3)
Chloride: 106 mmol/L (ref 98–110)
Creat: 1.14 mg/dL (ref 0.70–1.35)
Globulin: 2.5 g/dL (calc) (ref 1.9–3.7)
Glucose, Bld: 97 mg/dL (ref 65–99)
Potassium: 4.6 mmol/L (ref 3.5–5.3)
Sodium: 139 mmol/L (ref 135–146)
Total Bilirubin: 0.5 mg/dL (ref 0.2–1.2)
Total Protein: 7 g/dL (ref 6.1–8.1)
eGFR: 70 mL/min/{1.73_m2} (ref >=60)

## 2023-03-29 LAB — MICROALBUMIN / CREATININE URINE RATIO
Creatinine, Urine: 143 mg/dL (ref 20–320)
Microalb, Ur: <0.2 mg/dL

## 2023-03-29 LAB — LIPID PANEL
Cholesterol: 156 mg/dL (ref <200)
HDL: 40 mg/dL (ref >=40)
LDL Cholesterol (Calc): 85 mg/dL (calc)
Non-HDL Cholesterol (Calc): 116 mg/dL (calc) (ref <130)
Total CHOL/HDL Ratio: 3.9 (calc) (ref <5.0)
Triglycerides: 214 mg/dL — ABNORMAL HIGH (ref <150)

## 2023-03-29 LAB — URINALYSIS, ROUTINE W REFLEX MICROSCOPIC
Bilirubin Urine: NEGATIVE
Glucose, UA: NEGATIVE
Hgb urine dipstick: NEGATIVE
Ketones, ur: NEGATIVE
Leukocytes,Ua: NEGATIVE
Nitrite: NEGATIVE
Protein, ur: NEGATIVE
Specific Gravity, Urine: 1.02 (ref 1.001–1.035)
pH: 5.5 (ref 5.0–8.0)

## 2023-03-29 LAB — MAGNESIUM: Magnesium: 2 mg/dL (ref 1.5–2.5)

## 2023-03-29 LAB — CBC WITH DIFFERENTIAL/PLATELET
Eosinophils Relative: 3.6 %
HCT: 44.2 % (ref 38.5–50.0)
Hemoglobin: 14.2 g/dL (ref 13.2–17.1)
Lymphs Abs: 2049 cells/uL (ref 850–3900)
MCHC: 32.1 g/dL (ref 32.0–36.0)
MCV: 82 fL (ref 80.0–100.0)
MPV: 9.6 fL (ref 7.5–12.5)
Neutro Abs: 6223 cells/uL (ref 1500–7800)
Platelets: 289 10*3/uL (ref 140–400)
Total Lymphocyte: 21.8 %
WBC: 9.4 10*3/uL (ref 3.8–10.8)

## 2023-03-29 LAB — HEMOGLOBIN A1C W/OUT EAG: Hgb A1c MFr Bld: 6.5 % of total Hgb — ABNORMAL HIGH (ref <5.7)

## 2023-03-29 LAB — TSH: TSH: 0.78 mIU/L (ref 0.40–4.50)

## 2023-03-29 LAB — VITAMIN D 25 HYDROXY (VIT D DEFICIENCY, FRACTURES): Vit D, 25-Hydroxy: 50 ng/mL (ref 30–100)

## 2023-04-04 DIAGNOSIS — M5412 Radiculopathy, cervical region: Secondary | ICD-10-CM | POA: Diagnosis not present

## 2023-04-04 DIAGNOSIS — M47812 Spondylosis without myelopathy or radiculopathy, cervical region: Secondary | ICD-10-CM | POA: Diagnosis not present

## 2023-04-04 DIAGNOSIS — M542 Cervicalgia: Secondary | ICD-10-CM | POA: Diagnosis not present

## 2023-04-26 ENCOUNTER — Other Ambulatory Visit: Payer: Self-pay | Admitting: Nurse Practitioner

## 2023-04-27 ENCOUNTER — Other Ambulatory Visit: Payer: Self-pay | Admitting: Nurse Practitioner

## 2023-04-27 DIAGNOSIS — I10 Essential (primary) hypertension: Secondary | ICD-10-CM

## 2023-04-27 DIAGNOSIS — Z79899 Other long term (current) drug therapy: Secondary | ICD-10-CM

## 2023-04-28 DIAGNOSIS — M542 Cervicalgia: Secondary | ICD-10-CM | POA: Diagnosis not present

## 2023-05-02 DIAGNOSIS — M542 Cervicalgia: Secondary | ICD-10-CM | POA: Diagnosis not present

## 2023-05-09 DIAGNOSIS — M542 Cervicalgia: Secondary | ICD-10-CM | POA: Diagnosis not present

## 2023-05-20 DIAGNOSIS — M542 Cervicalgia: Secondary | ICD-10-CM | POA: Diagnosis not present

## 2023-05-24 DIAGNOSIS — M542 Cervicalgia: Secondary | ICD-10-CM | POA: Diagnosis not present

## 2023-05-31 ENCOUNTER — Encounter: Payer: Self-pay | Admitting: Nurse Practitioner

## 2023-05-31 ENCOUNTER — Other Ambulatory Visit: Payer: Self-pay | Admitting: Nurse Practitioner

## 2023-05-31 DIAGNOSIS — E1142 Type 2 diabetes mellitus with diabetic polyneuropathy: Secondary | ICD-10-CM

## 2023-05-31 MED ORDER — OZEMPIC (2 MG/DOSE) 8 MG/3ML ~~LOC~~ SOPN
2.0000 mg | PEN_INJECTOR | SUBCUTANEOUS | 2 refills | Status: DC
Start: 2023-05-31 — End: 2023-06-01

## 2023-06-01 ENCOUNTER — Other Ambulatory Visit: Payer: Self-pay | Admitting: Nurse Practitioner

## 2023-06-01 DIAGNOSIS — E1142 Type 2 diabetes mellitus with diabetic polyneuropathy: Secondary | ICD-10-CM

## 2023-06-01 MED ORDER — OZEMPIC (2 MG/DOSE) 8 MG/3ML ~~LOC~~ SOPN
2.0000 mg | PEN_INJECTOR | SUBCUTANEOUS | 2 refills | Status: DC
Start: 2023-06-01 — End: 2023-07-27

## 2023-06-07 ENCOUNTER — Other Ambulatory Visit: Payer: Self-pay | Admitting: Nurse Practitioner

## 2023-06-07 DIAGNOSIS — Z79899 Other long term (current) drug therapy: Secondary | ICD-10-CM

## 2023-06-07 DIAGNOSIS — E782 Mixed hyperlipidemia: Secondary | ICD-10-CM

## 2023-06-07 DIAGNOSIS — G4730 Sleep apnea, unspecified: Secondary | ICD-10-CM

## 2023-06-08 ENCOUNTER — Other Ambulatory Visit: Payer: Self-pay | Admitting: Nurse Practitioner

## 2023-06-08 DIAGNOSIS — E1169 Type 2 diabetes mellitus with other specified complication: Secondary | ICD-10-CM

## 2023-06-08 DIAGNOSIS — Z79899 Other long term (current) drug therapy: Secondary | ICD-10-CM

## 2023-06-13 ENCOUNTER — Other Ambulatory Visit: Payer: Self-pay | Admitting: Nurse Practitioner

## 2023-06-14 DIAGNOSIS — M47812 Spondylosis without myelopathy or radiculopathy, cervical region: Secondary | ICD-10-CM | POA: Diagnosis not present

## 2023-06-14 DIAGNOSIS — M5412 Radiculopathy, cervical region: Secondary | ICD-10-CM | POA: Diagnosis not present

## 2023-06-15 DIAGNOSIS — M5412 Radiculopathy, cervical region: Secondary | ICD-10-CM | POA: Diagnosis not present

## 2023-06-23 DIAGNOSIS — M5412 Radiculopathy, cervical region: Secondary | ICD-10-CM | POA: Diagnosis not present

## 2023-06-23 DIAGNOSIS — M47812 Spondylosis without myelopathy or radiculopathy, cervical region: Secondary | ICD-10-CM | POA: Diagnosis not present

## 2023-06-23 DIAGNOSIS — M542 Cervicalgia: Secondary | ICD-10-CM | POA: Diagnosis not present

## 2023-06-28 ENCOUNTER — Ambulatory Visit: Payer: PPO | Admitting: Nurse Practitioner

## 2023-07-06 DIAGNOSIS — M5412 Radiculopathy, cervical region: Secondary | ICD-10-CM | POA: Diagnosis not present

## 2023-07-27 ENCOUNTER — Other Ambulatory Visit: Payer: Self-pay | Admitting: Nurse Practitioner

## 2023-07-27 DIAGNOSIS — E1142 Type 2 diabetes mellitus with diabetic polyneuropathy: Secondary | ICD-10-CM

## 2023-07-28 ENCOUNTER — Ambulatory Visit (INDEPENDENT_AMBULATORY_CARE_PROVIDER_SITE_OTHER): Payer: PPO | Admitting: Nurse Practitioner

## 2023-07-28 ENCOUNTER — Encounter: Payer: Self-pay | Admitting: Nurse Practitioner

## 2023-07-28 VITALS — BP 110/60 | HR 59 | Temp 97.9°F | Ht 70.5 in | Wt 323.0 lb

## 2023-07-28 DIAGNOSIS — N401 Enlarged prostate with lower urinary tract symptoms: Secondary | ICD-10-CM | POA: Insufficient documentation

## 2023-07-28 DIAGNOSIS — Z0000 Encounter for general adult medical examination without abnormal findings: Secondary | ICD-10-CM

## 2023-07-28 DIAGNOSIS — I10 Essential (primary) hypertension: Secondary | ICD-10-CM | POA: Diagnosis not present

## 2023-07-28 DIAGNOSIS — G4733 Obstructive sleep apnea (adult) (pediatric): Secondary | ICD-10-CM

## 2023-07-28 DIAGNOSIS — M4802 Spinal stenosis, cervical region: Secondary | ICD-10-CM

## 2023-07-28 DIAGNOSIS — E1169 Type 2 diabetes mellitus with other specified complication: Secondary | ICD-10-CM

## 2023-07-28 DIAGNOSIS — R6889 Other general symptoms and signs: Secondary | ICD-10-CM | POA: Diagnosis not present

## 2023-07-28 DIAGNOSIS — E1142 Type 2 diabetes mellitus with diabetic polyneuropathy: Secondary | ICD-10-CM

## 2023-07-28 DIAGNOSIS — Z79899 Other long term (current) drug therapy: Secondary | ICD-10-CM | POA: Diagnosis not present

## 2023-07-28 DIAGNOSIS — Z0001 Encounter for general adult medical examination with abnormal findings: Secondary | ICD-10-CM | POA: Diagnosis not present

## 2023-07-28 DIAGNOSIS — D6859 Other primary thrombophilia: Secondary | ICD-10-CM | POA: Diagnosis not present

## 2023-07-28 DIAGNOSIS — K2100 Gastro-esophageal reflux disease with esophagitis, without bleeding: Secondary | ICD-10-CM

## 2023-07-28 DIAGNOSIS — G459 Transient cerebral ischemic attack, unspecified: Secondary | ICD-10-CM

## 2023-07-28 DIAGNOSIS — K760 Fatty (change of) liver, not elsewhere classified: Secondary | ICD-10-CM

## 2023-07-28 DIAGNOSIS — I700 Atherosclerosis of aorta: Secondary | ICD-10-CM

## 2023-07-28 DIAGNOSIS — E1122 Type 2 diabetes mellitus with diabetic chronic kidney disease: Secondary | ICD-10-CM | POA: Diagnosis not present

## 2023-07-28 DIAGNOSIS — E559 Vitamin D deficiency, unspecified: Secondary | ICD-10-CM

## 2023-07-28 DIAGNOSIS — E785 Hyperlipidemia, unspecified: Secondary | ICD-10-CM | POA: Diagnosis not present

## 2023-07-28 DIAGNOSIS — N182 Chronic kidney disease, stage 2 (mild): Secondary | ICD-10-CM | POA: Diagnosis not present

## 2023-07-28 MED ORDER — OZEMPIC (2 MG/DOSE) 8 MG/3ML ~~LOC~~ SOPN
PEN_INJECTOR | SUBCUTANEOUS | 1 refills | Status: DC
Start: 2023-07-28 — End: 2023-11-30

## 2023-07-28 MED ORDER — BETAMETHASONE DIPROPIONATE 0.05 % EX CREA
TOPICAL_CREAM | Freq: Two times a day (BID) | CUTANEOUS | 0 refills | Status: DC
Start: 2023-07-28 — End: 2023-11-09

## 2023-07-28 MED ORDER — PHENTERMINE HCL 37.5 MG PO TABS
37.5000 mg | ORAL_TABLET | Freq: Every day | ORAL | 2 refills | Status: DC
Start: 2023-07-28 — End: 2023-11-11

## 2023-07-28 NOTE — Progress Notes (Signed)
ANNUAL WELLNESS  Assessment and Plan:  Annual Wellness Due annually  Health maintenance reviewed Healthily lifestyle goals set  Essential hypertension Continue medications Coreg, Valsartan Discussed DASH (Dietary Approaches to Stop Hypertension) DASH diet is lower in sodium than a typical American diet. Cut back on foods that are high in saturated fat, cholesterol, and trans fats. Eat more whole-grain foods, fish, poultry, and nuts Remain active and exercise as tolerated daily.  Monitor BP at home-Call if greater than 130/80.  Check CMP/CBC  Acquired Thrombophilia (HCC) MONITOR- patient on plavix Check CBC  Aortic atherosclerosis (HCC)/Hyperlipidemia with DM Discussed lifestyle modifications, continue Atorvastatin.  Recommended diet heavy in fruits and veggies, omega 3's. Decrease consumption of animal meats, cheeses, and dairy products. Remain active and exercise as tolerated. Continue to monitor. Check/monitor lipids/TSH  Morbid obesity (HCC) Continue medications topamax and phentermine  Continue Ozempic Discussed appropriate BMI Diet modification. Physical activity. Encouraged/praised to build confidence. Patient wishes to be 295 lb short term goal.  CKD stage 2 due to type 2 diabetes mellitus (HCC) Continue medications Discussed how what you eat and drink can aide in kidney protection. Stay well hydrated. Avoid high salt foods. Avoid NSAIDS. Keep BP and BG well controlled.   Take medications as prescribed. Remain active and exercise as tolerated daily. Maintain weight.  Continue to monitor. 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 Continue medications, Gabapentin Regularly perform feet checks  Medication management All medications discussed and reviewed in full. All questions and concerns  regarding medications addressed.    Hepatic steatosis with Hepatomegaly Avoid tylenol, alcohol, weight loss advised.  Check/monitor CMP  Gastroesophageal reflux disease with esophagitis without hemorrhage Past due as of 01/2023 - last 2021 with 3 year recall - due for colonoscopy - patient to reach out to Dr. Rhea Belton for updated schedule  No suspected reflux complications (Barret/stricture). Lifestyle modification:  wt loss, avoid meals 2-3h before bedtime. Consider eliminating food triggers:  chocolate, caffeine, EtOH, acid/spicy food.  OSA (obstructive sleep apnea) Unable to tolerate CPAP Long discussion about how it is harder to manage his chronic co morbidities if he is not treating OSA Continue weight loss  HX of TIA (transient ischemic attack) Continue plavix Control blood pressure, cholesterol, glucose, increase exercise.   BPH with LUTS Monitor PSA  Cervicalgia with spinal stenosis Continue to follow with Orthopedics  Orders Placed This Encounter  Procedures   CBC with Differential/Platelet   COMPLETE METABOLIC PANEL WITH GFR   Lipid panel   Hemoglobin A1c   Meds ordered this encounter  Medications   betamethasone dipropionate 0.05 % cream    Sig: Apply topically 2 (two) times daily.    Dispense:  30 g    Refill:  0   OZEMPIC, 2 MG/DOSE, 8 MG/3ML SOPN    Sig: INJECT TWO MG INTO THE SKIN ONCE A WEEK    Dispense:  2 mL    Refill:  1   phentermine (ADIPEX-P) 37.5 MG tablet    Sig: Take 1 tablet (37.5 mg total) by mouth daily before breakfast.    Dispense:  30 tablet    Refill:  2     Plan:   During the course of the visit the patient was educated and counseled about appropriate screening and preventive services including:   Pneumococcal vaccine  Prevnar 13 Influenza vaccine Td vaccine Screening electrocardiogram Bone densitometry screening Colorectal cancer  screening Diabetes screening Glaucoma screening Nutrition counseling  Advanced directives:  requested  HPI  Patient presents for annual wellness and follow up for HTN, chol, DM, morbid obesity, OSA, venous insuff.    He moved her from [place] in 2019 to be closer to his daughter, Clemetine Marker.   He is continuing to work part-time.  This keeps him busy and active.  He does not follow with Dermatology but has followed in the past.  Has not noticed any new spots, nodules, growths, skin changes.    He had a work up for a possible TIA in 2019, normal echo, normal MRI brain, he is on plavix due to this.   He has chronic edema due to his morbid obesity.   He had right shoulder replacement 2019 in [place], following with ortho here, had a reverse shoulder surgery with Dr. Ave Filter 01/24/20  Previous Ct showed hepatomegaly and fatty liver.   He has been experiencing pain in his right thumb along with neck pain. Cervical x-ray 01/2023 reveals foraminal narrowing C3-C5. Recently seen [employer].  He is not wishing to have further intervention/surgery at this time.  Also having numbness, tingling and burning of lower legs from knee down.  He was placed on Gabapentin 100 mg  with mild improvement.  Recently had steroid injection that he feels did not take well.    BMI is Body mass index is 45.69 kg/m., he is working on diet and exercise. Did not refill Phentermine however requesting to restart d/t recent weight gain.  He has OSA and has tried CPAP and BiPAP, has not been able to tolerate due to feeling of suffocation. Wt Readings from Last 3 Encounters:  07/28/23 (!) 323 lb (146.5 kg)  03/28/23 (!) 317 lb 12.8 oz (144.2 kg)  02/09/23 (!) 317 lb 3.2 oz (143.9 kg)   His blood pressure has been controlled at home, today their BP is BP: 110/60 He is currently on Carvedilol 12.5 mg BID, Furosemide 40 mg QD, and valsartan 160 mg qd. BP Readings from Last 3 Encounters:  07/28/23 110/60  03/28/23 112/62  02/09/23 104/60  He does not workout. He denies chest pain, shortness of breath,  dizziness.    He is on cholesterol medication, Ezetimibe 10 mg and Atorvastatin 80 mg QD and denies myalgias. He has been working on diet and exercise for diabetes, With CKD stage 2 Has not been checking his blood sugars.  Ozempic 2 mg SQ QW. Last A1c: Lab Results  Component Value Date   HGBA1C 6.5 (H) 03/28/2023    Lab Results  Component Value Date   EGFR 70 03/28/2023     With hyperlipidemia on Lipitor 80mg , Zetia 10mg   He is not on bASA but he is on plavix Carotid doppler 04/2019 less than 50% Retinal exam 03/2022 normal Last Cholesterol LDL was in goal, triglycerides remain elevated  Lab Results  Component Value Date   CHOL 156 03/28/2023   HDL 40 03/28/2023   LDLCALC 85 03/28/2023   TRIG 214 (H) 03/28/2023   CHOLHDL 3.9 03/28/2023      Current Medications:   Current Outpatient Medications (Endocrine & Metabolic):    OZEMPIC, 2 MG/DOSE, 8 MG/3ML SOPN, INJECT TWO MG INTO THE SKIN ONCE A WEEK  Current Outpatient Medications (Cardiovascular):    atorvastatin (LIPITOR) 80 MG tablet, TAKE 1 TABLET BY MOUTH EVERY DAY FOR CHOLESTEROL   carvedilol (COREG) 12.5 MG tablet, TAKE 1 TABLET BY MOUTH 2 X /DAY (EVERY 12 HOURS) FOR BP &  HEART (Patient taking differently: daily.)   ezetimibe (ZETIA) 10 MG tablet, TAKE 1 TABLET BY MOUTH EVERY DAY FOR CHOLESTEROL   valsartan (DIOVAN) 160 MG tablet, TAKE 1 TABLET DAILY FOR BP & DIABETIC KIDNEY PROTECTION   furosemide (LASIX) 40 MG tablet, Take 1 tablet daily for BP & Fluid Retention / Ankle Swelling (Patient not taking: Reported on 03/28/2023)  Current Outpatient Medications (Respiratory):    cetirizine (ZYRTEC ALLERGY) 10 MG tablet, Take 1 tablet (10 mg total) by mouth daily.   Current Outpatient Medications (Hematological):    clopidogrel (PLAVIX) 75 MG tablet, TAKE 1 TABLET BY MOUTH EVERY DAY   Ferrous Sulfate (IRON) 325 (65 Fe) MG TABS, Take by mouth.  Current Outpatient Medications (Other):    blood glucose meter kit and supplies  KIT, Dispense based on patient and insurance preference. Use up to four times daily as directed.   Cholecalciferol 125 MCG (5000 UT) TABS, Take 5,000 Units by mouth daily.    Coenzyme Q10 200 MG capsule, Take 200 mg by mouth daily before breakfast.   gabapentin (NEURONTIN) 100 MG capsule, TAKE 1 CAPSULE (100 MG TOTAL) BY MOUTH THREE TIMES DAILY.   glucose blood (ONETOUCH VERIO) test strip, Check blood sugar 1 time a day-E11.22.   Lancets (ONETOUCH ULTRASOFT) lancets, Use as instructed   Multiple Vitamin (MULTIVITAMIN) tablet, Take 1 tablet by mouth daily.   omeprazole (PRILOSEC) 40 MG capsule, TAKE 1 CAPSULE DAILY TO PREVENT HEART BURN & INDIGESTION   topiramate (TOPAMAX) 50 MG tablet, TAKE 1 TABLET BY MOUTH TWICE A DAY   betamethasone dipropionate 0.05 % cream, Apply topically 2 (two) times daily.   cyclobenzaprine (FLEXERIL) 5 MG tablet, Take 1 tablet (5 mg total) by mouth 3 (three) times daily as needed for muscle spasms. (Patient not taking: Reported on 07/28/2023)   phentermine (ADIPEX-P) 37.5 MG tablet, Take 1 tablet (37.5 mg total) by mouth daily before breakfast.  Allergies:  Allergies  Allergen Reactions   Sulfamethoxazole-Trimethoprim Rash   Colonoscopy: 01/18/20 Precancerous Polyps follow up 3 years recommended Dr Rhea Belton Due 2024 - past due  Eye Exam:  2023 Dental Exam:  Will follow as needed - defers yearly skin checks.  Patient Care Team: Lucky Cowboy, MD as PCP - General (Internal Medicine) Charlett Nose, [facility] (Inactive) as Pharmacist (Pharmacist)  Medical History:  has Morbid obesity [facility]); Type 2 diabetes mellitus with hyperlipidemia (HCC); Vitamin D deficiency; GERD (gastroesophageal reflux disease); OSA (obstructive sleep apnea); Hypertension; CKD stage 2 due to type 2 diabetes mellitus (HCC); Medication management; Venous insufficiency; Fecal occult blood test positive; Microcytic anemia; Aortic atherosclerosis (HCC); Hepatic steatosis; Hepatomegaly; BMI 45.0-49.9,  adult (HCC); TIA (transient ischemic attack); Thrombophilia (HCC); and Benign prostatic hyperplasia with lower urinary tract symptoms on their problem list. Surgical History:  He  has a past surgical history that includes Replacement total knee (Left); Total shoulder replacement (Right); Tonsillectomy; Colonoscopy (2010); and Total shoulder arthroplasty (Left, 02/07/2020). Family History:  His family history includes COPD in his mother and sister; Cancer in his mother; Cancer (age of onset: 24) in his father; Colon cancer in his father; Crohn's disease in his son; Diabetes in his father; Heart disease in his father; Hyperlipidemia in his father and mother; Hypertension in his father and mother; Osteoporosis in his mother; Prostate cancer in his father.   Social History:   reports that he has never smoked. He has never used smokeless tobacco. He reports that he does not currently use alcohol. He reports that he does not use  drugs. Review of Systems:  Review of Systems  Constitutional: Negative.  Negative for chills, fever, malaise/fatigue and weight loss.  HENT: Negative.  Negative for congestion, ear discharge, hearing loss, sore throat and tinnitus.   Eyes: Negative.  Negative for blurred vision, discharge and redness.  Respiratory:  Negative for cough, hemoptysis, sputum production, shortness of breath (with walking due to body habitus) and wheezing.   Cardiovascular:  Positive for leg swelling (improved). Negative for chest pain, palpitations, orthopnea and PND.  Gastrointestinal:  Negative for abdominal pain, blood in stool, constipation, diarrhea, heartburn (has hiatal hernia), melena, nausea and vomiting.  Genitourinary: Negative.  Negative for dysuria, frequency and urgency.  Musculoskeletal:  Positive for back pain, joint pain (Right thumb) and myalgias. Negative for falls and neck pain.  Skin:  Positive for rash.  Neurological:  Positive for tingling (midcalf bilaterally) and sensory  change (midcalf bilaterally). Negative for dizziness, seizures, weakness and headaches.  Endo/Heme/Allergies: Negative.  Does not bruise/bleed easily.  Psychiatric/Behavioral: Negative.  Negative for depression, hallucinations, memory loss and suicidal ideas. The patient does not have insomnia.    MEDICARE WELLNESS OBJECTIVES: Physical activity: Current Exercise Habits: The patient does not participate in regular exercise at present Cardiac risk factors:   Depression/mood screen:      07/28/2023    9:23 AM  Depression screen PHQ 2/9  Decreased Interest 0  Down, Depressed, Hopeless 0  PHQ - 2 Score 0    ADLs:     07/28/2023   10:20 AM  In your present state of health, do you have any difficulty performing the following activities:  Hearing? 0  Vision? 0  Difficulty concentrating or making decisions? 0  Walking or climbing stairs? 0  Dressing or bathing? 0  Doing errands, shopping? 0     Cognitive Testing  Alert? Yes  Normal Appearance?Yes  Oriented to person? Yes  Place? Yes   Time? Yes  Recall of three objects?  Yes  Can perform simple calculations? Yes  Displays appropriate judgment?Yes  Can read the correct time from a watch face?Yes  EOL planning: Does Patient Have a Medical Advance Directive?: No    Physical Exam: Estimated body mass index is 45.69 kg/m as calculated from the following:   Height as of this encounter: 5' 10.5" (1.791 m).   Weight as of this encounter: 323 lb (146.5 kg). BP 110/60   Pulse (!) 59   Temp 97.9 F (36.6 C)   Ht 5' 10.5" (1.791 m)   Wt (!) 323 lb (146.5 kg)   SpO2 96%   BMI 45.69 kg/m  General Appearance: Obese WM, in no apparent distress.  Eyes: PERRLA, EOMs, conjunctiva no swelling or erythema, normal fundi and vessels.  Sinuses: No Frontal/maxillary tenderness  ENT/Mouth: Ext aud canals clear, normal light reflex with TMs without erythema, bulging. Mouth and nose not examined- patient wearing a facemask. Hearing normal.   Neck: Supple, thyroid normal. No bruits  Respiratory: Respiratory effort normal, BS decreased due to body habitus but equal bilaterally without rales, rhonchi, wheezing or stridor.  Cardio: RRR without murmurs, rubs or gallops. Brisk DP 2+ , 1+ edema with improved venous stasis bilateral legs, no warmth, no erythema, no hard cord.  Chest: symmetric, with normal excursions and percussion.  Abdomen: Soft, obese, nontender, no guarding, rebound, hernias, masses, or organomegaly.  Lymphatics: Non tender without lymphadenopathy.  Genitourinary: defer Musculoskeletal: Full ROM all peripheral extremities,antalgic gait, decreased movement right shoulder due to pain.  Skin: Warm, dry. Neuro: Cranial nerves  intact, reflexes equal bilaterally. Normal muscle tone, no cerebellar symptoms. Sensation intact with monofilament bilateral feet without ulcers. Marland Kitchen  Psych: Awake and oriented X 3, normal affect, Insight and Judgment appropriate.    Medicare Attestation I have personally reviewed: The patient's medical and social history Their use of alcohol, tobacco or illicit drugs Their current medications and supplements The patient's functional ability including ADLs,fall risks, home safety risks, cognitive, and hearing and visual impairment Diet and physical activities Evidence for depression or mood disorders  The patient's weight, height, BMI, and visual acuity have been recorded in the chart.  I have made referrals, counseling, and provided education to the patient based on review of the above and I have provided the patient with a written personalized care plan for preventive services.     Willodean Leven 10:20 AM [facility]

## 2023-07-28 NOTE — Patient Instructions (Signed)

## 2023-07-29 LAB — COMPLETE METABOLIC PANEL WITH GFR
AG Ratio: 1.8 (calc) (ref 1.0–2.5)
ALT: 26 U/L (ref 9–46)
AST: 21 U/L (ref 10–35)
Albumin: 4.3 g/dL (ref 3.6–5.1)
Alkaline phosphatase (APISO): 98 U/L (ref 35–144)
BUN: 23 mg/dL (ref 7–25)
CO2: 26 mmol/L (ref 20–32)
Calcium: 9.6 mg/dL (ref 8.6–10.3)
Chloride: 108 mmol/L (ref 98–110)
Creat: 1.14 mg/dL (ref 0.70–1.35)
Globulin: 2.4 g/dL (ref 1.9–3.7)
Glucose, Bld: 155 mg/dL — ABNORMAL HIGH (ref 65–99)
Potassium: 4.7 mmol/L (ref 3.5–5.3)
Sodium: 140 mmol/L (ref 135–146)
Total Bilirubin: 0.4 mg/dL (ref 0.2–1.2)
Total Protein: 6.7 g/dL (ref 6.1–8.1)
eGFR: 70 mL/min/{1.73_m2} (ref >=60)

## 2023-07-29 LAB — CBC WITH DIFFERENTIAL/PLATELET
Absolute Lymphocytes: 1637 {cells}/uL (ref 850–3900)
Absolute Monocytes: 475 {cells}/uL (ref 200–950)
Basophils Absolute: 53 {cells}/uL (ref 0–200)
Basophils Relative: 0.6 %
Eosinophils Absolute: 299 {cells}/uL (ref 15–500)
Eosinophils Relative: 3.4 %
HCT: 45.4 % (ref 38.5–50.0)
Hemoglobin: 14.5 g/dL (ref 13.2–17.1)
MCH: 27.1 pg (ref 27.0–33.0)
MCHC: 31.9 g/dL — ABNORMAL LOW (ref 32.0–36.0)
MCV: 84.9 fL (ref 80.0–100.0)
MPV: 10 fL (ref 7.5–12.5)
Monocytes Relative: 5.4 %
Neutro Abs: 6336 {cells}/uL (ref 1500–7800)
Neutrophils Relative %: 72 %
Platelets: 305 10*3/uL (ref 140–400)
RBC: 5.35 10*6/uL (ref 4.20–5.80)
RDW: 14.1 % (ref 11.0–15.0)
Total Lymphocyte: 18.6 %
WBC: 8.8 10*3/uL (ref 3.8–10.8)

## 2023-07-29 LAB — LIPID PANEL
Cholesterol: 142 mg/dL (ref <200)
HDL: 37 mg/dL — ABNORMAL LOW (ref >=40)
LDL Cholesterol (Calc): 66 mg/dL
Non-HDL Cholesterol (Calc): 105 mg/dL (ref <130)
Total CHOL/HDL Ratio: 3.8 (calc) (ref <5.0)
Triglycerides: 320 mg/dL — ABNORMAL HIGH (ref <150)

## 2023-07-29 LAB — HEMOGLOBIN A1C
Hgb A1c MFr Bld: 6.8 %{Hb} — ABNORMAL HIGH (ref <5.7)
Mean Plasma Glucose: 148 mg/dL
eAG (mmol/L): 8.2 mmol/L

## 2023-08-15 ENCOUNTER — Ambulatory Visit: Payer: PPO | Admitting: Physician Assistant

## 2023-08-16 ENCOUNTER — Encounter: Payer: Self-pay | Admitting: Physician Assistant

## 2023-08-16 ENCOUNTER — Ambulatory Visit (INDEPENDENT_AMBULATORY_CARE_PROVIDER_SITE_OTHER): Payer: PPO | Admitting: Physician Assistant

## 2023-08-16 VITALS — BP 119/76 | HR 57 | Temp 98.1°F | Ht 70.5 in | Wt 313.0 lb

## 2023-08-16 DIAGNOSIS — I152 Hypertension secondary to endocrine disorders: Secondary | ICD-10-CM

## 2023-08-16 DIAGNOSIS — E785 Hyperlipidemia, unspecified: Secondary | ICD-10-CM

## 2023-08-16 DIAGNOSIS — E1159 Type 2 diabetes mellitus with other circulatory complications: Secondary | ICD-10-CM

## 2023-08-16 DIAGNOSIS — I700 Atherosclerosis of aorta: Secondary | ICD-10-CM

## 2023-08-16 DIAGNOSIS — E1143 Type 2 diabetes mellitus with diabetic autonomic (poly)neuropathy: Secondary | ICD-10-CM

## 2023-08-16 DIAGNOSIS — Z7985 Long-term (current) use of injectable non-insulin antidiabetic drugs: Secondary | ICD-10-CM

## 2023-08-16 DIAGNOSIS — Z1211 Encounter for screening for malignant neoplasm of colon: Secondary | ICD-10-CM

## 2023-08-16 DIAGNOSIS — Z8673 Personal history of transient ischemic attack (TIA), and cerebral infarction without residual deficits: Secondary | ICD-10-CM

## 2023-08-16 DIAGNOSIS — E1169 Type 2 diabetes mellitus with other specified complication: Secondary | ICD-10-CM | POA: Diagnosis not present

## 2023-08-16 DIAGNOSIS — K2100 Gastro-esophageal reflux disease with esophagitis, without bleeding: Secondary | ICD-10-CM | POA: Diagnosis not present

## 2023-08-16 NOTE — Progress Notes (Unsigned)
New patient visit   Patient: Dustin Ayala   DOB: Dec 13, 1955   68 y.o. Male  MRN: [id number] Visit Date: 08/16/2023  Today's healthcare provider: Alfredia Ferguson, PA-C   No chief complaint on file.  Subjective    Dustin Ayala is a 67 y.o. male who presents today as a new patient to establish care.   Htn-coreg 12.5 bid ? Valsartan 160 mg TIA - plavix 75  Dm - gaba 100 tid ? , oempic 2 mg? 6.8% history of mounjaro -- pt had his face break out Ckd Bph Hld - lipitor 80 mg, zetia 10 Gerd-- omeprazole 40 Phentermine ? 37.5 tpamax 50 - -weight?  Colonoscopy due--check records, needs referral to different office, and record of tetanus  Past Medical History:  Diagnosis Date   Aortic atherosclerosis (HCC)    GERD (gastroesophageal reflux disease)    Hepatic steatosis 11/15/2019   Hepatomegaly 11/15/2019   Hypertension    Microcytic anemia    Morbid obesity (HCC)    Sleep apnea    no cpap   TIA (transient ischemic attack) 2017   Type 2 diabetes mellitus with hyperlipidemia (HCC)    Vitamin D deficiency    Past Surgical History:  Procedure Laterality Date   COLONOSCOPY  2010   no family history   REPLACEMENT TOTAL KNEE Left    BACK IN 2010   TONSILLECTOMY     TOTAL SHOULDER ARTHROPLASTY Left 02/07/2020   Procedure: REVERSE TOTAL SHOULDER ARTHROPLASTY;  Surgeon: Jones Broom, MD;  Location: [facility];  Service: Orthopedics;  Laterality: Left;   TOTAL SHOULDER REPLACEMENT Right    8.21.2018   Family Status  Relation Name Status   Mother  Deceased   Father  Deceased   Sister  Alive   Daughter  Alive   Son  Alive   Neg Hx  (Not Specified)  No partnership data on file   Family History  Problem Relation Age of Onset   COPD Mother    Cancer Mother        basal   Hypertension Mother    Hyperlipidemia Mother    Osteoporosis Mother    Prostate cancer Father    Cancer Father 61       stomach CA   Diabetes Father    Hypertension Father    Hyperlipidemia Father     Colon cancer Father    Heart disease Father    COPD Sister    Crohn's disease Son    Esophageal cancer Neg Hx    Pancreatic cancer Neg Hx    Stomach cancer Neg Hx    Social History   Socioeconomic History   Marital status: Married    Spouse name: Not on file   Number of children: 2   Years of education: Not on file   Highest education level: Not on file  Occupational History   Not on file  Tobacco Use   Smoking status: Never   Smokeless tobacco: Never  Vaping Use   Vaping status: Never Used  Substance and Sexual Activity   Alcohol use: Not Currently   Drug use: Never   Sexual activity: Not Currently  Other Topics Concern   Not on file  Social History Narrative   Not on file   Social Determinants of Health   Financial Resource Strain: Not on file  Food Insecurity: No Food Insecurity (10/07/2021)   Hunger Vital Sign    Worried About Running Out of Food in the Last Year: Never true  Ran Out of Food in the Last Year: Never true  Transportation Needs: No Transportation Needs (10/07/2021)   PRAPARE - Administrator, Civil Service (Medical): No    Lack of Transportation (Non-Medical): No  Physical Activity: Not on file  Stress: Not on file  Social Connections: Unknown (01/26/2022)   Received [facility]   Social Network    Social Network: Not on file   Outpatient Medications Prior to Visit  Medication Sig   atorvastatin (LIPITOR) 80 MG tablet TAKE 1 TABLET BY MOUTH EVERY DAY FOR CHOLESTEROL   betamethasone dipropionate 0.05 % cream Apply topically 2 (two) times daily.   blood glucose meter kit and supplies KIT Dispense based on patient and insurance preference. Use up to four times daily as directed.   carvedilol (COREG) 12.5 MG tablet TAKE 1 TABLET BY MOUTH 2 X /DAY (EVERY 12 HOURS) FOR BP & HEART (Patient taking differently: daily.)   cetirizine (ZYRTEC ALLERGY) 10 MG tablet Take 1 tablet (10 mg total) by mouth daily.    Cholecalciferol 125 MCG (5000 UT) TABS Take 5,000 Units by mouth daily.    clopidogrel (PLAVIX) 75 MG tablet TAKE 1 TABLET BY MOUTH EVERY DAY   Coenzyme Q10 200 MG capsule Take 200 mg by mouth daily before breakfast.   cyclobenzaprine (FLEXERIL) 5 MG tablet Take 1 tablet (5 mg total) by mouth 3 (three) times daily as needed for muscle spasms. (Patient not taking: Reported on 07/28/2023)   ezetimibe (ZETIA) 10 MG tablet TAKE 1 TABLET BY MOUTH EVERY DAY FOR CHOLESTEROL   Ferrous Sulfate (IRON) 325 (65 Fe) MG TABS Take by mouth.   furosemide (LASIX) 40 MG tablet Take 1 tablet daily for BP & Fluid Retention / Ankle Swelling (Patient not taking: Reported on 03/28/2023)   gabapentin (NEURONTIN) 100 MG capsule TAKE 1 CAPSULE (100 MG TOTAL) BY MOUTH THREE TIMES DAILY.   glucose blood (ONETOUCH VERIO) test strip Check blood sugar 1 time a day-E11.22.   Lancets (ONETOUCH ULTRASOFT) lancets Use as instructed   Multiple Vitamin (MULTIVITAMIN) tablet Take 1 tablet by mouth daily.   omeprazole (PRILOSEC) 40 MG capsule TAKE 1 CAPSULE DAILY TO PREVENT HEART BURN & INDIGESTION   OZEMPIC, 2 MG/DOSE, 8 MG/3ML SOPN INJECT TWO MG INTO THE SKIN ONCE A WEEK   phentermine (ADIPEX-P) 37.5 MG tablet Take 1 tablet (37.5 mg total) by mouth daily before breakfast.   topiramate (TOPAMAX) 50 MG tablet TAKE 1 TABLET BY MOUTH TWICE A DAY   valsartan (DIOVAN) 160 MG tablet TAKE 1 TABLET DAILY FOR BP & DIABETIC KIDNEY PROTECTION   No facility-administered medications prior to visit.   Allergies  Allergen Reactions   Sulfamethoxazole-Trimethoprim Rash    Immunization History  Administered Date(s) Administered   PFIZER(Purple Top)SARS-COV-2 Vaccination 12/01/2019, 12/22/2019, 07/21/2020    Health Maintenance  Topic Date Due   Hepatitis C Screening  Never done   DTaP/Tdap/Td (1 - Tdap) Never done   Zoster Vaccines- Shingrix (1 of 2) Never done   Pneumonia Vaccine 30+ Years old (1 of 1 - PCV) Never done   OPHTHALMOLOGY  EXAM  03/17/2022   Colonoscopy  01/18/2023   FOOT EXAM  03/25/2023   INFLUENZA VACCINE  Never done   COVID-19 Vaccine (4 - 2023-24 season) 05/15/2023   HEMOGLOBIN A1C  01/25/2024   Diabetic kidney evaluation - Urine ACR  03/27/2024   Diabetic kidney evaluation - eGFR measurement  07/27/2024   Medicare Annual Wellness (AWV)  07/27/2024  HPV VACCINES  Aged Out    Patient Care Team: Lucky Cowboy, MD as PCP - General (Internal Medicine) Charlett Nose, [facility] (Inactive) as Pharmacist (Pharmacist)  Review of Systems  {Insert previous labs (optional):23779} {See past labs  Heme  Chem  Endocrine  Serology  Results Review (optional):1}   Objective    There were no vitals taken for this visit. {Insert last BP/Wt (optional):23777}{See vitals history (optional):1}   Physical Exam Constitutional:      General: He is awake.     Appearance: He is well-developed.  HENT:     Head: Normocephalic.  Eyes:     Conjunctiva/sclera: Conjunctivae normal.  Cardiovascular:     Rate and Rhythm: Normal rate and regular rhythm.     Heart sounds: Normal heart sounds.  Pulmonary:     Effort: Pulmonary effort is normal.     Breath sounds: Normal breath sounds.  Skin:    General: Skin is warm.  Neurological:     Mental Status: He is alert and oriented to person, place, and time.  Psychiatric:        Attention and Perception: Attention normal.        Mood and Affect: Mood normal.        Speech: Speech normal.        Behavior: Behavior is cooperative.    ***  Depression Screen    07/28/2023    9:23 AM 05/10/2022    2:26 PM  PHQ 2/9 Scores  PHQ - 2 Score 0 0   No results found for any visits on 08/16/23.  Assessment & Plan     There are no diagnoses linked to this encounter.   No follow-ups on file.      Alfredia Ferguson, PA-C  [facility] [phone] (phone) [phone] (fax)  [facility]

## 2023-08-17 ENCOUNTER — Encounter: Payer: Self-pay | Admitting: Physician Assistant

## 2023-08-17 DIAGNOSIS — E1143 Type 2 diabetes mellitus with diabetic autonomic (poly)neuropathy: Secondary | ICD-10-CM | POA: Insufficient documentation

## 2023-08-17 NOTE — Assessment & Plan Note (Signed)
Chronic, manages with omeprazole 40 mg

## 2023-08-17 NOTE — Assessment & Plan Note (Signed)
Last A1c 6.8%, slowly increasing from last 1-2 years of 6.2%-6.5%. Pt manages with ozempic 2 mg. He reports a change in diet and being aware he needs to avoid certain foods.  On statin, arb Uacr utd.  Pt has history of podiatry visits, will do foot exam at f/u

## 2023-08-17 NOTE — Assessment & Plan Note (Signed)
Manages with gabapentin 100 mg tid prn

## 2023-08-17 NOTE — Assessment & Plan Note (Signed)
Pt did not achieve enough weight loss w/ ozempic. Trial of mounjaro resulted in a rash, d/c.  Now on phentermine 37.5 mg and topamax 50 mg.

## 2023-08-17 NOTE — Assessment & Plan Note (Signed)
TIA 01/2018 On plavix

## 2023-08-17 NOTE — Assessment & Plan Note (Signed)
Chronic, well controlled Manages with coreg 12.5 mg and valsartan 160 mg  Reviewed cmp F/u 6 mo

## 2023-08-17 NOTE — Assessment & Plan Note (Signed)
Pt takes lipitor 80 mg, plavix 75.

## 2023-08-31 ENCOUNTER — Ambulatory Visit
Admission: EM | Admit: 2023-08-31 | Discharge: 2023-08-31 | Disposition: A | Discharge status: Home / Self Care | Payer: PPO | Attending: Internal Medicine | Admitting: Internal Medicine

## 2023-08-31 DIAGNOSIS — K529 Noninfective gastroenteritis and colitis, unspecified: Secondary | ICD-10-CM | POA: Diagnosis not present

## 2023-08-31 NOTE — ED Provider Notes (Signed)
[facility]    CSN: [id number] Arrival date & time: 08/31/23  1410      History   Chief Complaint Chief Complaint  Patient presents with   Emesis   Diarrhea    HPI Dustin Ayala is a 67 y.o. male presents for nausea/vomiting.  Patient reports yesterday morning he awoke with some diarrhea and had 1-2 episodes of nonbilious nonbloody vomit.  States after he vomited last night he began to feel better.  Reports has had no additional episode of vomiting and his diarrhea is improving.  No blood in the stool.  No fevers, URI symptoms.  No history of GI diagnoses such as Crohn's, IBS, colitis.  No contact with sick individuals.  He is taking fluids normally with normal urination.  Was able to keep down soup and a half of a sandwich without issue.  No other concerns at this time   Emesis Associated symptoms: diarrhea   Diarrhea Associated symptoms: vomiting     Past Medical History:  Diagnosis Date   Aortic atherosclerosis (HCC)    Arthritis    GERD (gastroesophageal reflux disease)    Hepatic steatosis 11/15/2019   Hepatomegaly 11/15/2019   Hypertension    Microcytic anemia    Morbid obesity (HCC)    Sleep apnea    no cpap   TIA (transient ischemic attack) 2017   Type 2 diabetes mellitus with hyperlipidemia (HCC)    Vitamin D deficiency     Patient Active Problem List   Diagnosis Date Noted   Peripheral autonomic neuropathy due to DM (HCC) 08/17/2023   Benign prostatic hyperplasia with lower urinary tract symptoms 07/28/2023   Thrombophilia (HCC) 01/02/2020   History of TIA (transient ischemic attack) 12/07/2019   Aortic atherosclerosis (HCC) 11/26/2019   Hepatic steatosis 11/26/2019   Hepatomegaly 11/26/2019   Microcytic anemia 11/15/2019   Venous insufficiency 09/27/2019   Morbid obesity (HCC)    Type 2 diabetes mellitus with hyperlipidemia (HCC)    Vitamin D deficiency    GERD (gastroesophageal reflux disease)    OSA (obstructive sleep apnea)     Hypertension associated with diabetes (HCC)     Past Surgical History:  Procedure Laterality Date   COLONOSCOPY  2010   no family history   JOINT REPLACEMENT     Knee and both shoulders   REPLACEMENT TOTAL KNEE Left    BACK IN 2010   TONSILLECTOMY     TOTAL SHOULDER ARTHROPLASTY Left 02/07/2020   Procedure: REVERSE TOTAL SHOULDER ARTHROPLASTY;  Surgeon: Jones Broom, MD;  Location: [facility];  Service: Orthopedics;  Laterality: Left;   TOTAL SHOULDER REPLACEMENT Right    8.21.2018       Home Medications    Prior to Admission medications   Medication Sig Start Date End Date Taking? Authorizing Provider  atorvastatin (LIPITOR) 80 MG tablet TAKE 1 TABLET BY MOUTH EVERY DAY FOR CHOLESTEROL 06/08/23   Raynelle Dick, NP  betamethasone dipropionate 0.05 % cream Apply topically 2 (two) times daily. 07/28/23   Adela Glimpse, NP  blood glucose meter kit and supplies KIT Dispense based on patient and insurance preference. Use up to four times daily as directed. 03/24/22   Raynelle Dick, NP  carvedilol (COREG) 12.5 MG tablet Take 12.5 mg by mouth once.    [provider]  cetirizine (ZYRTEC ALLERGY) 10 MG tablet Take 1 tablet (10 mg total) by mouth daily. 12/26/22   Wallis Bamberg, PA-C  Cholecalciferol 125 MCG (5000 UT) TABS Take 5,000 Units  by mouth daily.     [provider]  clopidogrel (PLAVIX) 75 MG tablet TAKE 1 TABLET BY MOUTH EVERY DAY 04/26/23   Cranford, Archie Patten, NP  Coenzyme Q10 200 MG capsule Take 200 mg by mouth daily before breakfast.    [provider]  ezetimibe (ZETIA) 10 MG tablet TAKE 1 TABLET BY MOUTH EVERY DAY FOR CHOLESTEROL 06/07/23   Raynelle Dick, NP  Ferrous Sulfate (IRON) 325 (65 Fe) MG TABS Take by mouth.    [provider]  gabapentin (NEURONTIN) 100 MG capsule TAKE 1 CAPSULE (100 MG TOTAL) BY MOUTH THREE TIMES DAILY. 10/12/22   Adela Glimpse, NP  glucose blood (ONETOUCH VERIO) test strip Check blood sugar 1 time a  day-E11.22. 04/14/22   Raynelle Dick, NP  Lancets [facility] ULTRASOFT) lancets Use as instructed 04/14/22   Raynelle Dick, NP  Multiple Vitamin (MULTIVITAMIN) tablet Take 1 tablet by mouth daily.    [provider]  omeprazole (PRILOSEC) 40 MG capsule TAKE 1 CAPSULE DAILY TO PREVENT HEART BURN & INDIGESTION 06/07/23   Raynelle Dick, NP  OZEMPIC, 2 MG/DOSE, 8 MG/3ML SOPN INJECT TWO MG INTO THE SKIN ONCE A WEEK 07/28/23   Adela Glimpse, NP  phentermine (ADIPEX-P) 37.5 MG tablet Take 1 tablet (37.5 mg total) by mouth daily before breakfast. 07/28/23   Adela Glimpse, NP  topiramate (TOPAMAX) 50 MG tablet TAKE 1 TABLET BY MOUTH TWICE A DAY 06/14/23   Raynelle Dick, NP  valsartan (DIOVAN) 160 MG tablet TAKE 1 TABLET DAILY FOR BP & DIABETIC KIDNEY PROTECTION 12/24/22   Raynelle Dick, NP    Family History Family History  Problem Relation Age of Onset   COPD Mother    Cancer Mother        basal   Hypertension Mother    Hyperlipidemia Mother    Osteoporosis Mother    Arthritis Mother    Prostate cancer Father    Cancer Father 66       stomach CA   Diabetes Father    Hypertension Father    Hyperlipidemia Father    Colon cancer Father    Heart disease Father    Obesity Father    COPD Sister    Depression Sister    Crohn's disease Son    Esophageal cancer Neg Hx    Pancreatic cancer Neg Hx    Stomach cancer Neg Hx     Social History Social History   Tobacco Use   Smoking status: Never   Smokeless tobacco: Never  Vaping Use   Vaping status: Never Used  Substance Use Topics   Alcohol use: Not Currently   Drug use: Never     Allergies   Mounjaro [tirzepatide] and Sulfamethoxazole-trimethoprim   Review of Systems Review of Systems  Gastrointestinal:  Positive for diarrhea, nausea and vomiting.     Physical Exam Triage Vital Signs ED Triage Vitals  Encounter Vitals Group     BP 08/31/23 1508 99/62     Systolic BP Percentile --      Diastolic  BP Percentile --      Pulse Rate 08/31/23 1508 (!) 58     Resp 08/31/23 1508 18     Temp 08/31/23 1508 98 F (36.7 C)     Temp Source 08/31/23 1508 Oral     SpO2 08/31/23 1508 94 %     Weight --      Height --      Head Circumference --  Peak Flow --      Pain Score 08/31/23 1507 3     Pain Loc --      Pain Education --      Exclude from Growth Chart --    No data found.  Updated Vital Signs BP 99/62 (BP Location: Right Arm)   Pulse (!) 58   Temp 98 F (36.7 C) (Oral)   Resp 18   SpO2 94%   Visual Acuity Right Eye Distance:   Left Eye Distance:   Bilateral Distance:    Right Eye Near:   Left Eye Near:    Bilateral Near:     Physical Exam Vitals and nursing note reviewed.  Constitutional:      General: He is not in acute distress.    Appearance: Normal appearance. He is obese. He is not ill-appearing.  HENT:     Head: Normocephalic and atraumatic.  Eyes:     Pupils: Pupils are equal, round, and reactive to light.  Cardiovascular:     Rate and Rhythm: Normal rate.  Pulmonary:     Effort: Pulmonary effort is normal.  Abdominal:     General: Bowel sounds are normal.     Palpations: There is no hepatomegaly or splenomegaly.     Tenderness: There is no abdominal tenderness. Negative signs include McBurney's sign.     Comments: Abdomen is obese somewhat limiting exam.  Skin:    General: Skin is warm and dry.  Neurological:     General: No focal deficit present.     Mental Status: He is alert and oriented to person, place, and time.  Psychiatric:        Mood and Affect: Mood normal.        Behavior: Behavior normal.      UC Treatments / Results  Labs (all labs ordered are listed, but only abnormal results are displayed) Labs Reviewed - No data to display  EKG   Radiology No results found.  Procedures Procedures (including critical care time)  Medications Ordered in UC Medications - No data to display  Initial Impression / Assessment and  Plan / UC Course  I have reviewed the triage vital signs and the nursing notes.  Pertinent labs & imaging results that were available during my care of the patient were reviewed by me and considered in my medical decision making (see chart for details).     Reviewed exam and symptoms with patient.  No red flags.  Patient was gastroenteritis symptoms that are improving.  No fevers and is well-appearing on exam.  Advised to continue symptomatic treatment, encourage fluids/electrolyte replacement and bland/brat diet.  PCP follow-up symptoms do not improve.  ER precautions reviewed. Final Clinical Impressions(s) / UC Diagnoses   Final diagnoses:  Gastroenteritis     Discharge Instructions      Continue hydration with Gatorade, Powerade, Pedialyte, water.  Bland diet and advance as you tolerate.  Please follow-up with your PCP if your symptoms do not continue to improve.  Please go to the ER if you develop any worsening symptoms.  I hope you feel better soon!    ED Prescriptions   None    PDMP not reviewed this encounter.   Radford Pax, NP 08/31/23 1525

## 2023-08-31 NOTE — ED Triage Notes (Addendum)
Pt presents with c/o diarrhea since Tuesday morning. Pt states he felt worse through out the day. Pt reports vomiting Tuesday night and feeling fatigued

## 2023-08-31 NOTE — Discharge Instructions (Signed)
Continue hydration with Gatorade, Powerade, Pedialyte, water.  Bland diet and advance as you tolerate.  Please follow-up with your PCP if your symptoms do not continue to improve.  Please go to the ER if you develop any worsening symptoms.  I hope you feel better soon!

## 2023-09-02 ENCOUNTER — Other Ambulatory Visit: Payer: Self-pay | Admitting: Nurse Practitioner

## 2023-09-03 ENCOUNTER — Other Ambulatory Visit: Payer: Self-pay | Admitting: Nurse Practitioner

## 2023-09-03 DIAGNOSIS — E1169 Type 2 diabetes mellitus with other specified complication: Secondary | ICD-10-CM

## 2023-09-03 DIAGNOSIS — Z79899 Other long term (current) drug therapy: Secondary | ICD-10-CM

## 2023-09-21 ENCOUNTER — Ambulatory Visit: Payer: PPO | Admitting: Physician Assistant

## 2023-09-21 VITALS — BP 103/69 | HR 60 | Temp 97.8°F | Ht 70.5 in | Wt 299.0 lb

## 2023-09-21 DIAGNOSIS — J0100 Acute maxillary sinusitis, unspecified: Secondary | ICD-10-CM

## 2023-09-21 MED ORDER — AMOXICILLIN 875 MG PO TABS: 875.0000 mg | ORAL_TABLET | Freq: Two times a day (BID) | ORAL | 0 refills | Status: AC

## 2023-09-21 NOTE — Progress Notes (Signed)
 Established patient visit   Patient: Dustin Ayala   DOB: 14-Jan-1956   68 y.o. Male  MRN: [id number] Visit Date: 09/21/2023  Today's healthcare provider: Manuelita Flatness, PA-C   Chief Complaint  Patient presents with   Ear Pain    Pain in both ears- present for about a week now States it sounds like it has fluid in it     Sinusitis    States he has been taking sudafed for head congestion and nothing has helped   Subjective     Pt reports sinus congestion, pressure, hearing loss and pressure in both ears. Reports his left ear has improved and he can hear better, but still feels significant hearing loss in the right.  Symptoms ongoing x 2 weeks.   Medications: Outpatient Medications Prior to Visit  Medication Sig   atorvastatin  (LIPITOR) 80 MG tablet TAKE 1 TABLET BY MOUTH EVERY DAY FOR CHOLESTEROL   betamethasone  dipropionate 0.05 % cream Apply topically 2 (two) times daily.   blood glucose meter kit and supplies KIT Dispense based on patient and insurance preference. Use up to four times daily as directed.   carvedilol  (COREG ) 12.5 MG tablet Take 12.5 mg by mouth once.   cetirizine  (ZYRTEC  ALLERGY) 10 MG tablet Take 1 tablet (10 mg total) by mouth daily.   Cholecalciferol 125 MCG (5000 UT) TABS Take 5,000 Units by mouth daily.    clopidogrel  (PLAVIX ) 75 MG tablet TAKE 1 TABLET BY MOUTH EVERY DAY   Coenzyme Q10 200 MG capsule Take 200 mg by mouth daily before breakfast.   ezetimibe  (ZETIA ) 10 MG tablet TAKE 1 TABLET BY MOUTH EVERY DAY FOR CHOLESTEROL   Ferrous Sulfate (IRON) 325 (65 Fe) MG TABS Take by mouth.   gabapentin  (NEURONTIN ) 100 MG capsule TAKE 1 CAPSULE (100 MG TOTAL) BY MOUTH THREE TIMES DAILY.   glucose blood (ONETOUCH VERIO) test strip Check blood sugar 1 time a day-E11.22.   Lancets (ONETOUCH ULTRASOFT) lancets Use as instructed   Multiple Vitamin (MULTIVITAMIN) tablet Take 1 tablet by mouth daily.   omeprazole  (PRILOSEC) 40 MG capsule TAKE 1 CAPSULE DAILY TO  PREVENT HEART BURN & INDIGESTION   OZEMPIC , 2 MG/DOSE, 8 MG/3ML SOPN INJECT TWO MG INTO THE SKIN ONCE A WEEK   phentermine  (ADIPEX-P ) 37.5 MG tablet Take 1 tablet (37.5 mg total) by mouth daily before breakfast.   topiramate  (TOPAMAX ) 50 MG tablet TAKE 1 TABLET BY MOUTH TWICE A DAY   valsartan  (DIOVAN ) 160 MG tablet TAKE 1 TABLET DAILY FOR BP & DIABETIC KIDNEY PROTECTION   No facility-administered medications prior to visit.    Review of Systems  Constitutional:  Negative for fatigue and fever.  HENT:  Positive for congestion, sinus pressure and sinus pain.        Hearing loss  Respiratory:  Negative for cough and shortness of breath.   Cardiovascular:  Negative for chest pain, palpitations and leg swelling.  Neurological:  Negative for dizziness and headaches.       Objective    BP 103/69   Pulse 60   Temp 97.8 F (36.6 C) (Oral)   Ht 5' 10.5 (1.791 m)   Wt 299 lb (135.6 kg)   SpO2 94%   BMI 42.30 kg/m    Physical Exam Vitals reviewed.  Constitutional:      Appearance: He is not ill-appearing.  HENT:     Head: Normocephalic.     Right Ear: Tympanic membrane normal.     Left  Ear: Tympanic membrane normal.     Ears:     Comments: No impacted cerumen bilaterally. L TM injected, but no bulging, erythema, with no perforations. I can only partially see R TM, also appears injected, but no overall erythema, suppurative fluid, bulging. Eyes:     Conjunctiva/sclera: Conjunctivae normal.  Cardiovascular:     Rate and Rhythm: Normal rate.  Pulmonary:     Effort: Pulmonary effort is normal. No respiratory distress.  Neurological:     General: No focal deficit present.     Mental Status: He is alert and oriented to person, place, and time.  Psychiatric:        Mood and Affect: Mood normal.        Behavior: Behavior normal.     No results found for any visits on 09/21/23.  Assessment & Plan    Acute non-recurrent maxillary sinusitis -     Amoxicillin ; Take 1 tablet (875  mg total) by mouth 2 (two) times daily for 7 days.  Dispense: 14 tablet; Refill: 0  No overt ear infections or perforations Will treat as sinusitis Rx amox bid x 7 days  Recommending saline nasal rinses over the counter, cont his antihistamine otc.  If hearing does not return on right side after abx therapy, f/b with office.   Return if symptoms worsen or fail to improve.       Manuelita Flatness, PA-C  [facility] [phone] (phone) [phone] (fax)  [facility]

## 2023-10-31 ENCOUNTER — Ambulatory Visit: Payer: PPO | Admitting: Internal Medicine

## 2023-11-09 ENCOUNTER — Other Ambulatory Visit: Payer: Self-pay

## 2023-11-09 DIAGNOSIS — E1142 Type 2 diabetes mellitus with diabetic polyneuropathy: Secondary | ICD-10-CM

## 2023-11-09 DIAGNOSIS — Z79899 Other long term (current) drug therapy: Secondary | ICD-10-CM

## 2023-11-09 MED ORDER — ONETOUCH VERIO VI STRP: ORAL_STRIP | 12 refills | Status: AC

## 2023-11-09 MED ORDER — BETAMETHASONE DIPROPIONATE 0.05 % EX CREA: TOPICAL_CREAM | Freq: Two times a day (BID) | CUTANEOUS | 0 refills | Status: AC

## 2023-11-11 ENCOUNTER — Other Ambulatory Visit: Payer: Self-pay | Admitting: Physician Assistant

## 2023-11-11 ENCOUNTER — Other Ambulatory Visit: Payer: Self-pay

## 2023-11-11 DIAGNOSIS — Z79899 Other long term (current) drug therapy: Secondary | ICD-10-CM

## 2023-11-11 DIAGNOSIS — K760 Fatty (change of) liver, not elsewhere classified: Secondary | ICD-10-CM

## 2023-11-11 MED ORDER — ONETOUCH DELICA LANCETS 33G MISC: 12 refills | Status: AC

## 2023-11-25 ENCOUNTER — Telehealth: Payer: Self-pay

## 2023-11-25 NOTE — Telephone Encounter (Signed)
 Copied from CRM [id number]. Topic: Clinical - Prescription Issue >> Nov 25, 2023 12:00 PM Isabell A wrote: Reason for CRM: Lokesh from [employer] calling in regard to the prior authorization for Ozempic - requesting clinical notes & diagnosis code, also wants to confirm if member is responding positive on the therapy.  Callback number: [phone] opt 2.

## 2023-11-30 ENCOUNTER — Other Ambulatory Visit: Payer: Self-pay | Admitting: Physician Assistant

## 2023-11-30 DIAGNOSIS — E1142 Type 2 diabetes mellitus with diabetic polyneuropathy: Secondary | ICD-10-CM

## 2023-11-30 MED ORDER — OZEMPIC (2 MG/DOSE) 8 MG/3ML ~~LOC~~ SOPN
PEN_INJECTOR | SUBCUTANEOUS | 1 refills | Status: DC
Start: 2023-11-30 — End: 2024-02-13

## 2023-12-01 ENCOUNTER — Other Ambulatory Visit (HOSPITAL_COMMUNITY): Payer: Self-pay

## 2023-12-01 ENCOUNTER — Other Ambulatory Visit: Payer: Self-pay | Admitting: Physician Assistant

## 2023-12-01 ENCOUNTER — Telehealth: Payer: Self-pay

## 2023-12-01 DIAGNOSIS — I10 Essential (primary) hypertension: Secondary | ICD-10-CM

## 2023-12-01 DIAGNOSIS — Z79899 Other long term (current) drug therapy: Secondary | ICD-10-CM

## 2023-12-01 NOTE — Telephone Encounter (Signed)
 Pharmacy Patient Advocate Encounter   Received notification from Pt Calls Messages that prior authorization for Ozempic 0.25 is required/requested.   Insurance verification completed.   The patient is insured through [facility] ADVANTAGE/RX ADVANCE .   Per test claim: Refill too soon. PA is not needed at this time. Medication was filled 11/17/23. Next eligible fill date is 12/08/23. Will need PA then.

## 2023-12-08 ENCOUNTER — Other Ambulatory Visit: Payer: Self-pay | Admitting: Physician Assistant

## 2023-12-08 DIAGNOSIS — E1169 Type 2 diabetes mellitus with other specified complication: Secondary | ICD-10-CM

## 2023-12-08 DIAGNOSIS — E782 Mixed hyperlipidemia: Secondary | ICD-10-CM

## 2023-12-08 DIAGNOSIS — Z79899 Other long term (current) drug therapy: Secondary | ICD-10-CM

## 2023-12-13 ENCOUNTER — Telehealth: Payer: Self-pay | Admitting: Neurology

## 2023-12-13 NOTE — Telephone Encounter (Signed)
 Copied from CRM [id number]. Topic: Clinical - Prescription Issue >> Dec 13, 2023  3:09 PM Lovey Newcomer R wrote: Reason for CRM: Regarding OZEMPIC, 2 MG/DOSE, 8 MG/3ML SOPN, it is now past the fill date of 12/08/23. PA is still required and needed for the pharmacy to dispense the medication. Please resubmit and follow up with patient once this process is started and/or completed. Thank you

## 2023-12-14 ENCOUNTER — Other Ambulatory Visit (HOSPITAL_COMMUNITY): Payer: Self-pay

## 2023-12-15 ENCOUNTER — Other Ambulatory Visit (HOSPITAL_COMMUNITY): Payer: Self-pay

## 2023-12-20 ENCOUNTER — Telehealth: Payer: Self-pay

## 2023-12-20 NOTE — Telephone Encounter (Signed)
 Copied from CRM [id number]. Topic: Referral - Prior Authorization Question >> Dec 20, 2023 10:14 AM Pascal Lux wrote: Reason for CRM: Patient stated that he is going on 2 weeks without his Ozempic shot. Stated he called his insurance and they advised they have a verification code that they will only give to the provider office before [facility] can fill the prescription. Call back number: [phone] opt 2.

## 2023-12-21 ENCOUNTER — Telehealth: Payer: Self-pay | Admitting: Pharmacy Technician

## 2023-12-21 ENCOUNTER — Other Ambulatory Visit (HOSPITAL_COMMUNITY): Payer: Self-pay

## 2023-12-21 NOTE — Telephone Encounter (Signed)
 Pharmacy Patient Advocate Encounter   Received notification from Pt Calls Messages that prior authorization for OZEMPIC 2MG  is required/requested.   Insurance verification completed.   The patient is insured through [facility] ADVANTAGE/RX ADVANCE .   Per test claim: PA required; PA submitted to above mentioned insurance via Phone Key/confirmation #/EOC [id number] Status is pending

## 2023-12-21 NOTE — Telephone Encounter (Signed)
 PA request has been Started. New Encounter has been or will be created for follow up. For additional info see Pharmacy Prior Auth telephone encounter from 12/21/23.

## 2023-12-22 ENCOUNTER — Other Ambulatory Visit (HOSPITAL_COMMUNITY): Payer: Self-pay

## 2023-12-22 ENCOUNTER — Other Ambulatory Visit: Payer: Self-pay | Admitting: Physician Assistant

## 2023-12-22 DIAGNOSIS — Z6841 Body Mass Index (BMI) 40.0 and over, adult: Secondary | ICD-10-CM

## 2023-12-22 DIAGNOSIS — K760 Fatty (change of) liver, not elsewhere classified: Secondary | ICD-10-CM

## 2023-12-22 DIAGNOSIS — Z79899 Other long term (current) drug therapy: Secondary | ICD-10-CM

## 2023-12-22 NOTE — Telephone Encounter (Signed)
 Patient is needing a call back regarding his medication ozempic he would like a call back today regarding  this he has been working on this for two weeks now

## 2023-12-22 NOTE — Telephone Encounter (Signed)
 PA is pending since 12/21/23, awaiting determination.

## 2023-12-23 ENCOUNTER — Other Ambulatory Visit (HOSPITAL_COMMUNITY): Payer: Self-pay

## 2023-12-23 NOTE — Telephone Encounter (Signed)
 Pharmacy Patient Advocate Encounter  Received notification from [facility] ADVANTAGE/RX ADVANCE that Prior Authorization for [facility] 2MG  has been APPROVED as of 12/22/23, approval letter will be uploaded to media task once available. Ran test claim and pt's copay is $0.00      PA #/Case ID/Reference #: [id number]

## 2023-12-23 NOTE — Telephone Encounter (Signed)
 Requesting: phentermine  Contract: n/a UDS: n/a Last Visit: 08/16/23 Next Visit: 02/14/24 Last Refill: 11/11/23 #30 and 0RF   Please Advise

## 2024-02-03 DIAGNOSIS — K648 Other hemorrhoids: Secondary | ICD-10-CM | POA: Diagnosis not present

## 2024-02-03 DIAGNOSIS — Z1211 Encounter for screening for malignant neoplasm of colon: Secondary | ICD-10-CM | POA: Diagnosis not present

## 2024-02-03 DIAGNOSIS — K5730 Diverticulosis of large intestine without perforation or abscess without bleeding: Secondary | ICD-10-CM | POA: Diagnosis not present

## 2024-02-03 DIAGNOSIS — Z860100 Personal history of colon polyps, unspecified: Secondary | ICD-10-CM | POA: Diagnosis not present

## 2024-02-03 LAB — HM COLONOSCOPY

## 2024-02-06 ENCOUNTER — Emergency Department (HOSPITAL_BASED_OUTPATIENT_CLINIC_OR_DEPARTMENT_OTHER)
Admission: EM | Admit: 2024-02-06 | Discharge: 2024-02-06 | Disposition: A | Discharge status: Home / Self Care | Attending: Emergency Medicine | Admitting: Emergency Medicine

## 2024-02-06 ENCOUNTER — Encounter (HOSPITAL_BASED_OUTPATIENT_CLINIC_OR_DEPARTMENT_OTHER): Payer: Self-pay

## 2024-02-06 ENCOUNTER — Other Ambulatory Visit: Payer: Self-pay

## 2024-02-06 DIAGNOSIS — K625 Hemorrhage of anus and rectum: Secondary | ICD-10-CM | POA: Insufficient documentation

## 2024-02-06 DIAGNOSIS — E119 Type 2 diabetes mellitus without complications: Secondary | ICD-10-CM | POA: Insufficient documentation

## 2024-02-06 DIAGNOSIS — Z79899 Other long term (current) drug therapy: Secondary | ICD-10-CM | POA: Diagnosis not present

## 2024-02-06 DIAGNOSIS — I10 Essential (primary) hypertension: Secondary | ICD-10-CM | POA: Diagnosis not present

## 2024-02-06 DIAGNOSIS — Z7902 Long term (current) use of antithrombotics/antiplatelets: Secondary | ICD-10-CM | POA: Insufficient documentation

## 2024-02-06 DIAGNOSIS — Z8673 Personal history of transient ischemic attack (TIA), and cerebral infarction without residual deficits: Secondary | ICD-10-CM | POA: Insufficient documentation

## 2024-02-06 LAB — LIPASE, BLOOD: Lipase: 56 U/L — ABNORMAL HIGH (ref 11–51)

## 2024-02-06 LAB — CBC WITH DIFFERENTIAL/PLATELET
Abs Immature Granulocytes: 0.04 10*3/uL (ref 0.00–0.07)
Basophils Absolute: 0.1 10*3/uL (ref 0.0–0.1)
Basophils Relative: 1 %
Eosinophils Absolute: 0.4 10*3/uL (ref 0.0–0.5)
Eosinophils Relative: 3 %
HCT: 44.8 % (ref 39.0–52.0)
Hemoglobin: 14.5 g/dL (ref 13.0–17.0)
Immature Granulocytes: 0 %
Lymphocytes Relative: 20 %
Lymphs Abs: 2.2 10*3/uL (ref 0.7–4.0)
MCH: 27.2 pg (ref 26.0–34.0)
MCHC: 32.4 g/dL (ref 30.0–36.0)
MCV: 83.9 fL (ref 80.0–100.0)
Monocytes Absolute: 0.9 10*3/uL (ref 0.1–1.0)
Monocytes Relative: 9 %
Neutro Abs: 7.3 10*3/uL (ref 1.7–7.7)
Neutrophils Relative %: 67 %
Platelets: 284 10*3/uL (ref 150–400)
RBC: 5.34 MIL/uL (ref 4.22–5.81)
RDW: 13.9 % (ref 11.5–15.5)
WBC: 10.9 10*3/uL — ABNORMAL HIGH (ref 4.0–10.5)
nRBC: 0 % (ref 0.0–0.2)

## 2024-02-06 LAB — COMPREHENSIVE METABOLIC PANEL WITH GFR
ALT: 42 U/L (ref 0–44)
AST: 35 U/L (ref 15–41)
Albumin: 4.4 g/dL (ref 3.5–5.0)
Alkaline Phosphatase: 98 U/L (ref 38–126)
Anion gap: 13 (ref 5–15)
BUN: 23 mg/dL (ref 8–23)
CO2: 21 mmol/L — ABNORMAL LOW (ref 22–32)
Calcium: 9.4 mg/dL (ref 8.9–10.3)
Chloride: 109 mmol/L (ref 98–111)
Creatinine, Ser: 1.21 mg/dL (ref 0.61–1.24)
GFR, Estimated: >60 mL/min (ref >60)
Glucose, Bld: 99 mg/dL (ref 70–99)
Potassium: 4.6 mmol/L (ref 3.5–5.1)
Sodium: 143 mmol/L (ref 135–145)
Total Bilirubin: 0.3 mg/dL (ref 0.0–1.2)
Total Protein: 7.4 g/dL (ref 6.5–8.1)

## 2024-02-06 LAB — OCCULT BLOOD X 1 CARD TO LAB, STOOL: Fecal Occult Bld: POSITIVE — AB

## 2024-02-06 NOTE — ED Provider Notes (Signed)
 [place] [facility] AT [facility] Provider Note   CSN: [id number] Arrival date & time: 02/06/24  1649     History  Chief Complaint  Patient presents with   Rectal Bleeding    Dustin Ayala is a 68 y.o. male.  Patient to ED with complaint of dark "black" stool developing over the last 2 days. He had a colonoscopy 02/03/24 [facility]) and reports polyps were removed. He has had multiple soft stools since and noted they were becoming darker and darker, today appearing black. He had had some abdominal cramping, mostly prior to a bowel movement. No pain currently. No nausea or vomiting. No fever. He does reports significant belching and passing gas. No lightheadedness, dizziness or syncope. He is prescribed Plavix  but was stopped for the procedure and has not been restarted yet.   The history is provided by the patient and the spouse. No language interpreter was used.  Rectal Bleeding      Home Medications Prior to Admission medications   Medication Sig Start Date End Date Taking? Authorizing Provider  atorvastatin  (LIPITOR) 80 MG tablet Take 1 tablet (80 mg total) by mouth daily. 12/08/23   Trenton Frock, PA-C  betamethasone  dipropionate 0.05 % cream Apply topically 2 (two) times daily. 11/09/23   Rondall Codding, FNP  blood glucose meter kit and supplies KIT Dispense based on patient and insurance preference. Use up to four times daily as directed. 03/24/22   Wilkinson, Dana E, FNP  carvedilol  (COREG ) 12.5 MG tablet Take 12.5 mg by mouth once.    [provider]  cetirizine  (ZYRTEC  ALLERGY) 10 MG tablet Take 1 tablet (10 mg total) by mouth daily. 12/26/22   Adolph Hoop, PA-C  Cholecalciferol 125 MCG (5000 UT) TABS Take 5,000 Units by mouth daily.     [provider]  clopidogrel  (PLAVIX ) 75 MG tablet TAKE 1 TABLET BY MOUTH EVERY DAY 09/02/23   Wilkinson, Dana E, FNP  Coenzyme Q10 200 MG capsule Take 200 mg by mouth daily before breakfast.     [provider]  ezetimibe  (ZETIA ) 10 MG tablet Take 1 tablet (10 mg total) by mouth daily. 12/08/23   Trenton Frock, PA-C  Ferrous Sulfate (IRON) 325 (65 Fe) MG TABS Take by mouth.    [provider]  gabapentin  (NEURONTIN ) 100 MG capsule TAKE 1 CAPSULE (100 MG TOTAL) BY MOUTH THREE TIMES DAILY. 10/12/22   Langley Pippin, NP  glucose blood (ONETOUCH VERIO) test strip Check blood sugar 1 time a day-E11.22. 11/09/23   Webb, Padonda B, FNP  Lancets [facility] ULTRASOFT) lancets Use as instructed 04/14/22   Wilkinson, Dana E, FNP  Multiple Vitamin (MULTIVITAMIN) tablet Take 1 tablet by mouth daily.    [provider]  omeprazole  (PRILOSEC) 40 MG capsule TAKE 1 CAPSULE DAILY TO PREVENT HEART BURN & INDIGESTION 06/07/23   Wilkinson, Dana E, FNP  OneTouch Delica Lancets 33G MISC Check blood sugar 1 time a day-E11.22. 11/11/23   Webb, Padonda B, FNP  OZEMPIC , 2 MG/DOSE, 8 MG/3ML SOPN INJECT TWO MG INTO THE SKIN ONCE A WEEK 11/30/23   Drubel, Heidi Llamas, PA-C  phentermine  (ADIPEX-P ) 37.5 MG tablet TAKE 1 TABLET BY MOUTH DAILY BEFORE BREAKFAST 11/11/23   Trenton Frock, PA-C  topiramate  (TOPAMAX ) 50 MG tablet TAKE 1 TABLET BY MOUTH TWICE A DAY 06/14/23   Wilkinson, Dana E, FNP  valsartan  (DIOVAN ) 160 MG tablet TAKE 1 TABLET DAILY FOR BP & DIABETIC KIDNEY PROTECTION 12/01/23   Trenton Frock, PA-C  Allergies    Mounjaro  [tirzepatide ] and Sulfamethoxazole-trimethoprim    Review of Systems   Review of Systems  Gastrointestinal:  Positive for hematochezia.    Physical Exam Updated Vital Signs BP 124/73 (BP Location: Right Arm)   Pulse (!) 56   Temp 97.9 F (36.6 C) (Oral)   Resp 16   SpO2 96%  Physical Exam Vitals and nursing note reviewed.  Constitutional:      Appearance: He is well-developed.  HENT:     Head: Normocephalic.  Cardiovascular:     Rate and Rhythm: Normal rate and regular rhythm.     Heart sounds: No murmur heard. Pulmonary:     Effort: Pulmonary  effort is normal.     Breath sounds: Normal breath sounds. No wheezing, rhonchi or rales.  Abdominal:     General: Bowel sounds are normal.     Palpations: Abdomen is soft.     Tenderness: There is no abdominal tenderness. There is no guarding or rebound.  Genitourinary:    Rectum: Normal.     Comments: Stool from rectal exam appears reddish. No melena. Musculoskeletal:        General: Normal range of motion.     Cervical back: Normal range of motion and neck supple.  Skin:    General: Skin is warm and dry.  Neurological:     General: No focal deficit present.     Mental Status: He is alert and oriented to person, place, and time.     ED Results / Procedures / Treatments   Labs (all labs ordered are listed, but only abnormal results are displayed) Labs Reviewed  CBC WITH DIFFERENTIAL/PLATELET - Abnormal; Notable for the following components:      Result Value   WBC 10.9 (*)    All other components within normal limits  LIPASE, BLOOD - Abnormal; Notable for the following components:   Lipase 56 (*)    All other components within normal limits  COMPREHENSIVE METABOLIC PANEL WITH GFR - Abnormal; Notable for the following components:   CO2 21 (*)    All other components within normal limits  OCCULT BLOOD X 1 CARD TO LAB, STOOL - Abnormal; Notable for the following components:   Fecal Occult Bld POSITIVE (*)    All other components within normal limits   Results for orders placed or performed during the hospital encounter of 02/06/24  CBC with Differential   Collection Time: 02/06/24  5:17 PM  Result Value Ref Range   WBC 10.9 (H) 4.0 - 10.5 K/uL   RBC 5.34 4.22 - 5.81 MIL/uL   Hemoglobin 14.5 13.0 - 17.0 g/dL   HCT 62.1 30.8 - 65.7 %   MCV 83.9 80.0 - 100.0 fL   MCH 27.2 26.0 - 34.0 pg   MCHC 32.4 30.0 - 36.0 g/dL   RDW 84.6 96.2 - 95.2 %   Platelets 284 150 - 400 K/uL   nRBC 0.0 0.0 - 0.2 %   Neutrophils Relative % 67 %   Neutro Abs 7.3 1.7 - 7.7 K/uL   Lymphocytes  Relative 20 %   Lymphs Abs 2.2 0.7 - 4.0 K/uL   Monocytes Relative 9 %   Monocytes Absolute 0.9 0.1 - 1.0 K/uL   Eosinophils Relative 3 %   Eosinophils Absolute 0.4 0.0 - 0.5 K/uL   Basophils Relative 1 %   Basophils Absolute 0.1 0.0 - 0.1 K/uL   Immature Granulocytes 0 %   Abs Immature Granulocytes 0.04 0.00 - 0.07 K/uL  Lipase, blood   Collection Time: 02/06/24  5:17 PM  Result Value Ref Range   Lipase 56 (H) 11 - 51 U/L  Comprehensive metabolic panel   Collection Time: 02/06/24  5:17 PM  Result Value Ref Range   Sodium 143 135 - 145 mmol/L   Potassium 4.6 3.5 - 5.1 mmol/L   Chloride 109 98 - 111 mmol/L   CO2 21 (L) 22 - 32 mmol/L   Glucose, Bld 99 70 - 99 mg/dL   BUN 23 8 - 23 mg/dL   Creatinine, Ser 6.96 0.61 - 1.24 mg/dL   Calcium  9.4 8.9 - 10.3 mg/dL   Total Protein 7.4 6.5 - 8.1 g/dL   Albumin 4.4 3.5 - 5.0 g/dL   AST 35 15 - 41 U/L   ALT 42 0 - 44 U/L   Alkaline Phosphatase 98 38 - 126 U/L   Total Bilirubin 0.3 0.0 - 1.2 mg/dL   GFR, Estimated >29 >52 mL/min   Anion gap 13 5 - 15  Occult blood card to lab, stool   Collection Time: 02/06/24  6:29 PM  Result Value Ref Range   Fecal Occult Bld POSITIVE (A) NEGATIVE     EKG None  Radiology No results found.  Procedures Procedures    Medications Ordered in ED Medications - No data to display  ED Course/ Medical Decision Making/ A&P                                 Medical Decision Making This patient presents to the ED for concern of dark stools, this involves an extensive number of treatment options, and is a complaint that carries with it a high risk of complications and morbidity.  The differential diagnosis includes upper GI bleeding, perforation, hemorrhoids   Co morbidities that complicate the patient evaluation  Recent colonoscopy: HLD, T2DM, HTN, GERD, TIA, arthritis   Additional history obtained:  Additional history and/or information obtained from chart review, notable for Colonoscopy  ([place]) 02/03/24 report: Findings Multiple scattered pancolonic diverticula of mild severity Multiple internal medium hemorrhoids. Prominent anal papillae were noted. The exam was otherwise unremarkable to the cecum.  Impression Scattered diverticulosis of mild severity Medium hemorrhoids The exam was otherwise unremarkable to the cecum.      Lab Tests:  I Ordered, and personally interpreted labs.  The pertinent results include:   CBC: no significant leukocytosis, normal hgb, normal plts Cmet: lipase 59; CO2 21; no other abnormalities Hemoccult:    Imaging Studies ordered:  I ordered imaging studies including n/a I independently visualized and interpreted imaging which showed n/a I agree with the radiologist interpretation   Cardiac Monitoring:  The patient was maintained on a cardiac monitor.  I personally viewed and interpreted the cardiac monitored which showed an underlying rhythm of: n/a     Test Considered:  N/a   Critical Interventions:  N/a   Consultations Obtained:  I requested consultation with the n/a,  and discussed lab and imaging findings as well as pertinent plan - they recommend: n/a   Problem List / ED Course:  Here with concern for melanic stools since colonoscopy 3 days ago. No persistent pain, no fever, no nausea or vomiting Abdominal exam benign in ED, nontender and nondistended. No fever, labs reviewed and are unremarkable.  No symptoms of volume loss He reports he had a bowel movement in the ED that was brown in color.   Reevaluation:  After the interventions noted above, I reevaluated the patient and found that they have :improved   Social Determinants of Health:  Never a smoker   Disposition:  After consideration of the diagnostic results and the patients response to treatment, I feel that the patient would benefit from discharge home. GI follow up this week.   Amount and/or Complexity of Data Reviewed Labs:  ordered.           Final Clinical Impression(s) / ED Diagnoses Final diagnoses:  Rectal bleeding    Rx / DC Orders ED Discharge Orders     None         Rama Burkitt 02/06/24 1845    Tonya Fredrickson, MD 02/07/24 1028

## 2024-02-06 NOTE — Discharge Instructions (Addendum)
 Your exam and labs today are reassuring. You can be discharged home and are encouraged to follow up with your GI doctor tomorrow by calling the office and advising her of your emergency department visit.   If you have any frank rectal bleeding, severe pain, lightheadedness, feeling like you might pass out - please return to the emergency department for further evaluation.

## 2024-02-06 NOTE — ED Triage Notes (Signed)
 Pt reports having a colonoscopy done on 02/03/2024. Also had polyps removed. Reports not "feeling right since". Having dark stools that getter darker since. Endorses diarrhea. Also increased acid reflux.

## 2024-02-11 ENCOUNTER — Other Ambulatory Visit: Payer: Self-pay | Admitting: Physician Assistant

## 2024-02-11 DIAGNOSIS — E1142 Type 2 diabetes mellitus with diabetic polyneuropathy: Secondary | ICD-10-CM

## 2024-02-14 ENCOUNTER — Encounter: Payer: Self-pay | Admitting: *Deleted

## 2024-02-14 ENCOUNTER — Other Ambulatory Visit: Payer: Self-pay | Admitting: Physician Assistant

## 2024-02-14 ENCOUNTER — Telehealth: Payer: Self-pay

## 2024-02-14 ENCOUNTER — Ambulatory Visit (INDEPENDENT_AMBULATORY_CARE_PROVIDER_SITE_OTHER): Payer: PPO | Admitting: Physician Assistant

## 2024-02-14 ENCOUNTER — Encounter: Payer: Self-pay | Admitting: Physician Assistant

## 2024-02-14 ENCOUNTER — Other Ambulatory Visit (HOSPITAL_COMMUNITY): Payer: Self-pay

## 2024-02-14 VITALS — BP 121/71 | HR 55 | Ht 70.5 in | Wt 300.6 lb

## 2024-02-14 DIAGNOSIS — E785 Hyperlipidemia, unspecified: Secondary | ICD-10-CM | POA: Diagnosis not present

## 2024-02-14 DIAGNOSIS — E1159 Type 2 diabetes mellitus with other circulatory complications: Secondary | ICD-10-CM

## 2024-02-14 DIAGNOSIS — I152 Hypertension secondary to endocrine disorders: Secondary | ICD-10-CM | POA: Diagnosis not present

## 2024-02-14 DIAGNOSIS — E1169 Type 2 diabetes mellitus with other specified complication: Secondary | ICD-10-CM

## 2024-02-14 DIAGNOSIS — Z7985 Long-term (current) use of injectable non-insulin antidiabetic drugs: Secondary | ICD-10-CM | POA: Diagnosis not present

## 2024-02-14 DIAGNOSIS — N401 Enlarged prostate with lower urinary tract symptoms: Secondary | ICD-10-CM

## 2024-02-14 LAB — HEMOGLOBIN A1C: Hgb A1c MFr Bld: 6.4 % (ref 4.6–6.5)

## 2024-02-14 LAB — CBC WITH DIFFERENTIAL/PLATELET
Basophils Absolute: 0.1 10*3/uL (ref 0.0–0.1)
Basophils Relative: 0.9 % (ref 0.0–3.0)
Eosinophils Absolute: 0.3 10*3/uL (ref 0.0–0.7)
Eosinophils Relative: 3.1 % (ref 0.0–5.0)
HCT: 42 % (ref 39.0–52.0)
Hemoglobin: 14.2 g/dL (ref 13.0–17.0)
Lymphocytes Relative: 19 % (ref 12.0–46.0)
Lymphs Abs: 1.9 10*3/uL (ref 0.7–4.0)
MCHC: 33.9 g/dL (ref 30.0–36.0)
MCV: 81.6 fl (ref 78.0–100.0)
Monocytes Absolute: 1 10*3/uL (ref 0.1–1.0)
Monocytes Relative: 9.6 % (ref 3.0–12.0)
Neutro Abs: 6.7 10*3/uL (ref 1.4–7.7)
Neutrophils Relative %: 67.4 % (ref 43.0–77.0)
Platelets: 264 10*3/uL (ref 150.0–400.0)
RBC: 5.14 Mil/uL (ref 4.22–5.81)
RDW: 14.8 % (ref 11.5–15.5)
WBC: 9.9 10*3/uL (ref 4.0–10.5)

## 2024-02-14 LAB — LIPID PANEL
Cholesterol: 148 mg/dL (ref 0–200)
HDL: 37.2 mg/dL — ABNORMAL LOW (ref >39.00)
LDL Cholesterol: 57 mg/dL (ref 0–99)
NonHDL: 110.77
Total CHOL/HDL Ratio: 4
Triglycerides: 269 mg/dL — ABNORMAL HIGH (ref 0.0–149.0)
VLDL: 53.8 mg/dL — ABNORMAL HIGH (ref 0.0–40.0)

## 2024-02-14 LAB — PSA: PSA: 0.37 ng/mL (ref 0.10–4.00)

## 2024-02-14 MED ORDER — TIRZEPATIDE 5 MG/0.5ML ~~LOC~~ SOAJ
5.0000 mg | SUBCUTANEOUS | 1 refills | Status: DC
Start: 2024-02-14 — End: 2024-05-09

## 2024-02-14 NOTE — Assessment & Plan Note (Signed)
Repeat psa

## 2024-02-14 NOTE — Progress Notes (Signed)
 Established patient visit   Patient: Dustin Ayala   DOB: March 15, 1956   68 y.o. Male  MRN: [id number] Visit Date: 02/14/2024  Today's healthcare provider: Trenton Frock, PA-C   Cc. Chronic care f/u  Subjective     Pt was in th ED 5/26 for dark stool x 2 days, h/o of colonoscopy 5/23. Pt reports his stool is now a normal color, denies blood in stool, all symptoms resolved.  Diabetes Mellitus Type II, Follow-up  Lab Results  Component Value Date   HGBA1C 6.4 02/14/2024   HGBA1C 6.8 (H) 07/28/2023   HGBA1C 6.5 (H) 03/28/2023   Wt Readings from Last 3 Encounters:  02/14/24 (!) 300 lb 9.6 oz (136.4 kg)  09/21/23 299 lb (135.6 kg)  08/16/23 (!) 313 lb (142 kg)   Current insulin  regiment:none Most Recent Eye Exam: overdue  Pertinent Labs: Lab Results  Component Value Date   CHOL 148 02/14/2024   HDL 37.20 (L) 02/14/2024   LDLCALC 57 02/14/2024   TRIG 269.0 (H) 02/14/2024   CHOLHDL 4 02/14/2024   Lab Results  Component Value Date   NA 143 02/06/2024   K 4.6 02/06/2024   CREATININE 1.21 02/06/2024   GFRNONAA >60 02/06/2024   MICRALBCREAT NOTE 03/28/2023     ---------------------------------------------------------------------------------------------------   Medications: Outpatient Medications Prior to Visit  Medication Sig   atorvastatin  (LIPITOR) 80 MG tablet Take 1 tablet (80 mg total) by mouth daily.   betamethasone  dipropionate 0.05 % cream Apply topically 2 (two) times daily.   blood glucose meter kit and supplies KIT Dispense based on patient and insurance preference. Use up to four times daily as directed.   carvedilol  (COREG ) 12.5 MG tablet Take 12.5 mg by mouth once.   cetirizine  (ZYRTEC  ALLERGY) 10 MG tablet Take 1 tablet (10 mg total) by mouth daily.   Cholecalciferol 125 MCG (5000 UT) TABS Take 5,000 Units by mouth daily.    clopidogrel  (PLAVIX ) 75 MG tablet TAKE 1 TABLET BY MOUTH EVERY DAY   Coenzyme Q10 200 MG capsule Take 200 mg by mouth daily  before breakfast.   ezetimibe  (ZETIA ) 10 MG tablet Take 1 tablet (10 mg total) by mouth daily.   Ferrous Sulfate (IRON) 325 (65 Fe) MG TABS Take by mouth.   gabapentin  (NEURONTIN ) 100 MG capsule TAKE 1 CAPSULE (100 MG TOTAL) BY MOUTH THREE TIMES DAILY.   glucose blood (ONETOUCH VERIO) test strip Check blood sugar 1 time a day-E11.22.   Lancets (ONETOUCH ULTRASOFT) lancets Use as instructed   Multiple Vitamin (MULTIVITAMIN) tablet Take 1 tablet by mouth daily.   omeprazole  (PRILOSEC) 40 MG capsule TAKE 1 CAPSULE DAILY TO PREVENT HEART BURN & INDIGESTION   OneTouch Delica Lancets 33G MISC Check blood sugar 1 time a day-E11.22.   topiramate  (TOPAMAX ) 50 MG tablet TAKE 1 TABLET BY MOUTH TWICE A DAY   valsartan  (DIOVAN ) 160 MG tablet TAKE 1 TABLET DAILY FOR BP & DIABETIC KIDNEY PROTECTION   [DISCONTINUED] OZEMPIC , 2 MG/DOSE, 8 MG/3ML SOPN Inject 2 mg into the skin once a week.   [DISCONTINUED] phentermine  (ADIPEX-P ) 37.5 MG tablet TAKE 1 TABLET BY MOUTH DAILY BEFORE BREAKFAST   No facility-administered medications prior to visit.    Review of Systems  Constitutional:  Negative for fatigue and fever.  Respiratory:  Negative for cough and shortness of breath.   Cardiovascular:  Negative for chest pain, palpitations and leg swelling.  Neurological:  Negative for dizziness and headaches.       Objective  BP 121/71   Pulse (!) 55   Ht 5' 10.5" (1.791 m)   Wt (!) 300 lb 9.6 oz (136.4 kg)   BMI 42.52 kg/m    Physical Exam Constitutional:      General: He is awake.     Appearance: He is well-developed.  HENT:     Head: Normocephalic.  Eyes:     Conjunctiva/sclera: Conjunctivae normal.  Cardiovascular:     Rate and Rhythm: Normal rate and regular rhythm.     Pulses:          Dorsalis pedis pulses are 3+ on the right side and 3+ on the left side.       Posterior tibial pulses are 3+ on the right side and 3+ on the left side.     Heart sounds: Normal heart sounds.  Pulmonary:      Effort: Pulmonary effort is normal.     Breath sounds: Normal breath sounds.  Feet:     Right foot:     Protective Sensation: 4 sites tested.  4 sites sensed.     Skin integrity: Skin integrity normal.     Toenail Condition: Right toenails are normal.     Left foot:     Protective Sensation: 4 sites tested.  4 sites sensed.     Skin integrity: Skin integrity normal.     Toenail Condition: Left toenails are normal.  Skin:    General: Skin is warm.  Neurological:     Mental Status: He is alert and oriented to person, place, and time.  Psychiatric:        Attention and Perception: Attention normal.        Mood and Affect: Mood normal.        Speech: Speech normal.        Behavior: Behavior is cooperative.      Results for orders placed or performed in visit on 02/14/24  Lipid panel  Result Value Ref Range   Cholesterol 148 0 - 200 mg/dL   Triglycerides 161.0 (H) 0.0 - 149.0 mg/dL   HDL 96.04 (L) >54.09 mg/dL   VLDL 81.1 (H) 0.0 - 91.4 mg/dL   LDL Cholesterol 57 0 - 99 mg/dL   Total CHOL/HDL Ratio 4    NonHDL 110.77   CBC w/Diff  Result Value Ref Range   WBC 9.9 4.0 - 10.5 K/uL   RBC 5.14 4.22 - 5.81 Mil/uL   Hemoglobin 14.2 13.0 - 17.0 g/dL   HCT 78.2 95.6 - 21.3 %   MCV 81.6 78.0 - 100.0 fl   MCHC 33.9 30.0 - 36.0 g/dL   RDW 08.6 57.8 - 46.9 %   Platelets 264.0 150.0 - 400.0 K/uL   Neutrophils Relative % 67.4 43.0 - 77.0 %   Lymphocytes Relative 19.0 12.0 - 46.0 %   Monocytes Relative 9.6 3.0 - 12.0 %   Eosinophils Relative 3.1 0.0 - 5.0 %   Basophils Relative 0.9 0.0 - 3.0 %   Neutro Abs 6.7 1.4 - 7.7 K/uL   Lymphs Abs 1.9 0.7 - 4.0 K/uL   Monocytes Absolute 1.0 0.1 - 1.0 K/uL   Eosinophils Absolute 0.3 0.0 - 0.7 K/uL   Basophils Absolute 0.1 0.0 - 0.1 K/uL  HgB A1c  Result Value Ref Range   Hgb A1c MFr Bld 6.4 4.6 - 6.5 %  PSA  Result Value Ref Range   PSA 0.37 0.10 - 4.00 ng/mL  HM COLONOSCOPY  Result Value Ref Range   HM  Colonoscopy See Report (in chart)  See Report (in chart), Patient Reported    Assessment & Plan    Benign prostatic hyperplasia with lower urinary tract symptoms, symptom details unspecified Assessment & Plan: Repeat psa  Orders: -     PSA  Hypertension associated with diabetes (HCC) Assessment & Plan: Chronic, well controlled. Cont coreg  12.5 mg and valsartan  160 mg  Order cmp F/u 6 mo   Type 2 diabetes mellitus with hyperlipidemia [facility]) Assessment & Plan: Lab Results  Component Value Date   HGBA1C 6.4 02/14/2024   HGBA1C 6.8 (H) 07/28/2023   HGBA1C 6.5 (H) 03/28/2023   Well controlled with ozempic  2 mg. Pt desires better weight management. Historically had tried mounjaro  and had a 'break out' on his face. Pt reports this occurs intermittently, even without mounjaro  and he is unsure if they were related. Willing to try again.   Will start mounjaro  5 mg with plans to titrate up as tolerated.  Uacr utd. On ace/arb, statin. Foot exam completed today. F/b 4 weeks  Orders: -     Lipid panel -     CBC with Differential/Platelet -     Hemoglobin A1c -     Ambulatory referral to Ophthalmology -     Tirzepatide ; Inject 5 mg into the skin once a week.  Dispense: 2 mL; Refill: 1  Morbid obesity (HCC) Assessment & Plan: On topamax  50 mg BID. Was also taking phentermine , advised we do not continue > 6 mo in a row. Pt reports it was not effective even when he was taking Will trial on moujaro, see DM note    Pt declines all vaccines today.  Return in about 4 weeks (around 03/13/2024) for weight Management.       Trenton Frock, PA-C  [facility] [phone] (phone) [phone] (fax)  [facility]

## 2024-02-14 NOTE — Assessment & Plan Note (Signed)
 Lab Results  Component Value Date   HGBA1C 6.4 02/14/2024   HGBA1C 6.8 (H) 07/28/2023   HGBA1C 6.5 (H) 03/28/2023   Well controlled with ozempic  2 mg. Pt desires better weight management. Historically had tried mounjaro  and had a 'break out' on his face. Pt reports this occurs intermittently, even without mounjaro  and he is unsure if they were related. Willing to try again.   Will start mounjaro  5 mg with plans to titrate up as tolerated.  Uacr utd. On ace/arb, statin. Foot exam completed today. F/b 4 weeks

## 2024-02-14 NOTE — Telephone Encounter (Signed)
 Pharmacy Patient Advocate Encounter   Received notification from CoverMyMeds that prior authorization for Mounjaro  5 is required/requested.   Insurance verification completed.   The patient is insured through [facility] ADVANTAGE/RX ADVANCE .   Per test claim: PA required and submitted KEY/EOC/Request #: [id number] from 02/14/24 to 02/13/25. Ran test claim, Copay is $0.00. This test claim was processed through [facility]- copay amounts may vary at other pharmacies due to pharmacy/plan contracts, or as the patient moves through the different stages of their insurance plan.

## 2024-02-14 NOTE — Assessment & Plan Note (Signed)
 On topamax  50 mg BID. Was also taking phentermine , advised we do not continue > 6 mo in a row. Pt reports it was not effective even when he was taking Will trial on moujaro, see DM note

## 2024-02-14 NOTE — Assessment & Plan Note (Signed)
 Chronic, well controlled. Cont coreg  12.5 mg and valsartan  160 mg  Order cmp F/u 6 mo

## 2024-02-15 ENCOUNTER — Ambulatory Visit: Payer: Self-pay | Admitting: Physician Assistant

## 2024-02-15 NOTE — Telephone Encounter (Signed)
 Pharmacy notified.

## 2024-02-15 NOTE — Telephone Encounter (Signed)
 Please see pharmacy comment

## 2024-03-27 ENCOUNTER — Encounter: Payer: PPO | Admitting: Nurse Practitioner

## 2024-04-03 ENCOUNTER — Encounter: Payer: Self-pay | Admitting: Physician Assistant

## 2024-04-03 ENCOUNTER — Other Ambulatory Visit: Payer: Self-pay | Admitting: Physician Assistant

## 2024-04-03 ENCOUNTER — Ambulatory Visit (INDEPENDENT_AMBULATORY_CARE_PROVIDER_SITE_OTHER): Admitting: Physician Assistant

## 2024-04-03 VITALS — BP 112/64 | HR 48 | Ht 70.5 in | Wt 314.0 lb

## 2024-04-03 DIAGNOSIS — G4730 Sleep apnea, unspecified: Secondary | ICD-10-CM

## 2024-04-03 DIAGNOSIS — M545 Low back pain, unspecified: Secondary | ICD-10-CM | POA: Diagnosis not present

## 2024-04-03 DIAGNOSIS — Z79899 Other long term (current) drug therapy: Secondary | ICD-10-CM

## 2024-04-03 MED ORDER — CYCLOBENZAPRINE HCL 10 MG PO TABS: 10.0000 mg | ORAL_TABLET | Freq: Three times a day (TID) | ORAL | 0 refills | Status: AC | PRN

## 2024-04-03 NOTE — Progress Notes (Signed)
 Established patient visit   Patient: Dustin Ayala   DOB: Mar 31, 1956   68 y.o. Male  MRN: [id number] Visit Date: 04/03/2024  Today's healthcare provider: Manuelita Flatness, PA-C   Chief Complaint  Patient presents with   Back Pain    Around 5 weeks   Subjective     Discussed the use of AI scribe software for clinical note transcription with the patient, who gave verbal consent to proceed.  History of Present Illness   Dustin Ayala is a 68 year old male who presents with persistent back pain.  He has experienced back pain for five weeks, beginning after the second dose of Mounjaro . The pain is located on the left side under the ribs and has recently started radiating to the right side. It is described as a sensation of something moving when turning over in bed, causing pain. The pain does not radiate down his leg and is primarily noticeable when lying down and turning over. He has not taken pain medication as the pain is not bothersome during the day but causes discomfort at night.  There are no new bladder symptoms, such as difficulty urinating or hematuria. He stopped Mounjaro  due to the onset of pain but did not experience any relief in symptoms.      Medications: Outpatient Medications Prior to Visit  Medication Sig   atorvastatin  (LIPITOR) 80 MG tablet Take 1 tablet (80 mg total) by mouth daily.   betamethasone  dipropionate 0.05 % cream Apply topically 2 (two) times daily.   blood glucose meter kit and supplies KIT Dispense based on patient and insurance preference. Use up to four times daily as directed.   carvedilol  (COREG ) 12.5 MG tablet Take 12.5 mg by mouth once.   cetirizine  (ZYRTEC  ALLERGY) 10 MG tablet Take 1 tablet (10 mg total) by mouth daily.   Cholecalciferol 125 MCG (5000 UT) TABS Take 5,000 Units by mouth daily.    clopidogrel  (PLAVIX ) 75 MG tablet TAKE 1 TABLET BY MOUTH EVERY DAY   Coenzyme Q10 200 MG capsule Take 200 mg by mouth daily before  breakfast.   ezetimibe  (ZETIA ) 10 MG tablet Take 1 tablet (10 mg total) by mouth daily.   Ferrous Sulfate (IRON) 325 (65 Fe) MG TABS Take by mouth.   gabapentin  (NEURONTIN ) 100 MG capsule TAKE 1 CAPSULE (100 MG TOTAL) BY MOUTH THREE TIMES DAILY.   glucose blood (ONETOUCH VERIO) test strip Check blood sugar 1 time a day-E11.22.   Lancets (ONETOUCH ULTRASOFT) lancets Use as instructed   Multiple Vitamin (MULTIVITAMIN) tablet Take 1 tablet by mouth daily.   omeprazole  (PRILOSEC) 40 MG capsule TAKE 1 CAPSULE DAILY TO PREVENT HEART BURN & INDIGESTION   OneTouch Delica Lancets 33G MISC Check blood sugar 1 time a day-E11.22.   tirzepatide  (MOUNJARO ) 5 MG/0.5ML Pen Inject 5 mg into the skin once a week. (Patient not taking: Reported on 04/03/2024)   topiramate  (TOPAMAX ) 50 MG tablet TAKE 1 TABLET BY MOUTH TWICE A DAY   valsartan  (DIOVAN ) 160 MG tablet TAKE 1 TABLET DAILY FOR BP & DIABETIC KIDNEY PROTECTION   No facility-administered medications prior to visit.    Review of Systems  Constitutional:  Negative for fatigue and fever.  Respiratory:  Negative for cough and shortness of breath.   Cardiovascular:  Negative for chest pain, palpitations and leg swelling.  Neurological:  Negative for dizziness and headaches.       Objective    BP 112/64   Pulse (!) 48  Ht 5' 10.5 (1.791 m)   Wt (!) 314 lb (142.4 kg)   BMI 44.42 kg/m    Physical Exam Vitals reviewed.  Constitutional:      Appearance: He is not ill-appearing.  HENT:     Head: Normocephalic.  Eyes:     Conjunctiva/sclera: Conjunctivae normal.  Cardiovascular:     Rate and Rhythm: Normal rate.  Pulmonary:     Effort: Pulmonary effort is normal. No respiratory distress.  Musculoskeletal:     Comments: No visible rashes, edema, erythema. L mid to low back tender to touch. No Pain with flexion, rotation  Neurological:     Mental Status: He is alert and oriented to person, place, and time.  Psychiatric:        Mood and  Affect: Mood normal.        Behavior: Behavior normal.      No results found for any visits on 04/03/24.  Assessment & Plan    Acute left-sided low back pain without sciatica -     Cyclobenzaprine  HCl; Take 1 tablet (10 mg total) by mouth 3 (three) times daily as needed for muscle spasms.  Dispense: 30 tablet; Refill: 0   Recommend tx as msk, rx flexeril , stretches, heat, ice.  Recommend restart mounjaro  unlikely cause of pain.  If pain persists, despite tx may order renal US  d/t location  Return if symptoms worsen or fail to improve.       Manuelita Flatness, PA-C  [facility] at [facility] [phone] (phone) [phone] (fax)  [facility]

## 2024-04-25 ENCOUNTER — Other Ambulatory Visit: Payer: Self-pay | Admitting: Physician Assistant

## 2024-05-09 ENCOUNTER — Telehealth: Payer: Self-pay

## 2024-05-09 ENCOUNTER — Other Ambulatory Visit: Payer: Self-pay | Admitting: Physician Assistant

## 2024-05-09 DIAGNOSIS — G4733 Obstructive sleep apnea (adult) (pediatric): Secondary | ICD-10-CM

## 2024-05-09 DIAGNOSIS — E1169 Type 2 diabetes mellitus with other specified complication: Secondary | ICD-10-CM

## 2024-05-09 MED ORDER — TIRZEPATIDE 7.5 MG/0.5ML ~~LOC~~ SOAJ: 7.5000 mg | SUBCUTANEOUS | 2 refills | Status: DC

## 2024-05-09 NOTE — Telephone Encounter (Signed)
 Copied from CRM #[id number]. Topic: Clinical - Medication Question >> May 09, 2024  1:12 PM Turkey A wrote: Reason for CRM: Patient called to have a new dosage for Mounjaro  but he is not sure what the increase would be-please call patient.

## 2024-05-10 ENCOUNTER — Other Ambulatory Visit: Payer: Self-pay | Admitting: Physician Assistant

## 2024-05-10 ENCOUNTER — Encounter: Payer: Self-pay | Admitting: Pharmacist

## 2024-05-10 NOTE — Progress Notes (Signed)
 Pharmacy Quality Measure Review  This patient is appearing on a report for being at risk of failing the adherence measure for diabetes medications this calendar year.   Medication: Mounjaro  7.5mg  Last fill date: 04/03/2024 for 28 day supply  Reviewed recent refill history in Dr Annemarie database. Actual last refill date was 05/09/2024 for 84 day supply. Patient has no refills remaining. Next appointment with PCP is not scheduled. His current PCP is moving out of state. Will follow up with patient in 1 to 2 months to make sure he gets assigned to a new provider.   Patient is also due to have yearly eye appointment - reminded him to scheduled appointment ASAP. Patient plans to contact his HTA concierge to get a list of HTA optometrist.     Insurance report was not up to date. No action needed at this time.    Madelin Ray, PharmD Clinical Pharmacist [facility]  Population Health [phone]

## 2024-06-27 ENCOUNTER — Ambulatory Visit: Payer: PPO | Admitting: Nurse Practitioner

## 2024-07-02 ENCOUNTER — Telehealth: Payer: Self-pay | Admitting: Physician Assistant

## 2024-07-02 NOTE — Telephone Encounter (Unsigned)
 Copied from CRM #[id number]. Topic: Clinical - Medication Refill >> Jul 02, 2024  3:14 PM Alfonso HERO wrote: Medication: carvedilol  (COREG ) 12.5 MG tablet  valsartan  (DIOVAN ) 160 MG tablet   Has the patient contacted their pharmacy? Yes (Agent: If no, request that the patient contact the pharmacy for the refill. If patient does not wish to contact the pharmacy document the reason why and proceed with request.) (Agent: If yes, when and what did the pharmacy advise?)  This is the patient's preferred pharmacy:  [id number] [address], [place] - [address] [place] [address] Phone: [phone] Fax: [phone]   Is this the correct pharmacy for this prescription? Yes If no, delete pharmacy and type the correct one.   Has the prescription been filled recently? Yes  Is the patient out of the medication? Yes  Has the patient been seen for an appointment in the last year OR does the patient have an upcoming appointment? Yes  Can we respond through MyChart? Yes  Agent: Please be advised that Rx refills may take up to 3 business days. We ask that you follow-up with your pharmacy.

## 2024-07-04 ENCOUNTER — Other Ambulatory Visit: Payer: Self-pay | Admitting: Internal Medicine

## 2024-07-04 DIAGNOSIS — E782 Mixed hyperlipidemia: Secondary | ICD-10-CM

## 2024-07-04 DIAGNOSIS — E1169 Type 2 diabetes mellitus with other specified complication: Secondary | ICD-10-CM

## 2024-07-04 DIAGNOSIS — Z79899 Other long term (current) drug therapy: Secondary | ICD-10-CM

## 2024-07-04 DIAGNOSIS — I10 Essential (primary) hypertension: Secondary | ICD-10-CM

## 2024-07-27 ENCOUNTER — Ambulatory Visit: Payer: PPO | Admitting: Nurse Practitioner

## 2024-07-31 ENCOUNTER — Telehealth: Payer: Self-pay | Admitting: Physician Assistant

## 2024-07-31 NOTE — Telephone Encounter (Signed)
 Pt's wife dropped off papers to be completed. Placed form in providers tray in fo. Pt asks to be called when forms are ready.

## 2024-08-01 NOTE — Telephone Encounter (Signed)
 Form filled out and placed up front.  Pt aware.

## 2024-08-06 ENCOUNTER — Encounter: Payer: Self-pay | Admitting: Pharmacist

## 2024-08-06 NOTE — Progress Notes (Signed)
 Pharmacy Quality Measure Review  This patient is appearing on a report for being at risk of failing the adherence measure for diabetes medications this calendar year.   Medication: Mounjaro  Last fill date: 05/09/2024 for 84 day supply per adherence report.   Reviewed recent refill history in Dr Annemarie database. Actual last refill date was 07/30/2024 for 84 day supply. Patient has no refills remaining. Next appointment with PCP is 11/2024 - will be transfer of care from previous PCP.    Insurance report was not up to date. No action needed at this time.   Madelin Ray, PharmD Clinical Pharmacist [facility]  Population Health [phone]

## 2024-10-04 ENCOUNTER — Telehealth: Payer: Self-pay | Admitting: *Deleted

## 2024-10-04 ENCOUNTER — Other Ambulatory Visit: Payer: Self-pay | Admitting: Family

## 2024-10-04 ENCOUNTER — Ambulatory Visit

## 2024-10-04 VITALS — Ht 70.5 in | Wt 305.0 lb

## 2024-10-04 DIAGNOSIS — Z0000 Encounter for general adult medical examination without abnormal findings: Secondary | ICD-10-CM

## 2024-10-04 MED ORDER — TIRZEPATIDE 10 MG/0.5ML ~~LOC~~ SOAJ: 10.0000 mg | SUBCUTANEOUS | 1 refills | Status: AC

## 2024-10-04 NOTE — Telephone Encounter (Signed)
 Pt is requesting refill of the next dose of Mounjaro . Currently at 7.5mg  and denies any side effects / new symptoms. He has appt on 11/21/24 to establish care with Harlene GRADE.  Please advise.

## 2024-10-04 NOTE — Patient Instructions (Addendum)
 Dustin Ayala,  Thank you for taking the time for your Medicare Wellness Visit. I appreciate your continued commitment to your health goals. Please review the care plan we discussed, and feel free to reach out if I can assist you further.  Please note that Annual Wellness Visits do not include a physical exam. Some assessments may be limited, especially if the visit was conducted virtually. If needed, we may recommend an in-person follow-up with your provider.  Ongoing Care Seeing your primary care provider every 3 to 6 months helps us  monitor your health and provide consistent, personalized care.   [name], NP:  11/21/24 9am Medicare AWV: 10/07/25 3pm, telephone  Referrals If a referral was made during today's visit and you haven't received any updates within two weeks, please contact the referred provider directly to check on the status.  [facility]:  [phone],  Please call to schedule an appointment soon.   Recommended Screenings: You will need to get the following vaccines at your local pharmacy: Pneumonia, Shingrix (if you change your mind).   Health Maintenance  Topic Date Due   Hepatitis C Screening  Never done   Pneumococcal Vaccine for age over 24 (1 of 2 - PCV) Never done   Zoster (Shingles) Vaccine (1 of 2) Never done   Eye exam for diabetics  03/17/2022   Complete foot exam   03/25/2023   Kidney health urinalysis for diabetes  03/27/2024   COVID-19 Vaccine (4 - 2025-26 season) 05/14/2024   Hemoglobin A1C  08/15/2024   Flu Shot  12/11/2024*   Yearly kidney function blood test for diabetes  02/05/2025   Medicare Annual Wellness Visit  10/04/2025   Colon Cancer Screening  02/03/2027   DTaP/Tdap/Td vaccine (2 - Td or Tdap) 02/06/2030   Meningitis B Vaccine  Aged Out  *Topic was postponed. The date shown is not the original due date.       10/04/2024    3:10 PM  Advanced Directives  Does Patient Have a Medical Advance Directive? No  Would patient like  information on creating a medical advance directive? Yes (MAU/Ambulatory/Procedural Areas - Information given)  Please let me know if you do not receive your Advanced Directive Packet in the mail within 1 week. Once completed and notarized, you may return a copy of your Advanced Directive(s) by either of the following:  Bring a copy of your health care power of attorney and living will to the office to be added to your chart at your convenience. You can also mail a copy to [facility] [address] [place], [place] [address] or email to [email or web address]    Vision: Annual vision screenings are recommended for early detection of glaucoma, cataracts, and diabetic retinopathy. These exams can also reveal signs of chronic conditions such as diabetes and high blood pressure.  Dental: Annual dental screenings help detect early signs of oral cancer, gum disease, and other conditions linked to overall health, including heart disease and diabetes.  Please see the attached documents for additional preventive care recommendations.

## 2024-10-04 NOTE — Progress Notes (Addendum)
 "  Please attest this visit in the absence of patient primary care provider.   Chief Complaint  Patient presents with   Medicare Wellness     Subjective:   Dustin Ayala is a 69 y.o. male who presents for a Medicare Annual Wellness Visit.  Visit info / Clinical Intake: Medicare Wellness Visit Type:: Subsequent Annual Wellness Visit Persons participating in visit and providing information:: patient Medicare Wellness Visit Mode:: Telephone If telephone:: video declined Since this visit was completed virtually, some vitals may be partially provided or unavailable. Missing vitals are due to the limitations of the virtual format.: Unable to obtain vitals - no equipment If Telephone or Video please confirm:: I connected with patient using audio/video enable telemedicine. I verified patient identity with two identifiers, discussed telehealth limitations, and patient agreed to proceed. Patient Location:: home Provider Location:: office Interpreter Needed?: No Pre-visit prep was completed: yes AWV questionnaire completed by patient prior to visit?: no Living arrangements:: lives with spouse/significant other Patient's Overall Health Status Rating: good Typical amount of pain: some Does pain affect daily life?: no Are you currently prescribed opioids?: no  Dietary Habits and Nutritional Risks How many meals a day?: 2 Eats fruit and vegetables daily?: (!) no Most meals are obtained by: preparing own meals In the last 2 weeks, have you had any of the following?: none Diabetic:: no  Functional Status Activities of Daily Living (to include ambulation/medication): Independent Ambulation: Independent Medication Administration: Independent Home Management (perform basic housework or laundry): Independent Manage your own finances?: yes Primary transportation is: driving Concerns about vision?: no *vision screening is required for WTM* (Past due, will reach out to [facility]) Concerns  about hearing?: no  Fall Screening Falls in the past year?: 0 Number of falls in past year: 0 Was there an injury with Fall?: 0 Fall Risk Category Calculator: 0 Patient Fall Risk Level: Low Fall Risk  Fall Risk Patient at Risk for Falls Due to: No Fall Risks Fall risk Follow up: Falls evaluation completed  Home and Transportation Safety: All rugs have non-skid backing?: yes All stairs or steps have railings?: yes Grab bars in the bathtub or shower?: yes (one is walk-in, both have hand rails) Have non-skid surface in bathtub or shower?: yes Good home lighting?: yes Regular seat belt use?: yes Hospital stays in the last year:: no  Cognitive Assessment Difficulty concentrating, remembering, or making decisions? : no Will 6CIT or Mini Cog be Completed: yes What year is it?: 0 points What month is it?: 0 points Give patient an address phrase to remember (5 components): [address] [place]  About what time is it?: 0 points Count backwards from 20 to 1: 0 points Say the months of the year in reverse: 2 points Repeat the address phrase from earlier: 0 points 6 CIT Score: 2 points  Advance Directives (For Healthcare) Does Patient Have a Medical Advance Directive?: No Would patient like information on creating a medical advance directive?: Yes (MAU/Ambulatory/Procedural Areas - Information given)  Reviewed/Updated  Reviewed/Updated: Reviewed All (Medical, Surgical, Family, Medications, Allergies, Care Teams, Patient Goals)    Allergies (verified) Sulfamethoxazole-trimethoprim   Current Medications (verified) Outpatient Encounter Medications as of 10/04/2024  Medication Sig   atorvastatin  (LIPITOR) 80 MG tablet TAKE 1 TABLET BY MOUTH EVERY DAY   betamethasone  dipropionate 0.05 % cream Apply topically 2 (two) times daily.   carvedilol  (COREG ) 12.5 MG tablet TAKE 1 TABLET BY MOUTH 2 X /DAY (EVERY 12 HOURS) FOR BP & HEART  cetirizine  (ZYRTEC  ALLERGY) 10 MG  tablet Take 1 tablet (10 mg total) by mouth daily.   Cholecalciferol 125 MCG (5000 UT) TABS Take 5,000 Units by mouth daily.    clopidogrel  (PLAVIX ) 75 MG tablet TAKE 1 TABLET BY MOUTH EVERY DAY   Coenzyme Q10 200 MG capsule Take 200 mg by mouth daily before breakfast.   cyclobenzaprine  (FLEXERIL ) 10 MG tablet Take 1 tablet (10 mg total) by mouth 3 (three) times daily as needed for muscle spasms.   ezetimibe  (ZETIA ) 10 MG tablet TAKE 1 TABLET BY MOUTH EVERY DAY   Ferrous Sulfate (IRON) 325 (65 Fe) MG TABS Take by mouth.   gabapentin  (NEURONTIN ) 100 MG capsule TAKE 1 CAPSULE (100 MG TOTAL) BY MOUTH THREE TIMES DAILY. (Patient taking differently: Take 3 at bedtime)   glucose blood (ONETOUCH VERIO) test strip Check blood sugar 1 time a day-E11.22.   Lancets (ONETOUCH ULTRASOFT) lancets Use as instructed   Multiple Vitamin (MULTIVITAMIN) tablet Take 1 tablet by mouth daily.   omeprazole  (PRILOSEC) 40 MG capsule TAKE 1 CAPSULE DAILY TO PREVENT HEART BURN & INDIGESTION   OneTouch Delica Lancets 33G MISC Check blood sugar 1 time a day-E11.22.   tirzepatide  (MOUNJARO ) 7.5 MG/0.5ML Pen Inject 7.5 mg into the skin once a week.   topiramate  (TOPAMAX ) 50 MG tablet TAKE 1 TABLET BY MOUTH TWICE A DAY   valsartan  (DIOVAN ) 160 MG tablet TAKE 1 TABLET DAILY FOR BP & DIABETIC KIDNEY PROTECTION   blood glucose meter kit and supplies KIT Dispense based on patient and insurance preference. Use up to four times daily as directed.   No facility-administered encounter medications on file as of 10/04/2024.    History: Past Medical History:  Diagnosis Date   Aortic atherosclerosis    Arthritis    GERD (gastroesophageal reflux disease)    Hepatic steatosis 11/15/2019   Hepatomegaly 11/15/2019   Hypertension    Microcytic anemia    Morbid obesity (HCC)    Sleep apnea    no cpap   TIA (transient ischemic attack) 2017   Type 2 diabetes mellitus with hyperlipidemia (HCC)    Vitamin D  deficiency    Past  Surgical History:  Procedure Laterality Date   COLONOSCOPY  2010   no family history   JOINT REPLACEMENT     Knee and both shoulders   REPLACEMENT TOTAL KNEE Left    BACK IN 2010   TONSILLECTOMY     TOTAL SHOULDER ARTHROPLASTY Left 02/07/2020   Procedure: REVERSE TOTAL SHOULDER ARTHROPLASTY;  Surgeon: Dozier Soulier, MD;  Location: [facility];  Service: Orthopedics;  Laterality: Left;   TOTAL SHOULDER REPLACEMENT Right    8.21.2018   Family History  Problem Relation Age of Onset   COPD Mother    Cancer Mother        basal   Hypertension Mother    Hyperlipidemia Mother    Osteoporosis Mother    Arthritis Mother    Prostate cancer Father    Cancer Father 73       stomach CA   Diabetes Father    Hypertension Father    Hyperlipidemia Father    Colon cancer Father    Heart disease Father    Obesity Father    COPD Sister    Depression Sister    Crohn's disease Son    Esophageal cancer Neg Hx    Pancreatic cancer Neg Hx    Stomach cancer Neg Hx    Social History   Occupational History  Not on file  Tobacco Use   Smoking status: Never   Smokeless tobacco: Never  Vaping Use   Vaping status: Never Used  Substance and Sexual Activity   Alcohol use: Not Currently   Drug use: Never   Sexual activity: Not Currently   Tobacco Counseling Counseling given: Not Answered  SDOH Screenings   Food Insecurity: No Food Insecurity (10/04/2024)  Housing: Low Risk (10/04/2024)  Transportation Needs: No Transportation Needs (10/04/2024)  Utilities: Not At Risk (10/04/2024)  Depression (PHQ2-9): Low Risk (10/04/2024)  Financial Resource Strain: Low Risk (04/02/2024)  Physical Activity: Sufficiently Active (10/04/2024)  Social Connections: Moderately Isolated (10/04/2024)  Stress: No Stress Concern Present (10/04/2024)  Tobacco Use: Low Risk (10/04/2024)   See flowsheets for full screening details  Depression Screen PHQ 2 & 9 Depression Scale- Over the past 2 weeks, how often have you  been bothered by any of the following problems? Little interest or pleasure in doing things: 0 Feeling down, depressed, or hopeless (PHQ Adolescent also includes...irritable): 0 PHQ-2 Total Score: 0 Trouble falling or staying asleep, or sleeping too much: 3 (bathroom trips, shoulder pains) Feeling tired or having little energy: 0 Poor appetite or overeating (PHQ Adolescent also includes...weight loss): 0 Feeling bad about yourself - or that you are a failure or have let yourself or your family down: 0 Trouble concentrating on things, such as reading the newspaper or watching television (PHQ Adolescent also includes...like school work): 0 Moving or speaking so slowly that other people could have noticed. Or the opposite - being so fidgety or restless that you have been moving around a lot more than usual: 0 Thoughts that you would be better off dead, or of hurting yourself in some way: 0 PHQ-9 Total Score: 3 If you checked off any problems, how difficult have these problems made it for you to do your work, take care of things at home, or get along with other people?: Not difficult at all  Depression Treatment Depression Interventions/Treatment : EYV7-0 Score <4 Follow-up Not Indicated     Goals Addressed   None          Objective:    Today's Vitals   10/04/24 1502  Weight: (!) 305 lb (138.3 kg)  Height: 5' 10.5 (1.791 m)   Body mass index is 43.14 kg/m.  Hearing/Vision screen No results found. Immunizations and Health Maintenance Health Maintenance  Topic Date Due   Hepatitis C Screening  Never done   Pneumococcal Vaccine: 50+ Years (1 of 2 - PCV) Never done   Zoster Vaccines- Shingrix (1 of 2) Never done   OPHTHALMOLOGY EXAM  03/17/2022   FOOT EXAM  03/25/2023   Diabetic kidney evaluation - Urine ACR  03/27/2024   COVID-19 Vaccine (4 - 2025-26 season) 05/14/2024   HEMOGLOBIN A1C  08/15/2024   Influenza Vaccine  12/11/2024 (Originally 04/13/2024)   Diabetic kidney  evaluation - eGFR measurement  02/05/2025   Medicare Annual Wellness (AWV)  10/04/2025   Colonoscopy  02/03/2027   DTaP/Tdap/Td (2 - Td or Tdap) 02/06/2030   Meningococcal B Vaccine  Aged Out        Assessment/Plan:  This is a routine wellness examination for Dustin Ayala.  Patient Care Team: Cyndi Shaver, PA-C (Inactive) as PCP - General (Physician Assistant) Quinn Odor, [facility] (Inactive) as Pharmacist (Pharmacist) Marvis Ronal BIRCH., MD as Referring Physician (Gastroenterology) [facility], [facility] (Specialist) Dittmer, Odella Jansky, PA-C (Otolaryngology)  I have personally reviewed and noted the following in the patients chart:   Medical  and social history Use of alcohol, tobacco or illicit drugs  Current medications and supplements including opioid prescriptions. Functional ability and status Nutritional status Physical activity Advanced directives List of other physicians Hospitalizations, surgeries, and ER visits in previous 12 months Vitals Screenings to include cognitive, depression, and falls Referrals and appointments  No orders of the defined types were placed in this encounter.  In addition, I have reviewed and discussed with patient certain preventive protocols, quality metrics, and best practice recommendations. A written personalized care plan for preventive services as well as general preventive health recommendations were provided to patient.   Lolita Libra, CMA   10/04/2024   Return in 1 year (on 10/04/2025).  After Visit Summary: (MyChart) Due to this being a telephonic visit, the after visit summary with patients personalized plan was offered to patient via MyChart   Nurse Notes: HM Addressed: Declines Covid, Flu, Shingles and Pneumonia vaccines. Will scheduled DM eye exam  "

## 2024-10-05 NOTE — Telephone Encounter (Signed)
 Notified pt.

## 2024-11-21 ENCOUNTER — Encounter: Admitting: Student

## 2025-10-07 ENCOUNTER — Ambulatory Visit
